# Patient Record
Sex: Female | Born: 1990 | Race: White | Hispanic: No | Marital: Single | Smoking: Former smoker
Health system: Southern US, Community
[De-identification: ages and names within clinical notes are randomized; demographics above are authoritative.]

## PROBLEM LIST (undated history)

## (undated) ENCOUNTER — Inpatient Hospital Stay (HOSPITAL_COMMUNITY): Payer: Self-pay

## (undated) DIAGNOSIS — N200 Calculus of kidney: Secondary | ICD-10-CM

## (undated) DIAGNOSIS — F3173 Bipolar disorder, in partial remission, most recent episode manic: Secondary | ICD-10-CM

## (undated) DIAGNOSIS — IMO0002 Reserved for concepts with insufficient information to code with codable children: Secondary | ICD-10-CM

## (undated) DIAGNOSIS — E039 Hypothyroidism, unspecified: Secondary | ICD-10-CM

## (undated) DIAGNOSIS — F32A Depression, unspecified: Secondary | ICD-10-CM

## (undated) DIAGNOSIS — G43909 Migraine, unspecified, not intractable, without status migrainosus: Secondary | ICD-10-CM

## (undated) DIAGNOSIS — K449 Diaphragmatic hernia without obstruction or gangrene: Secondary | ICD-10-CM

## (undated) DIAGNOSIS — J45909 Unspecified asthma, uncomplicated: Secondary | ICD-10-CM

## (undated) DIAGNOSIS — F419 Anxiety disorder, unspecified: Secondary | ICD-10-CM

## (undated) DIAGNOSIS — K219 Gastro-esophageal reflux disease without esophagitis: Secondary | ICD-10-CM

## (undated) DIAGNOSIS — F329 Major depressive disorder, single episode, unspecified: Secondary | ICD-10-CM

## (undated) DIAGNOSIS — T7840XA Allergy, unspecified, initial encounter: Secondary | ICD-10-CM

## (undated) HISTORY — PX: TUBAL LIGATION: SHX77

## (undated) HISTORY — DX: Hypothyroidism, unspecified: E03.9

## (undated) HISTORY — PX: DILATION AND CURETTAGE OF UTERUS: SHX78

## (undated) HISTORY — DX: Diaphragmatic hernia without obstruction or gangrene: K44.9

## (undated) HISTORY — PX: ABDOMINAL HYSTERECTOMY: SUR658

## (undated) HISTORY — DX: Migraine, unspecified, not intractable, without status migrainosus: G43.909

## (undated) HISTORY — DX: Anxiety disorder, unspecified: F41.9

## (undated) HISTORY — DX: Allergy, unspecified, initial encounter: T78.40XA

## (undated) HISTORY — DX: Gastro-esophageal reflux disease without esophagitis: K21.9

## (undated) HISTORY — DX: Bipolar disorder, in partial remission, most recent episode manic: F31.73

## (undated) HISTORY — DX: Reserved for concepts with insufficient information to code with codable children: IMO0002

---

## 2010-06-02 ENCOUNTER — Emergency Department (HOSPITAL_BASED_OUTPATIENT_CLINIC_OR_DEPARTMENT_OTHER)
Admission: EM | Admit: 2010-06-02 | Discharge: 2010-06-02 | Payer: Self-pay | Source: Home / Self Care | Admitting: Emergency Medicine

## 2010-06-02 LAB — URINE MICROSCOPIC-ADD ON

## 2010-06-02 LAB — URINALYSIS, ROUTINE W REFLEX MICROSCOPIC
Hgb urine dipstick: NEGATIVE
Protein, ur: NEGATIVE mg/dL
Specific Gravity, Urine: 1.013 (ref 1.005–1.030)
Urine Glucose, Fasting: NEGATIVE mg/dL
pH: 6.5 (ref 5.0–8.0)

## 2010-09-02 ENCOUNTER — Emergency Department (HOSPITAL_BASED_OUTPATIENT_CLINIC_OR_DEPARTMENT_OTHER)
Admission: EM | Admit: 2010-09-02 | Discharge: 2010-09-03 | Disposition: A | Discharge status: Home / Self Care | Payer: Self-pay | Attending: Emergency Medicine | Admitting: Emergency Medicine

## 2010-09-02 DIAGNOSIS — J45909 Unspecified asthma, uncomplicated: Secondary | ICD-10-CM | POA: Insufficient documentation

## 2010-09-02 DIAGNOSIS — N760 Acute vaginitis: Secondary | ICD-10-CM | POA: Insufficient documentation

## 2010-09-03 LAB — URINALYSIS, ROUTINE W REFLEX MICROSCOPIC
Ketones, ur: NEGATIVE mg/dL
Nitrite: NEGATIVE
Protein, ur: NEGATIVE mg/dL
Urobilinogen, UA: 0.2 mg/dL (ref 0.0–1.0)

## 2010-09-03 LAB — WET PREP, GENITAL: Trich, Wet Prep: NONE SEEN

## 2010-09-03 LAB — URINE MICROSCOPIC-ADD ON

## 2010-09-04 LAB — GC/CHLAMYDIA PROBE AMP, GENITAL
Chlamydia, DNA Probe: NEGATIVE
GC Probe Amp, Genital: NEGATIVE

## 2010-12-28 ENCOUNTER — Encounter: Payer: Self-pay | Admitting: Student

## 2010-12-28 ENCOUNTER — Emergency Department (HOSPITAL_BASED_OUTPATIENT_CLINIC_OR_DEPARTMENT_OTHER)
Admission: EM | Admit: 2010-12-28 | Discharge: 2010-12-28 | Disposition: A | Discharge status: Home / Self Care | Payer: Self-pay | Attending: Emergency Medicine | Admitting: Emergency Medicine

## 2010-12-28 DIAGNOSIS — K029 Dental caries, unspecified: Secondary | ICD-10-CM | POA: Insufficient documentation

## 2010-12-28 DIAGNOSIS — K089 Disorder of teeth and supporting structures, unspecified: Secondary | ICD-10-CM | POA: Insufficient documentation

## 2010-12-28 MED ORDER — HYDROCODONE-ACETAMINOPHEN 5-500 MG PO TABS: 2.0000 | ORAL_TABLET | Freq: Four times a day (QID) | ORAL | Status: AC | PRN

## 2010-12-28 MED ORDER — PENICILLIN V POTASSIUM 500 MG PO TABS: 500.0000 mg | ORAL_TABLET | Freq: Four times a day (QID) | ORAL | Status: AC

## 2010-12-28 NOTE — ED Notes (Signed)
toothache

## 2010-12-28 NOTE — ED Notes (Signed)
Pt reports right lower molar pain x2-3days. Taking ibuprofen without relief. Called Dentist and is unable to get an appointment for another few weeks. Denies fever or swelling.

## 2010-12-28 NOTE — ED Notes (Signed)
Toothache to right lower molar

## 2010-12-28 NOTE — ED Provider Notes (Signed)
History     CSN: [id number] Arrival date & time: 12/28/2010  9:56 PM  Chief Complaint  Patient presents with  . Dental Pain   Patient is a 20 y.o. female presenting with tooth pain. The history is provided by the patient. No language interpreter was used.  Dental PainThe primary symptoms include dental injury. The symptoms began 3 to 5 days ago. The symptoms are worsening. The symptoms are new. The symptoms occur constantly.  Additional symptoms include: dental sensitivity to temperature and gum swelling. Additional symptoms do not include: gum tenderness, purulent gums, jaw pain, facial swelling, trouble swallowing, pain with swallowing, excessive salivation, dry mouth, taste disturbance and smell disturbance. Medical issues do not include: alcohol problem and immunosuppression.    History reviewed. No pertinent past medical history.  Past Surgical History  Procedure Date  . Cesarean section     History reviewed. No pertinent family history.  History  Substance Use Topics  . Smoking status: Never Smoker   . Smokeless tobacco: Not on file  . Alcohol Use: No    OB History    Grav Para Term Preterm Abortions TAB SAB Ect Mult Living                  Review of Systems  Constitutional: Negative.   HENT: Negative for facial swelling and trouble swallowing.   Eyes: Negative.   Respiratory: Negative for apnea.   Cardiovascular: Negative for chest pain.  Gastrointestinal: Negative for abdominal distention.  Genitourinary: Negative.   Musculoskeletal: Negative for arthralgias.  Skin: Negative.   Neurological: Negative.   Hematological: Negative.   Psychiatric/Behavioral: Negative.     Physical Exam  BP 118/78  Pulse 65  Temp(Src) 99.3 F (37.4 C) (Oral)  Resp 18  SpO2 100%  LMP 12/26/2010  Physical Exam  Constitutional: She is oriented to person, place, and time. She appears well-developed and well-nourished. No distress.  HENT:  Head: Normocephalic and atraumatic.   Mouth/Throat: Oropharynx is clear and moist. Dental caries present.    Eyes: Pupils are equal, round, and reactive to light.  Neck: Normal range of motion. Neck supple. No tracheal deviation present.  Cardiovascular: Normal rate and regular rhythm.   Pulmonary/Chest: Effort normal and breath sounds normal. No respiratory distress.  Abdominal: Soft. Bowel sounds are normal. She exhibits no distension.  Musculoskeletal: Normal range of motion.  Lymphadenopathy:    She has no cervical adenopathy.  Neurological: She is alert and oriented to person, place, and time.  Skin: Skin is warm and dry.  Psychiatric: She has a normal mood and affect.    ED Course  Procedures  MDM Previous visit reviewed.  Follow up with dentist and use barrier contraception while on antibiotics.  Return for fever chills or facial swelling or inability to swallow or any concerns.  Patient verbalizes understanding     Clete Kuch K Parris Signer-Rasch, MD 12/28/10 2308

## 2012-02-03 ENCOUNTER — Emergency Department (HOSPITAL_BASED_OUTPATIENT_CLINIC_OR_DEPARTMENT_OTHER)
Admission: EM | Admit: 2012-02-03 | Discharge: 2012-02-03 | Disposition: A | Discharge status: Home / Self Care | Payer: Medicaid Other | Attending: Emergency Medicine | Admitting: Emergency Medicine

## 2012-02-03 ENCOUNTER — Emergency Department (HOSPITAL_BASED_OUTPATIENT_CLINIC_OR_DEPARTMENT_OTHER): Payer: Medicaid Other

## 2012-02-03 ENCOUNTER — Encounter (HOSPITAL_BASED_OUTPATIENT_CLINIC_OR_DEPARTMENT_OTHER): Payer: Self-pay | Admitting: *Deleted

## 2012-02-03 DIAGNOSIS — H9209 Otalgia, unspecified ear: Secondary | ICD-10-CM | POA: Insufficient documentation

## 2012-02-03 DIAGNOSIS — J40 Bronchitis, not specified as acute or chronic: Secondary | ICD-10-CM

## 2012-02-03 MED ORDER — BENZONATATE 100 MG PO CAPS: 100.0000 mg | ORAL_CAPSULE | Freq: Three times a day (TID) | ORAL | Status: DC

## 2012-02-03 NOTE — ED Provider Notes (Signed)
History     CSN: [id number]  Arrival date & time 02/03/12  2109   First MD Initiated Contact with Patient 02/03/12 2158      Chief Complaint  Patient presents with  . Otalgia    (Consider location/radiation/quality/duration/timing/severity/associated sxs/prior treatment) HPI Pt reports cough and nasal congestion ongoing for the last several days not improved with over the counter medications. Now associated with L sided ear pain. No drainage from ear. Subjective fever at home. No vomiting or diarrhea.   History reviewed. No pertinent past medical history.  Past Surgical History  Procedure Date  . Cesarean section     No family history on file.  History  Substance Use Topics  . Smoking status: Never Smoker   . Smokeless tobacco: Not on file  . Alcohol Use: No    OB History    Grav Para Term Preterm Abortions TAB SAB Ect Mult Living                  Review of Systems All other systems reviewed and are negative except as noted in HPI.   Allergies  Review of patient's allergies indicates no known allergies.  Home Medications   Current Outpatient Rx  Name Route Sig Dispense Refill  . CALCIUM CARBONATE-VITAMIN D 600-400 MG-UNIT PO TABS Oral Take 1 tablet by mouth daily.      . IBUPROFEN 800 MG PO TABS Oral Take 800 mg by mouth every 8 (eight) hours as needed. pain       BP 130/67  Pulse 96  Temp 97.9 F (36.6 C) (Oral)  Resp 22  SpO2 100%  LMP 01/13/2012  Physical Exam  Nursing note and vitals reviewed. Constitutional: She is oriented to person, place, and time. She appears well-developed and well-nourished.  HENT:  Head: Normocephalic and atraumatic.  Right Ear: Tympanic membrane and ear canal normal.  Left Ear: Tympanic membrane and ear canal normal.  Eyes: EOM are normal. Pupils are equal, round, and reactive to light.  Neck: Normal range of motion. Neck supple.  Cardiovascular: Normal rate, normal heart sounds and intact distal pulses.     Pulmonary/Chest: Effort normal and breath sounds normal.  Abdominal: Bowel sounds are normal. She exhibits no distension. There is no tenderness.  Musculoskeletal: Normal range of motion. She exhibits no edema and no tenderness.  Neurological: She is alert and oriented to person, place, and time. She has normal strength. No cranial nerve deficit or sensory deficit.  Skin: Skin is warm and dry. No rash noted.  Psychiatric: She has a normal mood and affect.    ED Course  Procedures (including critical care time)  Labs Reviewed - No data to display Dg Chest 2 View  02/03/2012  *RADIOLOGY REPORT*  Clinical Data: Coughing congestion  CHEST - 2 VIEW  Comparison: Chest radiograph 01/25 1012  Findings: Normal mediastinum and cardiac silhouette.  Normal pulmonary  vasculature.  No evidence of effusion, infiltrate, or pneumothorax.  No acute bony abnormality.  IMPRESSION: No acute cardiopulmonary process.   Original Report Authenticated By: Genevive Bi, M.D.      No diagnosis found.    MDM  CXR neg as above. Likely a viral bronchitis. Advised to continue with supportive measures. Tessalon perles for cough. PCP followup as needed.         Swigert B. Bernette Mayers, MD 02/03/12 2318

## 2012-02-03 NOTE — ED Notes (Signed)
Earache. Cough and congestion x 10 days.

## 2013-01-11 ENCOUNTER — Encounter (HOSPITAL_BASED_OUTPATIENT_CLINIC_OR_DEPARTMENT_OTHER): Payer: Self-pay | Admitting: Emergency Medicine

## 2013-01-11 ENCOUNTER — Emergency Department (HOSPITAL_BASED_OUTPATIENT_CLINIC_OR_DEPARTMENT_OTHER)
Admission: EM | Admit: 2013-01-11 | Discharge: 2013-01-11 | Disposition: A | Discharge status: Home / Self Care | Payer: Medicaid Other | Attending: Emergency Medicine | Admitting: Emergency Medicine

## 2013-01-11 DIAGNOSIS — R093 Abnormal sputum: Secondary | ICD-10-CM | POA: Insufficient documentation

## 2013-01-11 DIAGNOSIS — Z3202 Encounter for pregnancy test, result negative: Secondary | ICD-10-CM | POA: Insufficient documentation

## 2013-01-11 DIAGNOSIS — Z8659 Personal history of other mental and behavioral disorders: Secondary | ICD-10-CM | POA: Insufficient documentation

## 2013-01-11 DIAGNOSIS — R0982 Postnasal drip: Secondary | ICD-10-CM

## 2013-01-11 DIAGNOSIS — J45909 Unspecified asthma, uncomplicated: Secondary | ICD-10-CM | POA: Insufficient documentation

## 2013-01-11 DIAGNOSIS — J3489 Other specified disorders of nose and nasal sinuses: Secondary | ICD-10-CM | POA: Insufficient documentation

## 2013-01-11 DIAGNOSIS — Z8709 Personal history of other diseases of the respiratory system: Secondary | ICD-10-CM | POA: Insufficient documentation

## 2013-01-11 DIAGNOSIS — R509 Fever, unspecified: Secondary | ICD-10-CM | POA: Insufficient documentation

## 2013-01-11 HISTORY — DX: Depression, unspecified: F32.A

## 2013-01-11 HISTORY — DX: Unspecified asthma, uncomplicated: J45.909

## 2013-01-11 HISTORY — DX: Major depressive disorder, single episode, unspecified: F32.9

## 2013-01-11 LAB — PREGNANCY, URINE: Preg Test, Ur: NEGATIVE

## 2013-01-11 MED ORDER — IBUPROFEN 800 MG PO TABS
800.0000 mg | ORAL_TABLET | Freq: Once | ORAL | Status: AC
  Administered 2013-01-11: 800 mg via ORAL
  Filled 2013-01-11: qty 1

## 2013-01-11 MED ORDER — FLUTICASONE PROPIONATE 50 MCG/ACT NA SUSP: 2.0000 | Freq: Every day | NASAL | Status: DC

## 2013-01-11 MED ORDER — AZITHROMYCIN 250 MG PO TABS: ORAL_TABLET | ORAL | Status: DC

## 2013-01-11 NOTE — ED Provider Notes (Signed)
CSN: [id number]     Arrival date & time 01/11/13  0004 History   First MD Initiated Contact with Patient 01/11/13 0033     Chief Complaint  Patient presents with  . Cough  . Fever   (Consider location/radiation/quality/duration/timing/severity/associated sxs/prior Treatment) Patient is a 22 y.o. female presenting with cough. The history is provided by the patient.  Cough Cough characteristics:  Productive Sputum characteristics:  Yellow Severity:  Mild Onset quality:  Gradual Timing:  Constant Progression:  Unchanged Chronicity:  New Smoker: no   Context: upper respiratory infection   Relieved by:  Nothing Worsened by:  Nothing tried Ineffective treatments:  Cough suppressants Associated symptoms: sinus congestion   Associated symptoms: no shortness of breath and no sore throat   Risk factors: no chemical exposure     Past Medical History  Diagnosis Date  . Depression   . Asthma   . Bronchitis    Past Surgical History  Procedure Laterality Date  . Cesarean section    . Dilation and curettage of uterus     No family history on file. History  Substance Use Topics  . Smoking status: Never Smoker   . Smokeless tobacco: Not on file  . Alcohol Use: No   OB History   Grav Para Term Preterm Abortions TAB SAB Ect Mult Living                 Review of Systems  HENT: Positive for congestion. Negative for sore throat, drooling, neck pain, neck stiffness and voice change.   Respiratory: Positive for cough. Negative for shortness of breath.   All other systems reviewed and are negative.    Allergies  Review of patient's allergies indicates no known allergies.  Home Medications   Current Outpatient Rx  Name  Route  Sig  Dispense  Refill  . benzonatate (TESSALON) 100 MG capsule   Oral   Take 1 capsule (100 mg total) by mouth every 8 (eight) hours.   21 capsule   0   . Calcium Carbonate-Vitamin D (CALTRATE 600+D) 600-400 MG-UNIT per tablet   Oral   Take 1 tablet  by mouth daily.           Marland Kitchen ibuprofen (ADVIL,MOTRIN) 800 MG tablet   Oral   Take 800 mg by mouth every 8 (eight) hours as needed. pain           BP 117/73  Pulse 87  Temp(Src) 98.1 F (36.7 C) (Oral)  Resp 16  Ht 5\' 3"  (1.6 m)  Wt 230 lb (104.327 kg)  BMI 40.75 kg/m2  SpO2 99%  LMP 12/14/2012 Physical Exam  Constitutional: She is oriented to person, place, and time. She appears well-developed and well-nourished.  HENT:  Head: Normocephalic and atraumatic.  Mouth/Throat: No oropharyngeal exudate.  Slightly turbid post nasal drainage with cobblestoning  Eyes: EOM are normal. Pupils are equal, round, and reactive to light.  Neck: Normal range of motion. Neck supple.  Cardiovascular: Normal rate, regular rhythm and intact distal pulses.   Pulmonary/Chest: Effort normal and breath sounds normal. No respiratory distress. She has no wheezes. She has no rales.  Abdominal: Soft. Bowel sounds are normal. There is no tenderness. There is no rebound and no guarding.  Musculoskeletal: Normal range of motion.  Neurological: She is alert and oriented to person, place, and time.  Skin: Skin is warm and dry.  Psychiatric: She has a normal mood and affect.    ED Course  Procedures (including critical care  time) Labs Review Labs Reviewed  PREGNANCY, URINE   Imaging Review No results found.  MDM  No diagnosis found. Will treat for postnasal drip    Jakeline Dave K Emalynn Clewis-Rasch, MD 01/11/13 [phone]

## 2013-01-11 NOTE — ED Notes (Signed)
Productive cough x2 days.  Pt sts she coughs so much it makes her vomit.  Now chest and back hurting.

## 2014-10-10 ENCOUNTER — Encounter (HOSPITAL_BASED_OUTPATIENT_CLINIC_OR_DEPARTMENT_OTHER): Payer: Self-pay | Admitting: *Deleted

## 2014-10-10 ENCOUNTER — Emergency Department (HOSPITAL_BASED_OUTPATIENT_CLINIC_OR_DEPARTMENT_OTHER)
Admission: EM | Admit: 2014-10-10 | Discharge: 2014-10-10 | Disposition: A | Discharge status: Home / Self Care | Payer: Medicaid Other | Attending: Emergency Medicine | Admitting: Emergency Medicine

## 2014-10-10 DIAGNOSIS — J029 Acute pharyngitis, unspecified: Secondary | ICD-10-CM | POA: Diagnosis present

## 2014-10-10 DIAGNOSIS — Z793 Long term (current) use of hormonal contraceptives: Secondary | ICD-10-CM | POA: Diagnosis not present

## 2014-10-10 DIAGNOSIS — Z79899 Other long term (current) drug therapy: Secondary | ICD-10-CM | POA: Diagnosis not present

## 2014-10-10 DIAGNOSIS — Z7952 Long term (current) use of systemic steroids: Secondary | ICD-10-CM | POA: Insufficient documentation

## 2014-10-10 DIAGNOSIS — J45909 Unspecified asthma, uncomplicated: Secondary | ICD-10-CM | POA: Diagnosis not present

## 2014-10-10 DIAGNOSIS — K088 Other specified disorders of teeth and supporting structures: Secondary | ICD-10-CM | POA: Insufficient documentation

## 2014-10-10 DIAGNOSIS — K0889 Other specified disorders of teeth and supporting structures: Secondary | ICD-10-CM

## 2014-10-10 DIAGNOSIS — F329 Major depressive disorder, single episode, unspecified: Secondary | ICD-10-CM | POA: Insufficient documentation

## 2014-10-10 DIAGNOSIS — K029 Dental caries, unspecified: Secondary | ICD-10-CM | POA: Insufficient documentation

## 2014-10-10 MED ORDER — PENICILLIN V POTASSIUM 500 MG PO TABS: 500.0000 mg | ORAL_TABLET | Freq: Three times a day (TID) | ORAL | Status: DC

## 2014-10-10 MED ORDER — HYDROCODONE-ACETAMINOPHEN 5-325 MG PO TABS: 1.0000 | ORAL_TABLET | Freq: Four times a day (QID) | ORAL | Status: DC | PRN

## 2014-10-10 NOTE — ED Provider Notes (Signed)
CSN: [id number]     Arrival date & time 10/10/14  1812 History  This chart was scribed for Geoffery Lyonsouglas Adamae Ricklefs, MD by Bronson CurbJacqueline Melvin, ED Scribe. This patient was seen in room MH04/MH04 and the patient's care was started at 6:46 PM.   Chief Complaint  Patient presents with  . Sore Throat    The history is provided by the patient. No language interpreter was used.     HPI Comments: Tomma LightningBrittney N Mattison is a 24 y.o. female who presents to the Emergency Department complaining of constant, moderate sore throat with associated right sided facial pain that began a few days ago. She has tried 800mg  ibuprofen 2 hours ago without relief. Patient reports history of similar intermittent pain but states this episode is worse. Patient states she has a few decaying teeth, however, she has a dental appointment scheduled for next week and does not believe this pain to be related to her teeth. Patient is currently takes Levothyroxine and Vitamin D. She denies any other symptoms.  Past Medical History  Diagnosis Date  . Depression   . Asthma   . Bronchitis   . Depression    Past Surgical History  Procedure Laterality Date  . Cesarean section    . Dilation and curettage of uterus     No family history on file. History  Substance Use Topics  . Smoking status: Never Smoker   . Smokeless tobacco: Not on file  . Alcohol Use: No   OB History    No data available     Review of Systems  A complete 10 system review of systems was obtained and all systems are negative except as noted in the HPI and PMH.    Allergies  Review of patient's allergies indicates no known allergies.  Home Medications   Prior to Admission medications   Medication Sig Start Date End Date Taking? Authorizing Provider  Desvenlafaxine Succinate (PRISTIQ PO) Take by mouth.   Yes Historical Provider, MD  etonogestrel (NEXPLANON) 68 MG IMPL implant 1 each by Subdermal route once.   Yes Historical Provider, MD  Levothyroxine Sodium  (SYNTHROID PO) Take by mouth.   Yes Historical Provider, MD  azithromycin (ZITHROMAX Z-PAK) 250 MG tablet 2 po day one, then 1 daily x 4 days 01/11/13   April Palumbo, MD  benzonatate (TESSALON) 100 MG capsule Take 1 capsule (100 mg total) by mouth every 8 (eight) hours. 02/03/12   Susy Frizzleharles Sheldon, MD  Calcium Carbonate-Vitamin D (CALTRATE 600+D) 600-400 MG-UNIT per tablet Take 1 tablet by mouth daily.      Historical Provider, MD  fluticasone (FLONASE) 50 MCG/ACT nasal spray Place 2 sprays into the nose daily. 01/11/13   April Palumbo, MD  ibuprofen (ADVIL,MOTRIN) 800 MG tablet Take 800 mg by mouth every 8 (eight) hours as needed. pain     Historical Provider, MD   Triage Vitals: BP 122/88 mmHg  Pulse 90  Temp(Src) 98.8 F (37.1 C) (Oral)  Resp 18  Ht 5\' 3"  (1.6 m)  Wt 218 lb (98.884 kg)  BMI 38.63 kg/m2  SpO2 100%  LMP 10/10/2014  Physical Exam  Constitutional: She is oriented to person, place, and time. She appears well-developed and well-nourished. No distress.  HENT:  Head: Normocephalic and atraumatic.  Mouth/Throat: No dental abscesses.  There is tenderness to the right mandible. There are several decayed teeth with fillings present in the right lower jaw. There is no obvious abscess or significant swelling.  Eyes: Conjunctivae and EOM are normal.  Neck: Neck supple. No tracheal deviation present.  Cardiovascular: Normal rate.   Pulmonary/Chest: Effort normal. No respiratory distress.  Musculoskeletal: Normal range of motion.  Neurological: She is alert and oriented to person, place, and time.  Skin: Skin is warm and dry.  Psychiatric: She has a normal mood and affect. Her behavior is normal.  Nursing note and vitals reviewed.   ED Course  Procedures (including critical care time)  DIAGNOSTIC STUDIES: Oxygen Saturation is 100% on room air, normal by my interpretation.    COORDINATION OF CARE: At 1849 Discussed treatment plan with patient which includes ABX and pain  medication. Patient agrees.   Labs Review Labs Reviewed - No data to display  Imaging Review No results found.   EKG Interpretation None      MDM   Final diagnoses:  None    I suspect a dental etiology to her discomfort. We'll treat with anti-biotics, pain meds, and follow-up with dentistry.  I personally performed the services described in this documentation, which was scribed in my presence. The recorded information has been reviewed and is accurate.      Geoffery Lyons, MD 10/10/14 [phone]

## 2014-10-10 NOTE — Discharge Instructions (Signed)
Penicillin as prescribed.  Hydrocodone as prescribed as needed for pain.  All up with your dentist as scheduled next week, and return to the ER if symptoms significantly worsen or change.   Dental Pain A tooth ache may be caused by cavities (tooth decay). Cavities expose the nerve of the tooth to air and hot or cold temperatures. It may come from an infection or abscess (also called a boil or furuncle) around your tooth. It is also often caused by dental caries (tooth decay). This causes the pain you are having. DIAGNOSIS  Your caregiver can diagnose this problem by exam. TREATMENT   If caused by an infection, it may be treated with medications which kill germs (antibiotics) and pain medications as prescribed by your caregiver. Take medications as directed.  Only take over-the-counter or prescription medicines for pain, discomfort, or fever as directed by your caregiver.  Whether the tooth ache today is caused by infection or dental disease, you should see your dentist as soon as possible for further care. SEEK MEDICAL CARE IF: The exam and treatment you received today has been provided on an emergency basis only. This is not a substitute for complete medical or dental care. If your problem worsens or new problems (symptoms) appear, and you are unable to meet with your dentist, call or return to this location. SEEK IMMEDIATE MEDICAL CARE IF:   You have a fever.  You develop redness and swelling of your face, jaw, or neck.  You are unable to open your mouth.  You have severe pain uncontrolled by pain medicine. MAKE SURE YOU:   Understand these instructions.  Will watch your condition.  Will get help right away if you are not doing well or get worse. Document Released: 04/25/2005 Document Revised: 07/18/2011 Document Reviewed: 12/12/2007 [facility] Patient Information 2015 [employer] [place] [place]. This information is not intended to replace advice given to you by your health care provider.  Make sure you discuss any questions you have with your health care provider.

## 2014-10-10 NOTE — ED Notes (Signed)
Sore throat, facial pain for a week.

## 2014-12-24 DIAGNOSIS — E039 Hypothyroidism, unspecified: Secondary | ICD-10-CM | POA: Insufficient documentation

## 2015-02-13 ENCOUNTER — Emergency Department (HOSPITAL_COMMUNITY)
Admission: EM | Admit: 2015-02-13 | Discharge: 2015-02-14 | Disposition: A | Discharge status: Home / Self Care | Payer: Medicaid Other | Attending: Emergency Medicine | Admitting: Emergency Medicine

## 2015-02-13 ENCOUNTER — Emergency Department (HOSPITAL_COMMUNITY): Payer: Medicaid Other

## 2015-02-13 ENCOUNTER — Encounter (HOSPITAL_COMMUNITY): Payer: Self-pay | Admitting: Emergency Medicine

## 2015-02-13 DIAGNOSIS — Z87442 Personal history of urinary calculi: Secondary | ICD-10-CM | POA: Diagnosis not present

## 2015-02-13 DIAGNOSIS — Z3202 Encounter for pregnancy test, result negative: Secondary | ICD-10-CM | POA: Insufficient documentation

## 2015-02-13 DIAGNOSIS — K8050 Calculus of bile duct without cholangitis or cholecystitis without obstruction: Secondary | ICD-10-CM | POA: Insufficient documentation

## 2015-02-13 DIAGNOSIS — Z79899 Other long term (current) drug therapy: Secondary | ICD-10-CM | POA: Diagnosis not present

## 2015-02-13 DIAGNOSIS — J45909 Unspecified asthma, uncomplicated: Secondary | ICD-10-CM | POA: Diagnosis not present

## 2015-02-13 DIAGNOSIS — K8020 Calculus of gallbladder without cholecystitis without obstruction: Secondary | ICD-10-CM

## 2015-02-13 DIAGNOSIS — Z8659 Personal history of other mental and behavioral disorders: Secondary | ICD-10-CM | POA: Diagnosis not present

## 2015-02-13 DIAGNOSIS — R109 Unspecified abdominal pain: Secondary | ICD-10-CM

## 2015-02-13 DIAGNOSIS — R1011 Right upper quadrant pain: Secondary | ICD-10-CM | POA: Diagnosis present

## 2015-02-13 HISTORY — DX: Calculus of kidney: N20.0

## 2015-02-13 LAB — COMPREHENSIVE METABOLIC PANEL
ALK PHOS: 78 U/L (ref 38–126)
ALT: 14 U/L (ref 14–54)
ANION GAP: 6 (ref 5–15)
AST: 19 U/L (ref 15–41)
Albumin: 4.4 g/dL (ref 3.5–5.0)
BILIRUBIN TOTAL: 0.5 mg/dL (ref 0.3–1.2)
BUN: 19 mg/dL (ref 6–20)
CALCIUM: 9.4 mg/dL (ref 8.9–10.3)
CO2: 25 mmol/L (ref 22–32)
Chloride: 107 mmol/L (ref 101–111)
Creatinine, Ser: 0.81 mg/dL (ref 0.44–1.00)
Glucose, Bld: 84 mg/dL (ref 65–99)
Potassium: 3.9 mmol/L (ref 3.5–5.1)
SODIUM: 138 mmol/L (ref 135–145)
TOTAL PROTEIN: 7.4 g/dL (ref 6.5–8.1)

## 2015-02-13 LAB — URINALYSIS, ROUTINE W REFLEX MICROSCOPIC
BILIRUBIN URINE: NEGATIVE
Glucose, UA: NEGATIVE mg/dL
Ketones, ur: NEGATIVE mg/dL
Leukocytes, UA: NEGATIVE
NITRITE: NEGATIVE
PH: 5.5 (ref 5.0–8.0)
Protein, ur: NEGATIVE mg/dL
SPECIFIC GRAVITY, URINE: 1.034 — AB (ref 1.005–1.030)
Urobilinogen, UA: 0.2 mg/dL (ref 0.0–1.0)

## 2015-02-13 LAB — URINE MICROSCOPIC-ADD ON

## 2015-02-13 LAB — CBC WITH DIFFERENTIAL/PLATELET
Basophils Absolute: 0.1 10*3/uL (ref 0.0–0.1)
Basophils Relative: 1 %
EOS ABS: 0.8 10*3/uL — AB (ref 0.0–0.7)
Eosinophils Relative: 8 %
HCT: 41.8 % (ref 36.0–46.0)
HEMOGLOBIN: 13.6 g/dL (ref 12.0–15.0)
LYMPHS ABS: 3.5 10*3/uL (ref 0.7–4.0)
Lymphocytes Relative: 34 %
MCH: 27.3 pg (ref 26.0–34.0)
MCHC: 32.5 g/dL (ref 30.0–36.0)
MCV: 83.8 fL (ref 78.0–100.0)
MONOS PCT: 7 %
Monocytes Absolute: 0.7 10*3/uL (ref 0.1–1.0)
NEUTROS PCT: 50 %
Neutro Abs: 5.2 10*3/uL (ref 1.7–7.7)
Platelets: 296 10*3/uL (ref 150–400)
RBC: 4.99 MIL/uL (ref 3.87–5.11)
RDW: 14.9 % (ref 11.5–15.5)
WBC: 10.1 10*3/uL (ref 4.0–10.5)

## 2015-02-13 LAB — POC URINE PREG, ED: Preg Test, Ur: NEGATIVE

## 2015-02-13 MED ORDER — SODIUM CHLORIDE 0.9 % IV BOLUS (SEPSIS)
1000.0000 mL | Freq: Once | INTRAVENOUS | Status: AC
  Administered 2015-02-13: 1000 mL via INTRAVENOUS

## 2015-02-13 MED ORDER — ONDANSETRON HCL 4 MG/2ML IJ SOLN
4.0000 mg | Freq: Once | INTRAMUSCULAR | Status: AC
  Administered 2015-02-13: 4 mg via INTRAVENOUS
  Filled 2015-02-13: qty 2

## 2015-02-13 MED ORDER — MORPHINE SULFATE (PF) 4 MG/ML IV SOLN
4.0000 mg | Freq: Once | INTRAVENOUS | Status: AC
  Administered 2015-02-13: 4 mg via INTRAVENOUS
  Filled 2015-02-13: qty 1

## 2015-02-13 NOTE — ED Provider Notes (Signed)
CSN: [id number]     Arrival date & time 02/13/15  2124 History   First MD Initiated Contact with Patient 02/13/15 2204     Chief Complaint  Patient presents with  . Cholelithiasis     (Consider location/radiation/quality/duration/timing/severity/associated sxs/prior Treatment) HPI Comments: 3 weeks ago had pain, "all in back and abdomen" Left lower q and RUQ. Constant pain, stabbing at times. Last 2 years having stomache pain after eating Endoscopy back in April to evaluate Heating pad helps a little Ibuprofen doesn't help Baclofen helps a little [facility] had [place] that showed cholelithiasis Saw OBGYN Cyst L Ovary years ago Nexplanon, sexually active     Past Medical History  Diagnosis Date  . Depression   . Asthma   . Bronchitis   . Depression   . Kidney stones    Past Surgical History  Procedure Laterality Date  . Cesarean section    . Dilation and curettage of uterus     History reviewed. No pertinent family history. Social History  Substance Use Topics  . Smoking status: Never Smoker   . Smokeless tobacco: None  . Alcohol Use: No   OB History    No data available     Review of Systems  Constitutional: Negative for fever.  HENT: Negative for sore throat.   Eyes: Negative for visual disturbance.  Respiratory: Negative for cough and shortness of breath.   Cardiovascular: Negative for chest pain.  Gastrointestinal: Positive for nausea, vomiting (vomiting since Tuesday), abdominal pain and diarrhea (3x/day for 4 days). Negative for constipation and blood in stool.  Genitourinary: Negative for dysuria, vaginal bleeding, vaginal discharge and difficulty urinating.  Musculoskeletal: Negative for back pain and neck pain.  Skin: Negative for rash.  Neurological: Negative for syncope and headaches.      Allergies  Review of patient's allergies indicates no known allergies.  Home Medications   Prior to Admission medications   Medication Sig Start Date End Date  Taking? Authorizing Provider  baclofen (LIORESAL) 5 mg TABS tablet Take 5 mg by mouth 2 (two) times daily as needed for muscle spasms.   Yes Historical Provider, MD  busPIRone (BUSPAR) 7.5 MG tablet Take 7.5 mg by mouth 2 (two) times daily.   Yes Historical Provider, MD  Calcium Carbonate-Vitamin D (CALTRATE 600+D) 600-400 MG-UNIT per tablet Take 1 tablet by mouth daily.     Yes Historical Provider, MD  cholecalciferol (VITAMIN D) 1000 UNITS tablet Take 1,000 Units by mouth daily.   Yes Historical Provider, MD  ibuprofen (ADVIL,MOTRIN) 800 MG tablet Take 800 mg by mouth every 8 (eight) hours as needed for moderate pain.   Yes Historical Provider, MD  levothyroxine (SYNTHROID, LEVOTHROID) 25 MCG tablet Take 25 mcg by mouth daily before breakfast.   Yes Historical Provider, MD  omeprazole (PRILOSEC) 20 MG capsule Take 20 mg by mouth daily.   Yes Historical Provider, MD  saccharomyces boulardii (FLORASTOR) 250 MG capsule Take 250 mg by mouth daily.   Yes Historical Provider, MD  azithromycin (ZITHROMAX Z-PAK) 250 MG tablet 2 po day one, then 1 daily x 4 days Patient not taking: Reported on 02/13/2015 01/11/13   April Palumbo, MD  benzonatate (TESSALON) 100 MG capsule Take 1 capsule (100 mg total) by mouth every 8 (eight) hours. Patient not taking: Reported on 02/13/2015 02/03/12   Susy Frizzle, MD  etonogestrel (NEXPLANON) 68 MG IMPL implant 1 each by Subdermal route continuous.     Historical Provider, MD  fluticasone (FLONASE) 50 MCG/ACT nasal spray  Place 2 sprays into the nose daily. Patient not taking: Reported on 02/13/2015 01/11/13   April Palumbo, MD  HYDROcodone-acetaminophen [facility]) 5-325 MG per tablet Take 1-2 tablets by mouth every 6 (six) hours as needed. Patient not taking: Reported on 02/13/2015 10/10/14   Geoffery Lyons, MD  ibuprofen (ADVIL,MOTRIN) 800 MG tablet Take 800 mg by mouth every 8 (eight) hours as needed. pain     Historical Provider, MD  ondansetron (ZOFRAN ODT) 4 MG disintegrating  tablet Take 1 tablet (4 mg total) by mouth every 8 (eight) hours as needed for nausea or vomiting. 02/14/15   Alvira Monday, MD  penicillin v potassium (VEETID) 500 MG tablet Take 1 tablet (500 mg total) by mouth 3 (three) times daily. Patient not taking: Reported on 02/13/2015 10/10/14   Geoffery Lyons, MD   BP 141/91 mmHg  Pulse 113  Temp(Src) 98.5 F (36.9 C) (Oral)  Resp 20  SpO2 100% Physical Exam  Constitutional: She is oriented to person, place, and time. She appears well-developed and well-nourished. No distress.  HENT:  Head: Normocephalic and atraumatic.  Eyes: Conjunctivae and EOM are normal.  Neck: Normal range of motion.  Cardiovascular: Normal rate, regular rhythm, normal heart sounds and intact distal pulses.  Exam reveals no gallop and no friction rub.   No murmur heard. Pulmonary/Chest: Effort normal and breath sounds normal. No respiratory distress. She has no wheezes. She has no rales.  Abdominal: Soft. She exhibits no distension. There is tenderness in the right upper quadrant. There is CVA tenderness (bilateral). There is no rebound, no guarding, no tenderness at McBurney's point and negative Murphy's sign.  Musculoskeletal: She exhibits no edema or tenderness.  Neurological: She is alert and oriented to person, place, and time.  Skin: Skin is warm and dry. No rash noted. She is not diaphoretic. No erythema.  Nursing note and vitals reviewed.   ED Course  Procedures (including critical care time) Labs Review Labs Reviewed  URINALYSIS, ROUTINE W REFLEX MICROSCOPIC (NOT AT [facility]) - Abnormal; Notable for the following:    APPearance CLOUDY (*)    Specific Gravity, Urine 1.034 (*)    Hgb urine dipstick LARGE (*)    All other components within normal limits  CBC WITH DIFFERENTIAL/PLATELET - Abnormal; Notable for the following:    Eosinophils Absolute 0.8 (*)    All other components within normal limits  URINE CULTURE  COMPREHENSIVE METABOLIC PANEL  URINE  MICROSCOPIC-ADD ON  POC URINE PREG, ED    Imaging Review US Abdomen Limited Ruq  02/13/2015   CLINICAL DATA:  Acute onset of right upper quadrant abdominal pain. Initial encounter.  EXAM: US ABDOMEN LIMITED - RIGHT UPPER QUADRANT  COMPARISON:  CT of the abdomen and pelvis performed 06/24/2014  FINDINGS: Gallbladder:  A single stone is noted within the gallbladder, measuring 6 mm in size. The gallbladder is otherwise unremarkable. There is no evidence of gallbladder wall thickening or pericholecystic fluid. No ultrasonographic Murphy's sign is elicited.  Common bile duct:  Diameter: 0.3 cm, within normal limits in caliber.  Liver:  No focal lesion identified. Within normal limits in parenchymal echogenicity.  IMPRESSION: 1. No acute abnormality seen within the right upper quadrant. 2. Cholelithiasis; gallbladder otherwise unremarkable.   Electronically Signed   By: Roanna Raider M.D.   On: 02/13/2015 23:33   I have personally reviewed and evaluated these images and lab results as part of my medical decision-making.   EKG Interpretation None      MDM  Final diagnoses:  Abdominal pain  Calculus of gallbladder without cholecystitis without obstruction   24 year old female with a history of asthma and nephrolithiasis presents with concern of right upper quadrant abdominal pain with recent ultrasound showing cholelithiasis.  Patient describes history of abdominal pain, however this episode of abdominal pain being present over the last 3 weeks, worse after eating.  She had been referred to OB/GYN initially, and had a pelvic exam done days ago which per patient did not show any acute abnormalities. Given this evaluation have low suspicion for PID, tubo-ovarian abscess, ovarian torsion.  Duration and quality of symptoms and exam not consistent with nephrolithiasis. Urinalysis shows no signs of pyelonephritis.  Ultrasound was performed which showed cholelithiasis without evidence of cholecystitis.  Patient has normal AST, AST, alkaline phosphatase and bili, with no leukocytosis and no signs of cholecystitis on ultrasound. Possible etiologies of pain include symptomatic cholelithiasis, especially given patient's pain worse after eating.   Pt referred to outpatient general surgery clinic for concern for symptomatic cholelithiasis.    Alvira Monday, MD 02/14/15 [phone]

## 2015-02-13 NOTE — ED Notes (Signed)
PT reports to ED c/o abdominal pain due to diagnosed gallstones; gallstones were diagnosed 2 days ago; Pt reports pain and N/V/D when attempting to eat or drink this evening; Doctor prescribed  ibuprofen and omeprazole with no relief; pt reports fever for "past couple days"; NAD noted. Pt ambulating, alert, and oriented.

## 2015-02-14 ENCOUNTER — Encounter (HOSPITAL_BASED_OUTPATIENT_CLINIC_OR_DEPARTMENT_OTHER): Payer: Self-pay | Admitting: Emergency Medicine

## 2015-02-14 ENCOUNTER — Emergency Department (HOSPITAL_BASED_OUTPATIENT_CLINIC_OR_DEPARTMENT_OTHER)
Admission: EM | Admit: 2015-02-14 | Discharge: 2015-02-15 | Disposition: A | Discharge status: Home / Self Care | Payer: Medicaid Other | Source: Home / Self Care | Attending: Emergency Medicine | Admitting: Emergency Medicine

## 2015-02-14 DIAGNOSIS — K8050 Calculus of bile duct without cholangitis or cholecystitis without obstruction: Secondary | ICD-10-CM

## 2015-02-14 MED ORDER — MORPHINE SULFATE (PF) 4 MG/ML IV SOLN
4.0000 mg | Freq: Once | INTRAVENOUS | Status: AC
  Administered 2015-02-15: 4 mg via INTRAVENOUS
  Filled 2015-02-14: qty 1

## 2015-02-14 MED ORDER — ONDANSETRON 4 MG PO TBDP: 4.0000 mg | ORAL_TABLET | Freq: Three times a day (TID) | ORAL | Status: DC | PRN

## 2015-02-14 MED ORDER — SODIUM CHLORIDE 0.9 % IV BOLUS (SEPSIS)
1000.0000 mL | Freq: Once | INTRAVENOUS | Status: AC
  Administered 2015-02-15: 1000 mL via INTRAVENOUS

## 2015-02-14 MED ORDER — ONDANSETRON HCL 4 MG/2ML IJ SOLN
4.0000 mg | Freq: Once | INTRAMUSCULAR | Status: AC
  Administered 2015-02-15: 4 mg via INTRAVENOUS
  Filled 2015-02-14: qty 2

## 2015-02-14 NOTE — ED Notes (Signed)
Pt states she was seen recently and diagnosed with gall stones. Stated she was told if pain was not in control to come back to ED. States pain is out of control 10/10. Also reports nausea, vomiting and diarrhea.

## 2015-02-14 NOTE — ED Provider Notes (Signed)
CSN: [id number]     Arrival date & time 02/14/15  2333 History  By signing my name below, I, Faith Beck, attest that this documentation has been prepared under the direction and in the presence of Geoffery Lyons, MD. Electronically Signed: Budd Beck, ED Scribe. 02/15/2015. 12:28 AM.     Chief Complaint  Patient presents with  . Abdominal Pain   Patient is a 24 y.o. female presenting with abdominal pain. The history is provided by the patient. No language interpreter was used.  Abdominal Pain Pain location:  RUQ Pain quality: stabbing   Pain radiates to:  R shoulder Pain severity:  Moderate Onset quality:  Gradual Duration:  3 weeks Timing:  Constant Progression:  Worsening Chronicity:  New Relieved by:  Nothing Ineffective treatments:  NSAIDs Associated symptoms: diarrhea, fever and nausea   Associated symptoms: no dysuria   Fever:    Temp source:  Subjective  HPI Comments: Faith Beck is a 24 y.o. female who presents to the Emergency Department complaining of worsening, stabbing RUQ abdominal pain onset 3 weeks ago. She reports associated mild subjective fever, nausea, and diarrhea. Pt states she was recently diagnosed with gall stones at [facility] yesterday and was told to return to the ED if the pain worsened. She states she was prescribed 800 mg ibuprofen which has not provided sufficient relief. She also reports she was referred to a surgeon, whom she has not yet called. She notes a PSHx of C-sections (2x). Pt denies urinary symptoms.   Past Medical History  Diagnosis Date  . Depression   . Asthma   . Bronchitis   . Depression   . Kidney stones    Past Surgical History  Procedure Laterality Date  . Cesarean section    . Dilation and curettage of uterus     No family history on file. Social History  Substance Use Topics  . Smoking status: Never Smoker   . Smokeless tobacco: None  . Alcohol Use: No   OB History    No data available     Review  of Systems  Constitutional: Positive for fever.  Gastrointestinal: Positive for nausea, abdominal pain and diarrhea.  Genitourinary: Negative for dysuria, frequency and difficulty urinating.  All other systems reviewed and are negative.   Allergies  Review of patient's allergies indicates no known allergies.  Home Medications   Prior to Admission medications   Medication Sig Start Date End Date Taking? Authorizing Provider  azithromycin (ZITHROMAX Z-PAK) 250 MG tablet 2 po day one, then 1 daily x 4 days Patient not taking: Reported on 02/13/2015 01/11/13   April Palumbo, MD  baclofen (LIORESAL) 5 mg TABS tablet Take 5 mg by mouth 2 (two) times daily as needed for muscle spasms.    Historical Provider, MD  benzonatate (TESSALON) 100 MG capsule Take 1 capsule (100 mg total) by mouth every 8 (eight) hours. Patient not taking: Reported on 02/13/2015 02/03/12   Susy Frizzle, MD  busPIRone (BUSPAR) 7.5 MG tablet Take 7.5 mg by mouth 2 (two) times daily.    Historical Provider, MD  Calcium Carbonate-Vitamin D (CALTRATE 600+D) 600-400 MG-UNIT per tablet Take 1 tablet by mouth daily.      Historical Provider, MD  cholecalciferol (VITAMIN D) 1000 UNITS tablet Take 1,000 Units by mouth daily.    Historical Provider, MD  etonogestrel (NEXPLANON) 68 MG IMPL implant 1 each by Subdermal route continuous.     Historical Provider, MD  fluticasone (FLONASE) 50 MCG/ACT nasal spray  Place 2 sprays into the nose daily. Patient not taking: Reported on 02/13/2015 01/11/13   April Palumbo, MD  HYDROcodone-acetaminophen [facility]) 5-325 MG per tablet Take 1-2 tablets by mouth every 6 (six) hours as needed. Patient not taking: Reported on 02/13/2015 10/10/14   Geoffery Lyons, MD  ibuprofen (ADVIL,MOTRIN) 800 MG tablet Take 800 mg by mouth every 8 (eight) hours as needed. pain     Historical Provider, MD  ibuprofen (ADVIL,MOTRIN) 800 MG tablet Take 800 mg by mouth every 8 (eight) hours as needed for moderate pain.    Historical  Provider, MD  levothyroxine (SYNTHROID, LEVOTHROID) 25 MCG tablet Take 25 mcg by mouth daily before breakfast.    Historical Provider, MD  omeprazole (PRILOSEC) 20 MG capsule Take 20 mg by mouth daily.    Historical Provider, MD  ondansetron (ZOFRAN ODT) 4 MG disintegrating tablet Take 1 tablet (4 mg total) by mouth every 8 (eight) hours as needed for nausea or vomiting. 02/14/15   Alvira Monday, MD  penicillin v potassium (VEETID) 500 MG tablet Take 1 tablet (500 mg total) by mouth 3 (three) times daily. Patient not taking: Reported on 02/13/2015 10/10/14   Geoffery Lyons, MD  saccharomyces boulardii (FLORASTOR) 250 MG capsule Take 250 mg by mouth daily.    Historical Provider, MD   BP 116/74 mmHg  Pulse 81  Temp(Src) 98 F (36.7 C) (Oral)  Resp 17  Ht  (1.6 m)  Wt 230 lb (104.327 kg)  BMI 40.75 kg/m2  SpO2 100%  LMP  (LMP Unknown) Physical Exam  Constitutional: She is oriented to person, place, and time. She appears well-developed and well-nourished.  HENT:  Head: Normocephalic.  Eyes: EOM are normal.  Neck: Normal range of motion.  Pulmonary/Chest: Effort normal.  Abdominal: She exhibits no distension. There is tenderness. There is no rebound and no guarding.  TTP in RUQ  Musculoskeletal: Normal range of motion.  Neurological: She is alert and oriented to person, place, and time.  Psychiatric: She has a normal mood and affect.  Nursing note and vitals reviewed.   ED Course  Procedures  DIAGNOSTIC STUDIES: Oxygen Saturation is 100% on RA, normal by my interpretation.    COORDINATION OF CARE: 11:57 PM - Discussed plans to order diagnostic studies as well as anti-nausea and pain medication. Pt advised of plan for treatment and pt agrees.  Labs Review Labs Reviewed  URINALYSIS, ROUTINE W REFLEX MICROSCOPIC (NOT AT [facility]) - Abnormal; Notable for the following:    Hgb urine dipstick LARGE (*)    All other components within normal limits  PREGNANCY, URINE  URINE  MICROSCOPIC-ADD ON  COMPREHENSIVE METABOLIC PANEL  LIPASE, BLOOD  CBC WITH DIFFERENTIAL/PLATELET    Imaging Review US Abdomen Limited Ruq  02/13/2015   CLINICAL DATA:  Acute onset of right upper quadrant abdominal pain. Initial encounter.  EXAM: US ABDOMEN LIMITED - RIGHT UPPER QUADRANT  COMPARISON:  CT of the abdomen and pelvis performed 06/24/2014  FINDINGS: Gallbladder:  A single stone is noted within the gallbladder, measuring 6 mm in size. The gallbladder is otherwise unremarkable. There is no evidence of gallbladder wall thickening or pericholecystic fluid. No ultrasonographic Murphy's sign is elicited.  Common bile duct:  Diameter: 0.3 cm, within normal limits in caliber.  Liver:  No focal lesion identified. Within normal limits in parenchymal echogenicity.  IMPRESSION: 1. No acute abnormality seen within the right upper quadrant. 2. Cholelithiasis; gallbladder otherwise unremarkable.   Electronically Signed   By: Beryle Beams.D.  On: 02/13/2015 23:33   I have personally reviewed and evaluated these images and lab results as part of my medical decision-making.   EKG Interpretation None      MDM   Final diagnoses:  None    Patient with history of recently diagnosed cholelithiasis. She returns this evening stating that the medication she was instructed to take his ineffective for her pain. She denies any fevers or chills. She denies any vomiting but does report feeling nauseated. Her pain is worse after eating.  Workup tonight reveals no elevation of white count, no fever, no bump in her LFTs, and normal lipase. She is feeling somewhat better after medications and fluids. I see nothing tonight that appears emergent. She will be given pain medication and advised to follow-up with [facility] [place] [facility] on Monday. She is to call to arrange this appointment.  Roney Jaffe, personally performed the services described in this documentation. All medical record entries made by the  scribe were at my direction and in my presence.  I have reviewed the chart and discharge instructions and agree that the record reflects my personal performance and is accurate and complete. Geoffery Lyons.  02/15/2015. 1:25 AM.       Geoffery Lyons, MD 02/15/15 [phone]

## 2015-02-15 LAB — COMPREHENSIVE METABOLIC PANEL
ALT: 12 U/L — ABNORMAL LOW (ref 14–54)
ANION GAP: 4 — AB (ref 5–15)
AST: 15 U/L (ref 15–41)
Albumin: 3.9 g/dL (ref 3.5–5.0)
Alkaline Phosphatase: 73 U/L (ref 38–126)
BUN: 14 mg/dL (ref 6–20)
CHLORIDE: 108 mmol/L (ref 101–111)
CO2: 25 mmol/L (ref 22–32)
Calcium: 9.2 mg/dL (ref 8.9–10.3)
Creatinine, Ser: 0.77 mg/dL (ref 0.44–1.00)
GFR calc non Af Amer: >60 mL/min (ref >60)
Glucose, Bld: 93 mg/dL (ref 65–99)
POTASSIUM: 4 mmol/L (ref 3.5–5.1)
SODIUM: 137 mmol/L (ref 135–145)
Total Bilirubin: 0.3 mg/dL (ref 0.3–1.2)
Total Protein: 7.1 g/dL (ref 6.5–8.1)

## 2015-02-15 LAB — URINE MICROSCOPIC-ADD ON

## 2015-02-15 LAB — URINALYSIS, ROUTINE W REFLEX MICROSCOPIC
BILIRUBIN URINE: NEGATIVE
Glucose, UA: NEGATIVE mg/dL
KETONES UR: NEGATIVE mg/dL
Leukocytes, UA: NEGATIVE
NITRITE: NEGATIVE
Protein, ur: NEGATIVE mg/dL
Specific Gravity, Urine: 1.012 (ref 1.005–1.030)
UROBILINOGEN UA: 0.2 mg/dL (ref 0.0–1.0)
pH: 5.5 (ref 5.0–8.0)

## 2015-02-15 LAB — CBC WITH DIFFERENTIAL/PLATELET
Basophils Absolute: 0.1 10*3/uL (ref 0.0–0.1)
Basophils Relative: 1 %
Eosinophils Absolute: 0.7 10*3/uL (ref 0.0–0.7)
Eosinophils Relative: 8 %
HCT: 40.2 % (ref 36.0–46.0)
HEMOGLOBIN: 13.1 g/dL (ref 12.0–15.0)
LYMPHS ABS: 2.8 10*3/uL (ref 0.7–4.0)
LYMPHS PCT: 31 %
MCH: 27.4 pg (ref 26.0–34.0)
MCHC: 32.6 g/dL (ref 30.0–36.0)
MCV: 84.1 fL (ref 78.0–100.0)
Monocytes Absolute: 0.7 10*3/uL (ref 0.1–1.0)
Monocytes Relative: 7 %
NEUTROS PCT: 53 %
Neutro Abs: 4.8 10*3/uL (ref 1.7–7.7)
Platelets: 275 10*3/uL (ref 150–400)
RBC: 4.78 MIL/uL (ref 3.87–5.11)
RDW: 14.9 % (ref 11.5–15.5)
WBC: 9 10*3/uL (ref 4.0–10.5)

## 2015-02-15 LAB — URINE CULTURE: CULTURE: NO GROWTH

## 2015-02-15 LAB — PREGNANCY, URINE: PREG TEST UR: NEGATIVE

## 2015-02-15 LAB — LIPASE, BLOOD: Lipase: 39 U/L (ref 22–51)

## 2015-02-15 MED ORDER — HYDROCODONE-ACETAMINOPHEN 5-325 MG PO TABS: 1.0000 | ORAL_TABLET | Freq: Four times a day (QID) | ORAL | Status: DC | PRN

## 2015-02-15 NOTE — Discharge Instructions (Signed)
Hydrocodone as prescribed as needed for pain.  Call [facility] [place] [facility] on Monday to arrange a follow-up appointment. The contact information has been provided in this discharge summary.  Return to the ER if you develop worsening pain, high fever, or other new and concerning symptoms.   Cholelithiasis Cholelithiasis (also called gallstones) is a form of gallbladder disease in which gallstones form in your gallbladder. The gallbladder is an organ that stores bile made in the liver, which helps digest fats. Gallstones begin as small crystals and slowly grow into stones. Gallstone pain occurs when the gallbladder spasms and a gallstone is blocking the duct. Pain can also occur when a stone passes out of the duct.  RISK FACTORS  Being female.   Having multiple pregnancies. Health care providers sometimes advise removing diseased gallbladders before future pregnancies.   Being obese.  Eating a diet heavy in fried foods and fat.   Being older than 60 years and increasing age.   Prolonged use of medicines containing female hormones.   Having diabetes mellitus.   Rapidly losing weight.   Having a family history of gallstones (heredity).  SYMPTOMS  Nausea.   Vomiting.  Abdominal pain.   Yellowing of the skin (jaundice).   Sudden pain. It may persist from several minutes to several hours.  Fever.   Tenderness to the touch. In some cases, when gallstones do not move into the bile duct, people have no pain or symptoms. These are called "silent" gallstones.  TREATMENT Silent gallstones do not need treatment. In severe cases, emergency surgery may be required. Options for treatment include:  Surgery to remove the gallbladder. This is the most common treatment.  Medicines. These do not always work and may take 6-12 months or more to work.  Shock wave treatment (extracorporeal biliary lithotripsy). In this treatment an ultrasound machine sends shock waves to the  gallbladder to break gallstones into smaller pieces that can pass into the intestines or be dissolved by medicine. HOME CARE INSTRUCTIONS   Only take over-the-counter or prescription medicines for pain, discomfort, or fever as directed by your health care provider.   Follow a low-fat diet until seen again by your health care provider. Fat causes the gallbladder to contract, which can result in pain.   Follow up with your health care provider as directed. Attacks are almost always recurrent and surgery is usually required for permanent treatment.  SEEK IMMEDIATE MEDICAL CARE IF:   Your pain increases and is not controlled by medicines.   You have a fever or persistent symptoms for more than 2-3 days.   You have a fever and your symptoms suddenly get worse.   You have persistent nausea and vomiting.  MAKE SURE YOU:   Understand these instructions.  Will watch your condition.  Will get help right away if you are not doing well or get worse.   This information is not intended to replace advice given to you by your health care provider. Make sure you discuss any questions you have with your health care provider.   Document Released: 04/21/2005 Document Revised: 12/26/2012 Document Reviewed: 10/17/2012 Elsevier Interactive Patient Education [employer]

## 2015-02-18 ENCOUNTER — Emergency Department (HOSPITAL_BASED_OUTPATIENT_CLINIC_OR_DEPARTMENT_OTHER): Payer: Medicaid Other

## 2015-02-18 ENCOUNTER — Observation Stay (HOSPITAL_BASED_OUTPATIENT_CLINIC_OR_DEPARTMENT_OTHER)
Admission: EM | Admit: 2015-02-18 | Discharge: 2015-02-20 | Disposition: A | Discharge status: Home / Self Care | Payer: Medicaid Other | Attending: Surgery | Admitting: Surgery

## 2015-02-18 ENCOUNTER — Encounter (HOSPITAL_BASED_OUTPATIENT_CLINIC_OR_DEPARTMENT_OTHER): Payer: Self-pay | Admitting: Emergency Medicine

## 2015-02-18 DIAGNOSIS — K8010 Calculus of gallbladder with chronic cholecystitis without obstruction: Principal | ICD-10-CM | POA: Insufficient documentation

## 2015-02-18 DIAGNOSIS — R1011 Right upper quadrant pain: Secondary | ICD-10-CM

## 2015-02-18 DIAGNOSIS — R109 Unspecified abdominal pain: Secondary | ICD-10-CM

## 2015-02-18 DIAGNOSIS — F329 Major depressive disorder, single episode, unspecified: Secondary | ICD-10-CM | POA: Insufficient documentation

## 2015-02-18 DIAGNOSIS — J45909 Unspecified asthma, uncomplicated: Secondary | ICD-10-CM | POA: Diagnosis not present

## 2015-02-18 DIAGNOSIS — Z87442 Personal history of urinary calculi: Secondary | ICD-10-CM | POA: Diagnosis not present

## 2015-02-18 DIAGNOSIS — K819 Cholecystitis, unspecified: Secondary | ICD-10-CM

## 2015-02-18 DIAGNOSIS — F419 Anxiety disorder, unspecified: Secondary | ICD-10-CM | POA: Diagnosis not present

## 2015-02-18 DIAGNOSIS — E039 Hypothyroidism, unspecified: Secondary | ICD-10-CM | POA: Insufficient documentation

## 2015-02-18 MED ORDER — ONDANSETRON HCL 4 MG/2ML IJ SOLN
4.0000 mg | Freq: Once | INTRAMUSCULAR | Status: AC
  Administered 2015-02-19: 4 mg via INTRAVENOUS
  Filled 2015-02-18: qty 2

## 2015-02-18 MED ORDER — FENTANYL CITRATE (PF) 100 MCG/2ML IJ SOLN
100.0000 ug | Freq: Once | INTRAMUSCULAR | Status: AC
  Administered 2015-02-19: 100 ug via INTRAVENOUS
  Filled 2015-02-18: qty 2

## 2015-02-18 NOTE — ED Provider Notes (Signed)
CSN: [id number]     Arrival date & time 02/18/15  2314 History   First MD Initiated Contact with Patient 02/18/15 2352     Chief Complaint  Patient presents with  . Abdominal Pain     (Consider location/radiation/quality/duration/timing/severity/associated sxs/prior Treatment) HPI  This is a 24 year old female who is here for days ago for abdominal pain and was diagnosed with biliary colic. She was discharged home on Zofran and Vicodin. She has had persistent pain since that visit which acutely worsened this evening about 8 PM. Her symptoms are moderate to severe. The pain is in the right upper quadrant and radiates to the right shoulder. It is worse with movement or palpation. There is been associated multiple episodes of emesis as well as diagonal rare which she describes as "clay colored". She is not aware of having a fever. She has been taking Zofran and Vicodin without relief.  Past Medical History  Diagnosis Date  . Depression   . Asthma   . Bronchitis   . Depression   . Kidney stones    Past Surgical History  Procedure Laterality Date  . Cesarean section    . Dilation and curettage of uterus     History reviewed. No pertinent family history. Social History  Substance Use Topics  . Smoking status: Never Smoker   . Smokeless tobacco: None  . Alcohol Use: No   OB History    No data available     Review of Systems  All other systems reviewed and are negative.   Allergies  Review of patient's allergies indicates no known allergies.  Home Medications   Prior to Admission medications   Medication Sig Start Date End Date Taking? Authorizing Provider  azithromycin (ZITHROMAX Z-PAK) 250 MG tablet 2 po day one, then 1 daily x 4 days Patient not taking: Reported on 02/13/2015 01/11/13   April Palumbo, MD  baclofen (LIORESAL) 5 mg TABS tablet Take 5 mg by mouth 2 (two) times daily as needed for muscle spasms.    Historical Provider, MD  benzonatate (TESSALON) 100 MG capsule  Take 1 capsule (100 mg total) by mouth every 8 (eight) hours. Patient not taking: Reported on 02/13/2015 02/03/12   Susy Frizzle, MD  busPIRone (BUSPAR) 7.5 MG tablet Take 7.5 mg by mouth 2 (two) times daily.    Historical Provider, MD  Calcium Carbonate-Vitamin D (CALTRATE 600+D) 600-400 MG-UNIT per tablet Take 1 tablet by mouth daily.      Historical Provider, MD  cholecalciferol (VITAMIN D) 1000 UNITS tablet Take 1,000 Units by mouth daily.    Historical Provider, MD  etonogestrel (NEXPLANON) 68 MG IMPL implant 1 each by Subdermal route continuous.     Historical Provider, MD  fluticasone (FLONASE) 50 MCG/ACT nasal spray Place 2 sprays into the nose daily. Patient not taking: Reported on 02/13/2015 01/11/13   April Palumbo, MD  HYDROcodone-acetaminophen [facility]) 5-325 MG tablet Take 1-2 tablets by mouth every 6 (six) hours as needed. 02/15/15   Geoffery Lyons, MD  ibuprofen (ADVIL,MOTRIN) 800 MG tablet Take 800 mg by mouth every 8 (eight) hours as needed. pain     Historical Provider, MD  ibuprofen (ADVIL,MOTRIN) 800 MG tablet Take 800 mg by mouth every 8 (eight) hours as needed for moderate pain.    Historical Provider, MD  levothyroxine (SYNTHROID, LEVOTHROID) 25 MCG tablet Take 25 mcg by mouth daily before breakfast.    Historical Provider, MD  omeprazole (PRILOSEC) 20 MG capsule Take 20 mg by mouth daily.  Historical Provider, MD  ondansetron (ZOFRAN ODT) 4 MG disintegrating tablet Take 1 tablet (4 mg total) by mouth every 8 (eight) hours as needed for nausea or vomiting. 02/14/15   Alvira Monday, MD  penicillin v potassium (VEETID) 500 MG tablet Take 1 tablet (500 mg total) by mouth 3 (three) times daily. Patient not taking: Reported on 02/13/2015 10/10/14   Geoffery Lyons, MD  saccharomyces boulardii (FLORASTOR) 250 MG capsule Take 250 mg by mouth daily.    Historical Provider, MD   BP 109/67 mmHg  Pulse 98  Temp(Src) 98.4 F (36.9 C) (Oral)  Resp 16  Ht  (1.6 m)  Wt 230 lb (104.327 kg)   BMI 40.75 kg/m2  SpO2 97%  LMP  (LMP Unknown)   Physical Exam General: Well-developed, well-nourished female in no acute distress; appearance consistent with age of record HENT: normocephalic; atraumatic Eyes: pupils equal, round and reactive to light; extraocular muscles intact Neck: supple Heart: regular rate and rhythm Lungs: clear to auscultation bilaterally Abdomen: soft; nondistended; right upper quadrant tenderness; no masses or hepatosplenomegaly; bowel sounds present Extremities: No deformity; full range of motion; pulses normal Neurologic: Awake, alert and oriented; motor function intact in all extremities and symmetric; no facial droop Skin: Warm and dry Psychiatric: Normal mood and affect    ED Course  Procedures (including critical care time)   MDM  Nursing notes and vitals signs, including pulse oximetry, reviewed.  Summary of this visit's results, reviewed by myself:  Labs:  Results for orders placed or performed during the hospital encounter of 02/18/15 (from the past 24 hour(s))  Comprehensive metabolic panel     Status: Abnormal   Collection Time: 02/19/15  1:15 AM  Result Value Ref Range   Sodium 139 135 - 145 mmol/L   Potassium 4.2 3.5 - 5.1 mmol/L   Chloride 106 101 - 111 mmol/L   CO2 26 22 - 32 mmol/L   Glucose, Bld 101 (H) 65 - 99 mg/dL   BUN 15 6 - 20 mg/dL   Creatinine, Ser 4.09 0.44 - 1.00 mg/dL   Calcium 9.4 8.9 - 81.1 mg/dL   Total Protein 7.9 6.5 - 8.1 g/dL   Albumin 4.5 3.5 - 5.0 g/dL   AST 18 15 - 41 U/L   ALT 14 14 - 54 U/L   Alkaline Phosphatase 78 38 - 126 U/L   Total Bilirubin 0.6 0.3 - 1.2 mg/dL   GFR calc non Af Amer >60 >60 mL/min   GFR calc Af Amer >60 >60 mL/min   Anion gap 7 5 - 15  Lipase, blood     Status: None   Collection Time: 02/19/15  1:15 AM  Result Value Ref Range   Lipase 32 22 - 51 U/L  CBC with Differential/Platelet     Status: Abnormal   Collection Time: 02/19/15  1:15 AM  Result Value Ref Range   WBC  10.2 4.0 - 10.5 K/uL   RBC 4.97 3.87 - 5.11 MIL/uL   Hemoglobin 13.6 12.0 - 15.0 g/dL   HCT 91.4 78.2 - 95.6 %   MCV 83.3 78.0 - 100.0 fL   MCH 27.4 26.0 - 34.0 pg   MCHC 32.9 30.0 - 36.0 g/dL   RDW 21.3 08.6 - 57.8 %   Platelets 271 150 - 400 K/uL   Neutrophils Relative % 47 %   Neutro Abs 4.9 1.7 - 7.7 K/uL   Lymphocytes Relative 34 %   Lymphs Abs 3.5 0.7 - 4.0 K/uL  Monocytes Relative 7 %   Monocytes Absolute 0.7 0.1 - 1.0 K/uL   Eosinophils Relative 11 %   Eosinophils Absolute 1.1 (H) 0.0 - 0.7 K/uL   Basophils Relative 1 %   Basophils Absolute 0.1 0.0 - 0.1 K/uL    Imaging Studies: [place] Abdomen Complete  02/19/2015  CLINICAL DATA:  24 year old female with gallstones. 5 hr of acute right upper quadrant abdominal pain. Low-grade fever. Initial encounter. EXAM: ULTRASOUND ABDOMEN COMPLETE COMPARISON:  02/13/2015. FINDINGS: Gallbladder: Cholelithiasis re- demonstrated, individual stones up to 7 mm diameter. New gallbladder wall thickening and positive sonographic Murphy sign. Common bile duct: Diameter: 2 mm, normal Liver: No focal lesion identified. Within normal limits in parenchymal echogenicity. No intrahepatic biliary ductal dilatation. IVC: No abnormality visualized. Pancreas: Within normal limits. Spleen: Size and appearance within normal limits. Right Kidney: Length: 11.5 cm . Echogenicity within normal limits. No mass or hydronephrosis visualized. Left Kidney: Length: 11.2 cm. Echogenicity within normal limits. No mass or hydronephrosis visualized. Abdominal aorta: No aneurysm visualized. Other findings: None. IMPRESSION: 1. Acute Cholecystitis.  Individual gallstones up to 7 mm. 2. Otherwise negative abdomen ultrasound. Electronically Signed   By: Odessa FlemingH  Hall M.D.   On: 02/19/2015 00:45   2:34 AM [facility] Maisie Fushomas of general surgery.   Paula LibraJohn Morey Andonian, MD 02/19/15 [phone]

## 2015-02-18 NOTE — ED Notes (Signed)
Patient reports that she was here on Saturday - the patient reports that pain and the vomiting coming back. The patient states that the medications helped until about 7 pm today.

## 2015-02-19 ENCOUNTER — Observation Stay (HOSPITAL_COMMUNITY): Payer: Medicaid Other | Admitting: Anesthesiology

## 2015-02-19 ENCOUNTER — Encounter (HOSPITAL_COMMUNITY)
Admission: EM | Disposition: A | Discharge status: Home / Self Care | Payer: Self-pay | Source: Home / Self Care | Attending: Emergency Medicine

## 2015-02-19 ENCOUNTER — Observation Stay (HOSPITAL_COMMUNITY): Payer: Medicaid Other

## 2015-02-19 ENCOUNTER — Encounter (HOSPITAL_COMMUNITY): Payer: Self-pay | Admitting: Surgery

## 2015-02-19 DIAGNOSIS — F329 Major depressive disorder, single episode, unspecified: Secondary | ICD-10-CM | POA: Diagnosis not present

## 2015-02-19 DIAGNOSIS — K8010 Calculus of gallbladder with chronic cholecystitis without obstruction: Secondary | ICD-10-CM | POA: Diagnosis not present

## 2015-02-19 DIAGNOSIS — F419 Anxiety disorder, unspecified: Secondary | ICD-10-CM | POA: Diagnosis not present

## 2015-02-19 DIAGNOSIS — J45909 Unspecified asthma, uncomplicated: Secondary | ICD-10-CM | POA: Diagnosis not present

## 2015-02-19 HISTORY — PX: CHOLECYSTECTOMY: SHX55

## 2015-02-19 LAB — CBC WITH DIFFERENTIAL/PLATELET
BASOS ABS: 0.1 10*3/uL (ref 0.0–0.1)
BASOS PCT: 1 %
EOS ABS: 1.1 10*3/uL — AB (ref 0.0–0.7)
Eosinophils Relative: 11 %
HCT: 41.4 % (ref 36.0–46.0)
HEMOGLOBIN: 13.6 g/dL (ref 12.0–15.0)
Lymphocytes Relative: 34 %
Lymphs Abs: 3.5 10*3/uL (ref 0.7–4.0)
MCH: 27.4 pg (ref 26.0–34.0)
MCHC: 32.9 g/dL (ref 30.0–36.0)
MCV: 83.3 fL (ref 78.0–100.0)
MONO ABS: 0.7 10*3/uL (ref 0.1–1.0)
MONOS PCT: 7 %
NEUTROS PCT: 47 %
Neutro Abs: 4.9 10*3/uL (ref 1.7–7.7)
Platelets: 271 10*3/uL (ref 150–400)
RBC: 4.97 MIL/uL (ref 3.87–5.11)
RDW: 14.9 % (ref 11.5–15.5)
WBC: 10.2 10*3/uL (ref 4.0–10.5)

## 2015-02-19 LAB — COMPREHENSIVE METABOLIC PANEL
ALBUMIN: 4.5 g/dL (ref 3.5–5.0)
ALK PHOS: 78 U/L (ref 38–126)
ALT: 14 U/L (ref 14–54)
ANION GAP: 7 (ref 5–15)
AST: 18 U/L (ref 15–41)
BILIRUBIN TOTAL: 0.6 mg/dL (ref 0.3–1.2)
BUN: 15 mg/dL (ref 6–20)
CALCIUM: 9.4 mg/dL (ref 8.9–10.3)
CO2: 26 mmol/L (ref 22–32)
Chloride: 106 mmol/L (ref 101–111)
Creatinine, Ser: 0.94 mg/dL (ref 0.44–1.00)
GFR calc Af Amer: >60 mL/min (ref >60)
GLUCOSE: 101 mg/dL — AB (ref 65–99)
Potassium: 4.2 mmol/L (ref 3.5–5.1)
Sodium: 139 mmol/L (ref 135–145)
TOTAL PROTEIN: 7.9 g/dL (ref 6.5–8.1)

## 2015-02-19 LAB — SURGICAL PCR SCREEN
MRSA, PCR: NEGATIVE
Staphylococcus aureus: POSITIVE — AB

## 2015-02-19 LAB — LIPASE, BLOOD: LIPASE: 32 U/L (ref 22–51)

## 2015-02-19 SURGERY — LAPAROSCOPIC CHOLECYSTECTOMY WITH INTRAOPERATIVE CHOLANGIOGRAM
Anesthesia: General | Site: Abdomen

## 2015-02-19 MED ORDER — FENTANYL CITRATE (PF) 100 MCG/2ML IJ SOLN
100.0000 ug | Freq: Once | INTRAMUSCULAR | Status: AC
  Administered 2015-02-19: 100 ug via INTRAVENOUS
  Filled 2015-02-19: qty 2

## 2015-02-19 MED ORDER — ROCURONIUM BROMIDE 100 MG/10ML IV SOLN
INTRAVENOUS | Status: AC
  Filled 2015-02-19: qty 1

## 2015-02-19 MED ORDER — BACLOFEN 5 MG HALF TABLET
5.0000 mg | ORAL_TABLET | Freq: Two times a day (BID) | ORAL | Status: DC | PRN
  Filled 2015-02-19: qty 1

## 2015-02-19 MED ORDER — HYDROMORPHONE HCL 1 MG/ML IJ SOLN
INTRAMUSCULAR | Status: AC
  Filled 2015-02-19: qty 1

## 2015-02-19 MED ORDER — MIDAZOLAM HCL 5 MG/5ML IJ SOLN
INTRAMUSCULAR | Status: DC | PRN
  Administered 2015-02-19: 2 mg via INTRAVENOUS

## 2015-02-19 MED ORDER — VITAMIN D3 25 MCG (1000 UNIT) PO TABS
1000.0000 [IU] | ORAL_TABLET | Freq: Every day | ORAL | Status: DC
  Administered 2015-02-20: 1000 [IU] via ORAL
  Filled 2015-02-19: qty 1

## 2015-02-19 MED ORDER — OXYCODONE-ACETAMINOPHEN 5-325 MG PO TABS
ORAL_TABLET | ORAL | Status: AC
  Filled 2015-02-19: qty 2

## 2015-02-19 MED ORDER — FENTANYL CITRATE (PF) 250 MCG/5ML IJ SOLN
INTRAMUSCULAR | Status: AC
  Filled 2015-02-19: qty 25

## 2015-02-19 MED ORDER — 0.9 % SODIUM CHLORIDE (POUR BTL) OPTIME
TOPICAL | Status: DC | PRN
  Administered 2015-02-19: 1000 mL

## 2015-02-19 MED ORDER — BUPIVACAINE-EPINEPHRINE 0.5% -1:200000 IJ SOLN
INTRAMUSCULAR | Status: DC | PRN
  Administered 2015-02-19: 10 mL

## 2015-02-19 MED ORDER — PROMETHAZINE HCL 25 MG/ML IJ SOLN
6.2500 mg | INTRAMUSCULAR | Status: DC | PRN
  Administered 2015-02-19: 6.25 mg via INTRAVENOUS

## 2015-02-19 MED ORDER — LACTATED RINGERS IR SOLN
Status: DC | PRN
  Administered 2015-02-19: 1000 mL

## 2015-02-19 MED ORDER — LACTATED RINGERS IV SOLN
INTRAVENOUS | Status: DC
  Administered 2015-02-19: 16:00:00 via INTRAVENOUS
  Administered 2015-02-19: 1000 mL via INTRAVENOUS

## 2015-02-19 MED ORDER — PROMETHAZINE HCL 25 MG/ML IJ SOLN
INTRAMUSCULAR | Status: AC
  Filled 2015-02-19: qty 1

## 2015-02-19 MED ORDER — SUCCINYLCHOLINE CHLORIDE 20 MG/ML IJ SOLN
INTRAMUSCULAR | Status: DC | PRN
  Administered 2015-02-19: 100 mg via INTRAVENOUS

## 2015-02-19 MED ORDER — DEXAMETHASONE SODIUM PHOSPHATE 10 MG/ML IJ SOLN
INTRAMUSCULAR | Status: DC | PRN
  Administered 2015-02-19: 10 mg via INTRAVENOUS

## 2015-02-19 MED ORDER — MORPHINE SULFATE (PF) 2 MG/ML IV SOLN
2.0000 mg | INTRAVENOUS | Status: AC | PRN
  Administered 2015-02-19 – 2015-02-20 (×5): 2 mg via INTRAVENOUS
  Filled 2015-02-19 (×5): qty 1

## 2015-02-19 MED ORDER — KETOROLAC TROMETHAMINE 30 MG/ML IJ SOLN
30.0000 mg | Freq: Once | INTRAMUSCULAR | Status: AC
  Administered 2015-02-19: 30 mg via INTRAVENOUS

## 2015-02-19 MED ORDER — PROPOFOL 10 MG/ML IV BOLUS
INTRAVENOUS | Status: DC | PRN
  Administered 2015-02-19: 200 mg via INTRAVENOUS

## 2015-02-19 MED ORDER — LIDOCAINE HCL (CARDIAC) 20 MG/ML IV SOLN
INTRAVENOUS | Status: AC
  Filled 2015-02-19: qty 5

## 2015-02-19 MED ORDER — ONDANSETRON HCL 4 MG/2ML IJ SOLN
INTRAMUSCULAR | Status: DC | PRN
  Administered 2015-02-19: 4 mg via INTRAVENOUS

## 2015-02-19 MED ORDER — LIDOCAINE HCL (CARDIAC) 20 MG/ML IV SOLN
INTRAVENOUS | Status: DC | PRN
  Administered 2015-02-19: 100 mg via INTRAVENOUS

## 2015-02-19 MED ORDER — KCL IN DEXTROSE-NACL 20-5-0.45 MEQ/L-%-% IV SOLN
INTRAVENOUS | Status: DC
  Administered 2015-02-19 – 2015-02-20 (×2): via INTRAVENOUS
  Filled 2015-02-19 (×3): qty 1000

## 2015-02-19 MED ORDER — MUPIROCIN 2 % EX OINT
TOPICAL_OINTMENT | Freq: Two times a day (BID) | CUTANEOUS | Status: DC
  Administered 2015-02-19: 1 via NASAL
  Administered 2015-02-19 – 2015-02-20 (×2): via NASAL
  Filled 2015-02-19 (×2): qty 22

## 2015-02-19 MED ORDER — GLUCAGON HCL RDNA (DIAGNOSTIC) 1 MG IJ SOLR
INTRAMUSCULAR | Status: AC
  Filled 2015-02-19: qty 1

## 2015-02-19 MED ORDER — IOHEXOL 300 MG/ML  SOLN
INTRAMUSCULAR | Status: DC | PRN
  Administered 2015-02-19: 8 mL

## 2015-02-19 MED ORDER — DEXAMETHASONE SODIUM PHOSPHATE 10 MG/ML IJ SOLN
INTRAMUSCULAR | Status: AC
  Filled 2015-02-19: qty 1

## 2015-02-19 MED ORDER — DIPHENHYDRAMINE HCL 50 MG/ML IJ SOLN: 25.0000 mg | Freq: Four times a day (QID) | INTRAMUSCULAR | Status: DC | PRN

## 2015-02-19 MED ORDER — MIDAZOLAM HCL 2 MG/2ML IJ SOLN
INTRAMUSCULAR | Status: AC
  Filled 2015-02-19: qty 4

## 2015-02-19 MED ORDER — DIPHENHYDRAMINE HCL 25 MG PO CAPS: 25.0000 mg | ORAL_CAPSULE | Freq: Four times a day (QID) | ORAL | Status: DC | PRN

## 2015-02-19 MED ORDER — HYDROCODONE-ACETAMINOPHEN 5-325 MG PO TABS
1.0000 | ORAL_TABLET | ORAL | Status: DC | PRN
  Administered 2015-02-19 – 2015-02-20 (×3): 2 via ORAL

## 2015-02-19 MED ORDER — ENOXAPARIN SODIUM 40 MG/0.4ML ~~LOC~~ SOLN
40.0000 mg | SUBCUTANEOUS | Status: DC
  Filled 2015-02-19: qty 0.4

## 2015-02-19 MED ORDER — DIPHENHYDRAMINE HCL 25 MG PO CAPS
25.0000 mg | ORAL_CAPSULE | Freq: Four times a day (QID) | ORAL | Status: DC | PRN
  Administered 2015-02-19: 25 mg via ORAL
  Filled 2015-02-19: qty 1

## 2015-02-19 MED ORDER — BUPIVACAINE-EPINEPHRINE (PF) 0.5% -1:200000 IJ SOLN
INTRAMUSCULAR | Status: AC
  Filled 2015-02-19: qty 60

## 2015-02-19 MED ORDER — GLYCOPYRROLATE 0.2 MG/ML IJ SOLN
INTRAMUSCULAR | Status: AC
  Filled 2015-02-19: qty 3

## 2015-02-19 MED ORDER — DEXTROSE 5 % IV SOLN
2.0000 g | INTRAVENOUS | Status: DC
  Filled 2015-02-19: qty 2

## 2015-02-19 MED ORDER — ONDANSETRON HCL 4 MG/2ML IJ SOLN
4.0000 mg | Freq: Three times a day (TID) | INTRAMUSCULAR | Status: DC | PRN
  Administered 2015-02-19: 4 mg via INTRAVENOUS
  Filled 2015-02-19: qty 2

## 2015-02-19 MED ORDER — CEFTRIAXONE SODIUM 2 G IJ SOLR
2.0000 g | Freq: Once | INTRAMUSCULAR | Status: AC
  Administered 2015-02-19: 2 g via INTRAVENOUS

## 2015-02-19 MED ORDER — DIPHENHYDRAMINE HCL 50 MG/ML IJ SOLN
25.0000 mg | Freq: Once | INTRAMUSCULAR | Status: AC
  Administered 2015-02-19: 25 mg via INTRAVENOUS
  Filled 2015-02-19: qty 1

## 2015-02-19 MED ORDER — NEOSTIGMINE METHYLSULFATE 10 MG/10ML IV SOLN
INTRAVENOUS | Status: AC
  Filled 2015-02-19: qty 1

## 2015-02-19 MED ORDER — SODIUM CHLORIDE 0.9 % IV SOLN
INTRAVENOUS | Status: DC
  Administered 2015-02-19: 02:00:00 via INTRAVENOUS

## 2015-02-19 MED ORDER — CEFTRIAXONE SODIUM 2 G IJ SOLR
INTRAMUSCULAR | Status: AC
  Filled 2015-02-19: qty 2

## 2015-02-19 MED ORDER — DOCUSATE SODIUM 100 MG PO CAPS
100.0000 mg | ORAL_CAPSULE | Freq: Two times a day (BID) | ORAL | Status: DC
  Administered 2015-02-19 – 2015-02-20 (×2): 100 mg via ORAL

## 2015-02-19 MED ORDER — ONDANSETRON HCL 4 MG/2ML IJ SOLN
INTRAMUSCULAR | Status: AC
  Filled 2015-02-19: qty 2

## 2015-02-19 MED ORDER — KETOROLAC TROMETHAMINE 30 MG/ML IJ SOLN
INTRAMUSCULAR | Status: AC
  Filled 2015-02-19: qty 1

## 2015-02-19 MED ORDER — FENTANYL CITRATE (PF) 100 MCG/2ML IJ SOLN
INTRAMUSCULAR | Status: DC | PRN
  Administered 2015-02-19 (×5): 50 ug via INTRAVENOUS

## 2015-02-19 MED ORDER — BUSPIRONE HCL 15 MG PO TABS
7.5000 mg | ORAL_TABLET | Freq: Two times a day (BID) | ORAL | Status: DC
  Administered 2015-02-20: 7.5 mg via ORAL
  Filled 2015-02-19 (×4): qty 1

## 2015-02-19 MED ORDER — HYDROMORPHONE HCL 1 MG/ML IJ SOLN
0.2500 mg | INTRAMUSCULAR | Status: DC | PRN
  Administered 2015-02-19 (×4): 0.5 mg via INTRAVENOUS

## 2015-02-19 MED ORDER — HYDROCODONE-ACETAMINOPHEN 5-325 MG PO TABS
1.0000 | ORAL_TABLET | ORAL | Status: DC | PRN
  Filled 2015-02-19 (×3): qty 2

## 2015-02-19 MED ORDER — OXYCODONE-ACETAMINOPHEN 5-325 MG PO TABS
2.0000 | ORAL_TABLET | Freq: Once | ORAL | Status: AC
  Administered 2015-02-19: 2 via ORAL

## 2015-02-19 MED ORDER — ROCURONIUM BROMIDE 100 MG/10ML IV SOLN
INTRAVENOUS | Status: DC | PRN
  Administered 2015-02-19: 40 mg via INTRAVENOUS

## 2015-02-19 MED ORDER — GLYCOPYRROLATE 0.2 MG/ML IJ SOLN
INTRAMUSCULAR | Status: DC | PRN
  Administered 2015-02-19: 0.6 mg via INTRAVENOUS

## 2015-02-19 MED ORDER — PROPOFOL 10 MG/ML IV BOLUS
INTRAVENOUS | Status: AC
  Filled 2015-02-19: qty 20

## 2015-02-19 MED ORDER — NEOSTIGMINE METHYLSULFATE 10 MG/10ML IV SOLN
INTRAVENOUS | Status: DC | PRN
Start: 2015-02-19 — End: 2015-02-19
  Administered 2015-02-19: 5 mg via INTRAVENOUS

## 2015-02-19 SURGICAL SUPPLY — 35 items
APPLIER CLIP ROT 10 11.4 M/L (STAPLE) ×2
BENZOIN TINCTURE PRP APPL 2/3 (GAUZE/BANDAGES/DRESSINGS) ×2 IMPLANT
CABLE HIGH FREQUENCY MONO STRZ (ELECTRODE) ×2 IMPLANT
CHLORAPREP W/TINT 26ML (MISCELLANEOUS) ×2 IMPLANT
CLIP APPLIE ROT 10 11.4 M/L (STAPLE) ×1 IMPLANT
COVER MAYO STAND STRL (DRAPES) ×2 IMPLANT
COVER SURGICAL LIGHT HANDLE (MISCELLANEOUS) ×2 IMPLANT
DECANTER SPIKE VIAL GLASS SM (MISCELLANEOUS) IMPLANT
DRAPE C-ARM 42X120 X-RAY (DRAPES) ×2 IMPLANT
DRAPE LAPAROSCOPIC ABDOMINAL (DRAPES) ×2 IMPLANT
ELECT REM PT RETURN 9FT ADLT (ELECTROSURGICAL) ×2
ELECTRODE REM PT RTRN 9FT ADLT (ELECTROSURGICAL) ×1 IMPLANT
GAUZE SPONGE 2X2 8PLY STRL LF (GAUZE/BANDAGES/DRESSINGS) ×1 IMPLANT
GLOVE BIOGEL PI IND STRL 7.0 (GLOVE) ×2 IMPLANT
GLOVE BIOGEL PI IND STRL 7.5 (GLOVE) ×2 IMPLANT
GLOVE BIOGEL PI INDICATOR 7.0 (GLOVE) ×2
GLOVE BIOGEL PI INDICATOR 7.5 (GLOVE) ×2
GLOVE SURG ORTHO 8.0 STRL STRW (GLOVE) ×2 IMPLANT
GOWN STRL REUS W/TWL XL LVL3 (GOWN DISPOSABLE) ×6 IMPLANT
HEMOSTAT SURGICEL 4X8 (HEMOSTASIS) IMPLANT
KIT BASIN OR (CUSTOM PROCEDURE TRAY) ×2 IMPLANT
POUCH SPECIMEN RETRIEVAL 10MM (ENDOMECHANICALS) ×2 IMPLANT
SCISSORS LAP 5X35 DISP (ENDOMECHANICALS) ×2 IMPLANT
SET CHOLANGIOGRAPH MIX (MISCELLANEOUS) ×2 IMPLANT
SET IRRIG TUBING LAPAROSCOPIC (IRRIGATION / IRRIGATOR) ×2 IMPLANT
SLEEVE XCEL OPT CAN 5 100 (ENDOMECHANICALS) ×2 IMPLANT
SPONGE GAUZE 2X2 STER 10/PKG (GAUZE/BANDAGES/DRESSINGS) ×1
STRIP CLOSURE SKIN 1/2X4 (GAUZE/BANDAGES/DRESSINGS) ×2 IMPLANT
SUT MNCRL AB 4-0 PS2 18 (SUTURE) ×2 IMPLANT
TAPE CLOTH SURG 4X10 WHT LF (GAUZE/BANDAGES/DRESSINGS) ×2 IMPLANT
TOWEL OR 17X26 10 PK STRL BLUE (TOWEL DISPOSABLE) ×2 IMPLANT
TRAY LAPAROSCOPIC (CUSTOM PROCEDURE TRAY) ×2 IMPLANT
TROCAR BLADELESS OPT 5 100 (ENDOMECHANICALS) ×2 IMPLANT
TROCAR XCEL BLUNT TIP 100MML (ENDOMECHANICALS) ×2 IMPLANT
TROCAR XCEL NON-BLD 11X100MML (ENDOMECHANICALS) ×2 IMPLANT

## 2015-02-19 NOTE — ED Notes (Signed)
MD at bedside discussing plan of care for admission.

## 2015-02-19 NOTE — ED Notes (Signed)
Patient transported to Ultrasound 

## 2015-02-19 NOTE — Interval H&P Note (Signed)
History and Physical Interval Note:  02/19/2015 3:05 PM  Faith Beck  has presented today for surgery, with the diagnosis of ACUTE CHOLECYSTITIS.  The various methods of treatment have been discussed with the patient and family. After consideration of risks, benefits and other options for treatment, the patient has consented to    Procedure(s): LAPAROSCOPIC CHOLECYSTECTOMY WITH INTRAOPERATIVE CHOLANGIOGRAM (N/A) as a surgical intervention .    The patient's history has been reviewed, patient examined, no change in status, stable for surgery.  I have reviewed the patient's chart and labs.  Questions were answered to the patient's satisfaction.    Velora Hecklerodd M. Laydon Martis, MD, [facility] Office: [phone]    Luken Shadowens MJudie Petit

## 2015-02-19 NOTE — Progress Notes (Signed)
Subjective: Still having pain, no nausea or vomiting.   Objective: Vital signs in last 24 hours: Temp:  [98.4 F (36.9 C)] 98.4 F (36.9 C) (10/13 0327) Pulse Rate:  [75-98] 79 (10/13 0327) Resp:  [16-18] 16 (10/13 0327) BP: (103-110)/(61-76) 103/61 mmHg (10/13 0327) SpO2:  [96 %-100 %] 100 % (10/13 0327) Weight:  [104 kg (229 lb 4.5 oz)-104.327 kg (230 lb)] 104 kg (229 lb 4.5 oz) (10/13 0529) Last BM Date: 02/18/15 NPO Afebrile, VSS Labs OK this AM  Intake/Output from previous day:   Intake/Output this shift:    General appearance: alert, cooperative and no distress GI: pain RUQ, soft, +BS.  Lab Results:   Recent Labs  02/19/15 0115  WBC 10.2  HGB 13.6  HCT 41.4  PLT 271    BMET  Recent Labs  02/19/15 0115  NA 139  K 4.2  CL 106  CO2 26  GLUCOSE 101*  BUN 15  CREATININE 0.94  CALCIUM 9.4   PT/INR No results for input(s): LABPROT, INR in the last 72 hours.   Recent Labs Lab 02/13/15 2232 02/15/15 0030 02/19/15 0115  AST ALT 14 12* 14  ALKPHOS 78 73 78  BILITOT 0.5 0.3 0.6  PROT 7.4 7.1 7.9  ALBUMIN 4.4 3.9 4.5     Lipase     Component Value Date/Time   LIPASE 32 02/19/2015 0115     Studies/Results: US Abdomen Complete  02/19/2015  CLINICAL DATA:  24 year old female with gallstones. 5 hr of acute right upper quadrant abdominal pain. Low-grade fever. Initial encounter. EXAM: ULTRASOUND ABDOMEN COMPLETE COMPARISON:  02/13/2015. FINDINGS: Gallbladder: Cholelithiasis re- demonstrated, individual stones up to 7 mm diameter. New gallbladder wall thickening and positive sonographic Murphy sign. Common bile duct: Diameter: 2 mm, normal Liver: No focal lesion identified. Within normal limits in parenchymal echogenicity. No intrahepatic biliary ductal dilatation. IVC: No abnormality visualized. Pancreas: Within normal limits. Spleen: Size and appearance within normal limits. Right Kidney: Length: 11.5 cm . Echogenicity within normal  limits. No mass or hydronephrosis visualized. Left Kidney: Length: 11.2 cm. Echogenicity within normal limits. No mass or hydronephrosis visualized. Abdominal aorta: No aneurysm visualized. Other findings: None. IMPRESSION: 1. Acute Cholecystitis.  Individual gallstones up to 7 mm. 2. Otherwise negative abdomen ultrasound. Electronically Signed   By: Odessa Fleming M.D.   On: 02/19/2015 00:45    Medications: . cefTRIAXone (ROCEPHIN)  IV  2 g Intravenous Q24H  . docusate sodium  100 mg Oral BID  . [START ON 02/20/2015] enoxaparin (LOVENOX) injection  40 mg Subcutaneous Q24H   . dextrose 5 % and 0.45 % NaCl with KCl 20 mEq/L     Prior to Admission medications   Medication Sig Start Date End Date Taking? Authorizing Provider  baclofen (LIORESAL) 5 mg TABS tablet Take 5 mg by mouth 2 (two) times daily as needed for muscle spasms.   Yes Historical Provider, MD  busPIRone (BUSPAR) 7.5 MG tablet Take 7.5 mg by mouth 2 (two) times daily.   Yes Historical Provider, MD  cholecalciferol (VITAMIN D) 1000 UNITS tablet Take 1,000 Units by mouth daily.   Yes Historical Provider, MD  HYDROcodone-acetaminophen (NORCO) 5-325 MG tablet Take 1-2 tablets by mouth every 6 (six) hours as needed. 02/15/15  Yes Geoffery Lyons, MD  ibuprofen (ADVIL,MOTRIN) 800 MG tablet Take 800 mg by mouth every 8 (eight) hours as needed. pain    Yes Historical Provider, MD  ibuprofen (ADVIL,MOTRIN) 800 MG tablet Take 800 mg by  mouth every 8 (eight) hours as needed for moderate pain.   Yes Historical Provider, MD  levothyroxine (SYNTHROID, LEVOTHROID) 25 MCG tablet Take 25 mcg by mouth daily before breakfast.   Yes Historical Provider, MD  omeprazole (PRILOSEC) 20 MG capsule Take 20 mg by mouth daily.   Yes Historical Provider, MD  ondansetron (ZOFRAN ODT) 4 MG disintegrating tablet Take 1 tablet (4 mg total) by mouth every 8 (eight) hours as needed for nausea or vomiting. 02/14/15  Yes Alvira MondayErin Schlossman, MD  saccharomyces boulardii (FLORASTOR) 250  MG capsule Take 250 mg by mouth daily.   Yes Historical Provider, MD  azithromycin (ZITHROMAX Z-PAK) 250 MG tablet 2 po day one, then 1 daily x 4 days Patient not taking: Reported on 02/13/2015 01/11/13   April Palumbo, MD  benzonatate (TESSALON) 100 MG capsule Take 1 capsule (100 mg total) by mouth every 8 (eight) hours. Patient not taking: Reported on 02/13/2015 02/03/12   Susy Frizzleharles Sheldon, MD  etonogestrel (NEXPLANON) 68 MG IMPL implant 1 each by Subdermal route continuous.     Historical Provider, MD  fluticasone (FLONASE) 50 MCG/ACT nasal spray Place 2 sprays into the nose daily. Patient not taking: Reported on 02/13/2015 01/11/13   April Palumbo, MD  penicillin v potassium (VEETID) 500 MG tablet Take 1 tablet (500 mg total) by mouth 3 (three) times daily. Patient not taking: Reported on 02/13/2015 10/10/14   Geoffery Lyonsouglas Delo, MD     Assessment/Plan Acute cholecystitis/cholelithiasis Hx of prior C Sections Hypothyroid Hx of depression/anxiety Hx of bronchitis Hx of nephrolithiasis    Plan:  We will try to get her into the schedule for surgery later today.  NPO, risk and benefits discussed and pt is agreeable to surgery.      Jaison Petraglia 02/19/2015

## 2015-02-19 NOTE — Anesthesia Postprocedure Evaluation (Signed)
  Anesthesia Post-op Note  Patient: Faith Beck  Procedure(s) Performed: Procedure(s) (LRB): LAPAROSCOPIC CHOLECYSTECTOMY WITH INTRAOPERATIVE CHOLANGIOGRAM (N/A)  Patient Location: PACU  Anesthesia Type: General  Level of Consciousness: awake and alert   Airway and Oxygen Therapy: Patient Spontanous Breathing  Post-op Pain: mild  Post-op Assessment: Post-op Vital signs reviewed, Patient's Cardiovascular Status Stable, Respiratory Function Stable, Patent Airway and No signs of Nausea or vomiting  Last Vitals:  Filed Vitals:   02/19/15 1715  BP: 106/66  Pulse: 69  Temp:   Resp: 15    Post-op Vital Signs: stable   Complications: No apparent anesthesia complications

## 2015-02-19 NOTE — Transfer of Care (Signed)
Immediate Anesthesia Transfer of Care Note  Patient: Faith Beck  Procedure(s) Performed: Procedure(s): LAPAROSCOPIC CHOLECYSTECTOMY WITH INTRAOPERATIVE CHOLANGIOGRAM (N/A)  Patient Location: PACU  Anesthesia Type:General  Level of Consciousness: sedated  Airway & Oxygen Therapy: Patient Spontanous Breathing and Patient connected to face mask oxygen  Post-op Assessment: Report given to RN and Post -op Vital signs reviewed and stable  Post vital signs: Reviewed and stable  Last Vitals:  Filed Vitals:   02/19/15 1359  BP: 114/67  Pulse: 69  Temp: 37.1 C  Resp: 16    Complications: No apparent anesthesia complications

## 2015-02-19 NOTE — Anesthesia Preprocedure Evaluation (Signed)
Anesthesia Evaluation  Patient identified by MRN, date of birth, ID band Patient awake    Reviewed: Allergy & Precautions, NPO status , Patient's Chart, lab work & pertinent test results  Airway Mallampati: II  TM Distance: <3 FB Neck ROM: Full    Dental no notable dental hx.    Pulmonary neg pulmonary ROS,    Pulmonary exam normal breath sounds clear to auscultation       Cardiovascular negative cardio ROS Normal cardiovascular exam Rhythm:Regular Rate:Normal     Neuro/Psych Depression negative neurological ROS     GI/Hepatic negative GI ROS, Neg liver ROS,   Endo/Other  Morbid obesity  Renal/GU negative Renal ROS  negative genitourinary   Musculoskeletal negative musculoskeletal ROS (+)   Abdominal (+) + obese,   Peds negative pediatric ROS (+)  Hematology negative hematology ROS (+)   Anesthesia Other Findings   Reproductive/Obstetrics negative OB ROS                             Anesthesia Physical Anesthesia Plan  ASA: II  Anesthesia Plan: General   Post-op Pain Management:    Induction: Intravenous  Airway Management Planned: Oral ETT  Additional Equipment:   Intra-op Plan:   Post-operative Plan: Extubation in OR  Informed Consent: I have reviewed the patients History and Physical, chart, labs and discussed the procedure including the risks, benefits and alternatives for the proposed anesthesia with the patient or authorized representative who has indicated his/her understanding and acceptance.   Dental advisory given  Plan Discussed with: CRNA and Surgeon  Anesthesia Plan Comments:         Anesthesia Quick Evaluation

## 2015-02-19 NOTE — Op Note (Signed)
Procedure Note  Pre-operative Diagnosis:  Acute cholecystitis, cholelithiasis    Post-operative Diagnosis:  same  Surgeon:  Faith Hecklerodd M. Theresa Wedel, MD, FACS  Assistant:  none   Procedure:  Laparoscopic cholecystectomy with intra-operative cholangiography  Anesthesia:  General  Estimated Blood Loss:  minimal  Drains: none         Specimen: Gallbladder to pathology  Indications:  This is a 24 year old female who is here for days ago for abdominal pain and was diagnosed with biliary colic. She was discharged home on Zofran and Vicodin. She has had persistent pain since that visit which acutely worsened yesterday evening about 8 PM. The pain is in the right upper quadrant and radiates to the right shoulder. It is worse with movement or palpation. There is been associated multiple episodes of emesis.   Procedure Details:  The patient was seen in the pre-op holding area. The risks, benefits, complications, treatment options, and expected outcomes were previously discussed with the patient. The patient agreed with the proposed plan and has signed the informed consent form.  The patient was brought to the Operating Room, identified as Faith Beck and the procedure verified as laparoscopic cholecystectomy with intraoperative cholangiography. A "time out" was completed and the above information confirmed.  The patient was placed in the supine position. Following induction of general anesthesia, the abdomen was prepped and draped in the usual aseptic fashion.  An incision was made in the skin near the umbilicus. The midline fascia was incised and the peritoneal cavity was entered and a Hasson canula was introduced under direct vision.  The Hasson canula was secured with a 0-Vicryl pursestring suture. Pneumoperitoneum was established with carbon dioxide. Additional trocars were introduced under direct vision along the right costal margin in the midline, mid-clavicular line, and anterior axillary  line.   The gallbladder was identified and the fundus grasped and retracted cephalad. Adhesions were taken down bluntly and the electrocautery was utilized as needed, taking care not to injure any adjacent structures. The infundibulum was grasped and retracted laterally, exposing the peritoneum overlying the triangle of Calot. The peritoneum was incised and structures exposed with blunt dissection. The cystic duct was clearly identified, bluntly dissected circumferentially, and clipped at the neck of the gallbladder.  An incision was made in the cystic duct and the cholangiogram catheter introduced. The catheter was secured using an ligaclip.  Real-time cholangiography was performed using C-arm fluoroscopy.  There was rapid filling of a normal caliber common bile duct.  There was reflux of contrast into the left and right hepatic ductal systems.  There was free flow distally into the duodenum without filling defect or obstruction.  The catheter was removed from the peritoneal cavity.  The cystic duct was then ligated with surgical clips and divided. The cystic artery was identified, dissected circumferentially, ligated with ligaclips, and divided.  The gallbladder was dissected away from the liver bed using the electrocautery for hemostasis. The gallbladder was completely removed from the liver and placed into an endocatch bag. The gallbladder was removed in the endocatch bag through the umbilical port site and submitted to pathology for review.  The right upper quadrant was irrigated and the gallbladder bed was inspected. Hemostasis was achieved with the electrocautery.  Pneumoperitoneum was released after viewing removal of the trocars with good hemostasis noted. The umbilical wound was irrigated and the fascia was then closed with the pursestring suture.  Local anesthetic was infiltrated at all port sites. The skin incisions were closed with 4-0 Monocril  subcuticular sutures and steri-strips and  dressings were applied.  Instrument, sponge, and needle counts were correct at the conclusion of the case.  The patient was awakened from anesthesia and brought to the recovery room in stable condition.  The patient tolerated the procedure well.   Faith Heckler, MD, [facility] Office: [phone]

## 2015-02-19 NOTE — H&P (Signed)
Faith Beck is an 24 y.o. female.   Chief Complaint: RUQ pain and R shoulder pain  HPI: This is a 24-year-old female who is here for days ago for abdominal pain and was diagnosed with biliary colic. She was discharged home on Zofran and Vicodin. She has had persistent pain since that visit which acutely worsened yesterday evening about 8 PM. The pain is in the right upper quadrant and radiates to the right shoulder. It is worse with movement or palpation. There is been associated multiple episodes of emesis. She is not aware of having a fever.   Past Medical History  Diagnosis Date  . Depression   . Asthma   . Bronchitis   . Depression   . Kidney stones     Past Surgical History  Procedure Laterality Date  . Cesarean section    . Dilation and curettage of uterus      History reviewed. No pertinent family history. Social History:  reports that she has never smoked. She does not have any smokeless tobacco history on file. She reports that she does not drink alcohol or use illicit drugs.  Allergies: No Known Allergies  Medications Prior to Admission  Medication Sig Dispense Refill  . baclofen (LIORESAL) 5 mg TABS tablet Take 5 mg by mouth 2 (two) times daily as needed for muscle spasms.    . busPIRone (BUSPAR) 7.5 MG tablet Take 7.5 mg by mouth 2 (two) times daily.    . cholecalciferol (VITAMIN D) 1000 UNITS tablet Take 1,000 Units by mouth daily.    . HYDROcodone-acetaminophen (NORCO) 5-325 MG tablet Take 1-2 tablets by mouth every 6 (six) hours as needed. 15 tablet 0  . ibuprofen (ADVIL,MOTRIN) 800 MG tablet Take 800 mg by mouth every 8 (eight) hours as needed. pain     . ibuprofen (ADVIL,MOTRIN) 800 MG tablet Take 800 mg by mouth every 8 (eight) hours as needed for moderate pain.    . levothyroxine (SYNTHROID, LEVOTHROID) 25 MCG tablet Take 25 mcg by mouth daily before breakfast.    . omeprazole (PRILOSEC) 20 MG capsule Take 20 mg by mouth daily.    . ondansetron (ZOFRAN  ODT) 4 MG disintegrating tablet Take 1 tablet (4 mg total) by mouth every 8 (eight) hours as needed for nausea or vomiting. 12 tablet 0  . saccharomyces boulardii (FLORASTOR) 250 MG capsule Take 250 mg by mouth daily.    . azithromycin (ZITHROMAX Z-PAK) 250 MG tablet 2 po day one, then 1 daily x 4 days (Patient not taking: Reported on 02/13/2015) 5 tablet 0  . benzonatate (TESSALON) 100 MG capsule Take 1 capsule (100 mg total) by mouth every 8 (eight) hours. (Patient not taking: Reported on 02/13/2015) 21 capsule 0  . etonogestrel (NEXPLANON) 68 MG IMPL implant 1 each by Subdermal route continuous.     . fluticasone (FLONASE) 50 MCG/ACT nasal spray Place 2 sprays into the nose daily. (Patient not taking: Reported on 02/13/2015) 16 g 0  . penicillin v potassium (VEETID) 500 MG tablet Take 1 tablet (500 mg total) by mouth 3 (three) times daily. (Patient not taking: Reported on 02/13/2015) 30 tablet 0    Results for orders placed or performed during the hospital encounter of 02/18/15 (from the past 48 hour(s))  Comprehensive metabolic panel     Status: Abnormal   Collection Time: 02/19/15  1:15 AM  Result Value Ref Range   Sodium 139 135 - 145 mmol/L   Potassium 4.2 3.5 - 5.1 mmol/L     Chloride 106 101 - 111 mmol/L   CO2 26 22 - 32 mmol/L   Glucose, Bld 101 (H) 65 - 99 mg/dL   BUN 15 6 - 20 mg/dL   Creatinine, Ser 0.94 0.44 - 1.00 mg/dL   Calcium 9.4 8.9 - 10.3 mg/dL   Total Protein 7.9 6.5 - 8.1 g/dL   Albumin 4.5 3.5 - 5.0 g/dL   AST 18 15 - 41 U/L   ALT 14 14 - 54 U/L   Alkaline Phosphatase 78 38 - 126 U/L   Total Bilirubin 0.6 0.3 - 1.2 mg/dL   GFR calc non Af Amer >60 >60 mL/min   GFR calc Af Amer >60 >60 mL/min    Comment: (NOTE) The eGFR has been calculated using the CKD EPI equation. This calculation has not been validated in all clinical situations. eGFR's persistently <60 mL/min signify possible Chronic Kidney Disease.    Anion gap 7 5 - 15  Lipase, blood     Status: None    Collection Time: 02/19/15  1:15 AM  Result Value Ref Range   Lipase 32 22 - 51 U/L  CBC with Differential/Platelet     Status: Abnormal   Collection Time: 02/19/15  1:15 AM  Result Value Ref Range   WBC 10.2 4.0 - 10.5 K/uL   RBC 4.97 3.87 - 5.11 MIL/uL   Hemoglobin 13.6 12.0 - 15.0 g/dL   HCT 41.4 36.0 - 46.0 %   MCV 83.3 78.0 - 100.0 fL   MCH 27.4 26.0 - 34.0 pg   MCHC 32.9 30.0 - 36.0 g/dL   RDW 14.9 11.5 - 15.5 %   Platelets 271 150 - 400 K/uL   Neutrophils Relative % 47 %   Neutro Abs 4.9 1.7 - 7.7 K/uL   Lymphocytes Relative 34 %   Lymphs Abs 3.5 0.7 - 4.0 K/uL   Monocytes Relative 7 %   Monocytes Absolute 0.7 0.1 - 1.0 K/uL   Eosinophils Relative 11 %   Eosinophils Absolute 1.1 (H) 0.0 - 0.7 K/uL   Basophils Relative 1 %   Basophils Absolute 0.1 0.0 - 0.1 K/uL   Us Abdomen Complete  02/19/2015  CLINICAL DATA:  24-year-old female with gallstones. 5 hr of acute right upper quadrant abdominal pain. Low-grade fever. Initial encounter. EXAM: ULTRASOUND ABDOMEN COMPLETE COMPARISON:  02/13/2015. FINDINGS: Gallbladder: Cholelithiasis re- demonstrated, individual stones up to 7 mm diameter. New gallbladder wall thickening and positive sonographic Murphy sign. Common bile duct: Diameter: 2 mm, normal Liver: No focal lesion identified. Within normal limits in parenchymal echogenicity. No intrahepatic biliary ductal dilatation. IVC: No abnormality visualized. Pancreas: Within normal limits. Spleen: Size and appearance within normal limits. Right Kidney: Length: 11.5 cm . Echogenicity within normal limits. No mass or hydronephrosis visualized. Left Kidney: Length: 11.2 cm. Echogenicity within normal limits. No mass or hydronephrosis visualized. Abdominal aorta: No aneurysm visualized. Other findings: None. IMPRESSION: 1. Acute Cholecystitis.  Individual gallstones up to 7 mm. 2. Otherwise negative abdomen ultrasound. Electronically Signed   By: H  Hall M.D.   On: 02/19/2015 00:45    Review  of Systems  Constitutional: Negative for fever and chills.    Blood pressure 103/61, pulse 79, temperature 98.4 F (36.9 C), temperature source Oral, resp. rate 16, height 5' 3" (1.6 m), weight 104 kg (229 lb 4.5 oz), SpO2 100 %. Physical Exam  Constitutional: She is oriented to person, place, and time. She appears well-developed and well-nourished.  HENT:  Head: Normocephalic and atraumatic.  Eyes:   Conjunctivae are normal. Pupils are equal, round, and reactive to light.  Neck: Normal range of motion. No tracheal deviation present. No thyromegaly present.  Cardiovascular: Normal rate and regular rhythm.   Respiratory: Effort normal and breath sounds normal. No respiratory distress.  GI: Soft. She exhibits no distension.  Musculoskeletal: Normal range of motion.  Neurological: She is alert and oriented to person, place, and time.  Skin: Skin is warm and dry.     Assessment/Plan 25 y.o. F with signs and symptoms of acute cholecystitis.  US shows gallstones and wall thickening.  LFT's are normal.  Patient will be admitted and placed on antibiotics.  Plan for surgery later today if possible.    Verdie Wilms C. 13/12/6576, 7:13 AM

## 2015-02-19 NOTE — Anesthesia Procedure Notes (Signed)
Procedure Name: Intubation Date/Time: 02/19/2015 3:27 PM Performed by: Doran ClayALDAY, Kanna Dafoe R Pre-anesthesia Checklist: Patient identified, Emergency Drugs available, Suction available, Patient being monitored and Timeout performed Patient Re-evaluated:Patient Re-evaluated prior to inductionOxygen Delivery Method: Circle system utilized Preoxygenation: Pre-oxygenation with 100% oxygen Intubation Type: IV induction Laryngoscope Size: Mac and 3 Grade View: Grade I Tube type: Oral Tube size: 7.0 mm Number of attempts: 1 Airway Equipment and Method: Stylet Placement Confirmation: ETT inserted through vocal cords under direct vision,  positive ETCO2 and breath sounds checked- equal and bilateral Secured at: 22 cm Tube secured with: Tape Dental Injury: Teeth and Oropharynx as per pre-operative assessment

## 2015-02-19 NOTE — ED Notes (Signed)
MD at bedside. 

## 2015-02-20 ENCOUNTER — Encounter: Payer: Self-pay | Admitting: General Surgery

## 2015-02-20 MED ORDER — HYDROCODONE-ACETAMINOPHEN 5-325 MG PO TABS: 1.0000 | ORAL_TABLET | Freq: Four times a day (QID) | ORAL | Status: DC | PRN

## 2015-02-20 MED ORDER — IBUPROFEN 200 MG PO TABS
ORAL_TABLET | ORAL | Status: DC
Start: 2015-02-20 — End: 2015-11-08

## 2015-02-20 NOTE — Discharge Instructions (Signed)
Laparoscopic Cholecystectomy, Care After °Refer to this sheet in the next few weeks. These instructions provide you with information about caring for yourself after your procedure. Your health care provider may also give you more specific instructions. Your treatment has been planned according to current medical practices, but problems sometimes occur. Call your health care provider if you have any problems or questions after your procedure. °WHAT TO EXPECT AFTER THE PROCEDURE °After your procedure, it is common to have: °· Pain at your incision sites. You will be given pain medicines to control your pain. °· Mild nausea or vomiting. This should improve after the first 24 hours. °· Bloating and possible shoulder pain from the gas that was used during the procedure. This will improve after the first 24 hours. °HOME CARE INSTRUCTIONS °Incision Care °· Follow instructions from your health care provider about how to take care of your incisions. Make sure you: °¨ Wash your hands with soap and water before you change your bandage (dressing). If soap and water are not available, use hand sanitizer. °¨ Change your dressing as told by your health care provider. °¨ Leave stitches (sutures), skin glue, or adhesive strips in place. These skin closures may need to be in place for 2 weeks or longer. If adhesive strip edges start to loosen and curl up, you may trim the loose edges. Do not remove adhesive strips completely unless your health care provider tells you to do that. °· Do not take baths, swim, or use a hot tub until your health care provider approves. Ask your health care provider if you can take showers. You may only be allowed to take sponge baths for bathing. °General Instructions °· Take over-the-counter and prescription medicines only as told by your health care provider. °· Do not drive or operate heavy machinery while taking prescription pain medicine. °· Return to your normal diet as told by your health care  provider. °· Do not lift anything that is heavier than 10 lb (4.5 kg). °· Do not play contact sports for one week or until your health care provider approves. °SEEK MEDICAL CARE IF:  °· You have redness, swelling, or pain at the site of your incision. °· You have fluid, blood, or pus coming from your incision. °· You notice a bad smell coming from your incision area. °· Your surgical incisions break open. °· You have a fever. °SEEK IMMEDIATE MEDICAL CARE IF: °· You develop a rash. °· You have difficulty breathing. °· You have chest pain. °· You have increasing pain in your shoulders (shoulder strap areas). °· You faint or have dizzy episodes while you are standing. °· You have severe pain in your abdomen. °· You have nausea or vomiting that lasts for more than one day. °  °This information is not intended to replace advice given to you by your health care provider. Make sure you discuss any questions you have with your health care provider. °  °Document Released: 04/25/2005 Document Revised: 01/14/2015 Document Reviewed: 12/05/2012 °Elsevier Interactive Patient Education ©2016 [employer] °CCS ______[facility] °LAPAROSCOPIC SURGERY: POST OP INSTRUCTIONS °Always review your discharge instruction sheet given to you by the facility where your surgery was performed. °IF YOU HAVE DISABILITY OR FAMILY LEAVE FORMS, YOU MUST BRING THEM TO THE OFFICE FOR PROCESSING.   °DO NOT GIVE THEM TO YOUR DOCTOR. ° °1. A prescription for pain medication may be given to you upon discharge.  Take your pain medication as prescribed, if needed.  If narcotic   pain medicine is not needed, then you may take acetaminophen (Tylenol) or ibuprofen (Advil) as needed. °2. Take your usually prescribed medications unless otherwise directed. °3. If you need a refill on your pain medication, please contact your pharmacy.  They will contact our office to request authorization. Prescriptions will not be filled after 5pm or on  week-ends. °4. You should follow a light diet the first few days after arrival home, such as soup and crackers, etc.  Be sure to include lots of fluids daily. °5. Most patients will experience some swelling and bruising in the area of the incisions.  Ice packs will help.  Swelling and bruising can take several days to resolve.  °6. It is common to experience some constipation if taking pain medication after surgery.  Increasing fluid intake and taking a stool softener (such as Colace) will usually help or prevent this problem from occurring.  A mild laxative (Milk of Magnesia or Miralax) should be taken according to package instructions if there are no bowel movements after 48 hours. °7. Unless discharge instructions indicate otherwise, you may remove your bandages 24-48 hours after surgery, and you may shower at that time.  You may have steri-strips (small skin tapes) in place directly over the incision.  These strips should be left on the skin for 7-10 days.  If your surgeon used skin glue on the incision, you may shower in 24 hours.  The glue will flake off over the next 2-3 weeks.  Any sutures or staples will be removed at the office during your follow-up visit. °8. ACTIVITIES:  You may resume regular (light) daily activities beginning the next day--such as daily self-care, walking, climbing stairs--gradually increasing activities as tolerated.  You may have sexual intercourse when it is comfortable.  Refrain from any heavy lifting or straining until approved by your doctor. °a. You may drive when you are no longer taking prescription pain medication, you can comfortably wear a seatbelt, and you can safely maneuver your car and apply brakes. °b. RETURN TO WORK:  __________________________________________________________ °9. You should see your doctor in the office for a follow-up appointment approximately 2-3 weeks after your surgery.  Make sure that you call for this appointment within a day or two after you  arrive home to insure a convenient appointment time. °10. OTHER INSTRUCTIONS: __________________________________________________________________________________________________________________________ __________________________________________________________________________________________________________________________ °WHEN TO CALL YOUR DOCTOR: °1. Fever over 101.0 °2. Inability to urinate °3. Continued bleeding from incision. °4. Increased pain, redness, or drainage from the incision. °5. Increasing abdominal pain ° °The clinic staff is available to answer your questions during regular business hours.  Please don’t hesitate to call and ask to speak to one of the nurses for clinical concerns.  If you have a medical emergency, go to the nearest emergency room or call 911.  A surgeon from [facility] is always on call at the hospital. °[address], [address], [place], [place]  [address] ? [address] [address], [place], [place]   [address] °[phone] ? [phone] ? FAX [phone] °Web site: [email or web address] ° °

## 2015-02-20 NOTE — Progress Notes (Signed)
1 Day Post-Op  Subjective: She is sore,but otherwise doing OK, breakfast just came so she is going to eat now.  Objective: Vital signs in last 24 hours: Temp:  [97.6 F (36.4 C)-98.7 F (37.1 C)] 98.5 F (36.9 C) (10/14 0613) Pulse Rate:  [69-99] 92 (10/14 0613) Resp:  [15-20] 16 (10/14 0613) BP: (90-121)/(50-72) 103/54 mmHg (10/14 0613) SpO2:  [94 %-100 %] 96 % (10/14 0613) Last BM Date: 02/18/15 360 PO Regular diet Afebrile, VSS NO labs IOC:  No evidence of choledocholithiasis.  Intake/Output from previous day: 10/13 0701 - 10/14 0700 In: 2262.5 [P.O.:360; I.V.:1902.5] Out: 1800 [Urine:1800] Intake/Output this shift:    General appearance: alert, cooperative and no distress GI: soft, sore sites are OK.  Lab Results:   Recent Labs  02/19/15 0115  WBC 10.2  HGB 13.6  HCT 41.4  PLT 271    BMET  Recent Labs  02/19/15 0115  NA 139  K 4.2  CL 106  CO2 26  GLUCOSE 101*  BUN 15  CREATININE 0.94  CALCIUM 9.4   PT/INR No results for input(s): LABPROT, INR in the last 72 hours.   Recent Labs Lab 02/13/15 2232 02/15/15 0030 02/19/15 0115  AST 19 15 18   ALT 14 12* 14  ALKPHOS 78 73 78  BILITOT 0.5 0.3 0.6  PROT 7.4 7.1 7.9  ALBUMIN 4.4 3.9 4.5     Lipase     Component Value Date/Time   LIPASE 32 02/19/2015 0115     Studies/Results: Dg Cholangiogram Operative  02/19/2015  CLINICAL DATA:  Laparoscopic cholecystectomy for right upper quadrant abdominal pain. Evaluate for choledocholithiasis EXAM: INTRAOPERATIVE CHOLANGIOGRAM FLUOROSCOPY TIME:  12 seconds COMPARISON:  Abdominal ultrasound - 02/19/2015; CT abdomen and pelvis - 06/24/2014 FINDINGS: Intraoperative cholangiographic images of the right upper abdominal quadrant during laparoscopic cholecystectomy are provided for review. Surgical clips overlie the expected location of the gallbladder fossa. Contrast injection demonstrates selective cannulation of the central aspect of the cystic duct.  There is passage of contrast through the central aspect of the cystic duct with filling of a non dilated common bile duct. There is passage of contrast though the CBD and into the descending portion of the duodenum. There is minimal reflux of injected contrast into the common hepatic duct and central aspect of the non dilated intrahepatic biliary system. There are no discrete filling defects within the opacified portions of the biliary system to suggest the presence of choledocholithiasis. IMPRESSION: No evidence of choledocholithiasis. Electronically Signed   By: Simonne ComeJohn  Watts M.D.   On: 02/19/2015 16:32   [place] Abdomen Complete  02/19/2015  CLINICAL DATA:  24 year old female with gallstones. 5 hr of acute right upper quadrant abdominal pain. Low-grade fever. Initial encounter. EXAM: ULTRASOUND ABDOMEN COMPLETE COMPARISON:  02/13/2015. FINDINGS: Gallbladder: Cholelithiasis re- demonstrated, individual stones up to 7 mm diameter. New gallbladder wall thickening and positive sonographic Murphy sign. Common bile duct: Diameter: 2 mm, normal Liver: No focal lesion identified. Within normal limits in parenchymal echogenicity. No intrahepatic biliary ductal dilatation. IVC: No abnormality visualized. Pancreas: Within normal limits. Spleen: Size and appearance within normal limits. Right Kidney: Length: 11.5 cm . Echogenicity within normal limits. No mass or hydronephrosis visualized. Left Kidney: Length: 11.2 cm. Echogenicity within normal limits. No mass or hydronephrosis visualized. Abdominal aorta: No aneurysm visualized. Other findings: None. IMPRESSION: 1. Acute Cholecystitis.  Individual gallstones up to 7 mm. 2. Otherwise negative abdomen ultrasound. Electronically Signed   By: Odessa FlemingH  Hall M.D.   On: 02/19/2015  00:45    Medications: . busPIRone  7.5 mg Oral BID  . cholecalciferol  1,000 Units Oral Daily  . docusate sodium  100 mg Oral BID  . mupirocin ointment   Nasal BID    Assessment/Plan Acute  cholecystitis/cholelithiasis S/p Laparoscopic cholecystectomy with intra-operative cholangiography, 02/19/15, Dr. Gerrit Friends Hx of prior C Sections Hypothyroid Hx of depression/anxiety Hx of bronchitis Hx of nephrolithiasis    Plan:  I am going to let her eat and if she does well with ambulation, PO's, and pain med, she can go home later this AM.      Sherrie George 02/20/2015

## 2015-02-20 NOTE — Progress Notes (Signed)
Pt is being discharged home. Discharge instructions were given to patient and family. Patient is awaiting her grand mother so she can leave

## 2015-02-23 ENCOUNTER — Encounter (HOSPITAL_COMMUNITY): Payer: Self-pay | Admitting: Surgery

## 2015-02-26 NOTE — Discharge Summary (Signed)
Physician Discharge Summary  Patient ID: Faith Beck MRN: [id number] DOB/AGE: 08-03-1990 24 y.o.  Admit date: 02/18/2015 Discharge date: 02/20/2015  Admission Diagnoses:  Acute cholecystitis/cholelithiasis Hx of prior C Sections Hypothyroid Hx of depression/anxiety Hx of bronchitis Hx of nephrolithiasis  Discharge Diagnoses:  Active Problems:   Cholecystitis   Cholecystitis, acute   Gallbladder calculus with acute cholecystitis   PROCEDURES: S/p Laparoscopic cholecystectomy with intra-operative cholangiography, 02/19/15, Dr. Laurin CoderGerkin  Hospital Course: This is a 24 year old female who is here for days ago for abdominal pain and was diagnosed with biliary colic. She was discharged home on Zofran and Vicodin. She has had persistent pain since that visit which acutely worsened yesterday evening about 8 PM. The pain is in the right upper quadrant and radiates to the right shoulder. It is worse with movement or palpation. There is been associated multiple episodes of emesis. She is not aware of having a fever.  She was admitted by Dr. Maisie Fushomas and taken to the OR later that day.  Pt tolerated the procedure well and was ready for discharge the following day.  Condition on d/c:  Improved  CBC Latest Ref Rng 02/19/2015 02/15/2015 02/13/2015  WBC 4.0 - 10.5 K/uL 10.2 9.0 10.1  Hemoglobin 12.0 - 15.0 g/dL 46.913.6 62.913.1 52.813.6  Hematocrit 36.0 - 46.0 % 41.4 40.2 41.8  Platelets 150 - 400 K/uL 271 275 296    CMP Latest Ref Rng 02/19/2015 02/15/2015 02/13/2015  Glucose 65 - 99 mg/dL 413(K101(H) 93 84  BUN 6 - 20 mg/dL 15 14 19   Creatinine 0.44 - 1.00 mg/dL 4.400.94 1.020.77 7.250.81  Sodium 135 - 145 mmol/L 139 137 138  Potassium 3.5 - 5.1 mmol/L 4.2 4.0 3.9  Chloride 101 - 111 mmol/L 106 108 107  CO2 22 - 32 mmol/L 26 25 25   Calcium 8.9 - 10.3 mg/dL 9.4 9.2 9.4  Total Protein 6.5 - 8.1 g/dL 7.9 7.1 7.4  Total Bilirubin 0.3 - 1.2 mg/dL 0.6 0.3 0.5  Alkaline Phos 38 - 126 U/L 78 73 78  AST 15 - 41 U/L 18 15  19   ALT 14 - 54 U/L 14 12(L) 14    Disposition: 01-Home or Self Care     Medication List    STOP taking these medications        azithromycin 250 MG tablet  Commonly known as:  ZITHROMAX Z-PAK     penicillin v potassium 500 MG tablet  Commonly known as:  VEETID     saccharomyces boulardii 250 MG capsule  Commonly known as:  FLORASTOR      TAKE these medications        baclofen 5 mg Tabs tablet  Commonly known as:  LIORESAL  Take 5 mg by mouth 2 (two) times daily as needed for muscle spasms.     benzonatate 100 MG capsule  Commonly known as:  TESSALON  Take 1 capsule (100 mg total) by mouth every 8 (eight) hours.     busPIRone 7.5 MG tablet  Commonly known as:  BUSPAR  Take 7.5 mg by mouth 2 (two) times daily.     cholecalciferol 1000 UNITS tablet  Commonly known as:  VITAMIN D  Take 1,000 Units by mouth daily.     etonogestrel 68 MG Impl implant  Commonly known as:  NEXPLANON  1 each by Subdermal route continuous.     fluticasone 50 MCG/ACT nasal spray  Commonly known as:  FLONASE  Place 2 sprays into the nose daily.  HYDROcodone-acetaminophen 5-325 MG tablet  Commonly known as:  NORCO  Take 1-2 tablets by mouth every 6 (six) hours as needed.     ibuprofen 200 MG tablet  Commonly known as:  ADVIL,MOTRIN  You can take 2-3 tablets every 6 hours for pain as needed, safely.  The higher dose can lead to problems.     levothyroxine 25 MCG tablet  Commonly known as:  SYNTHROID, LEVOTHROID  Take 25 mcg by mouth daily before breakfast.     omeprazole 20 MG capsule  Commonly known as:  PRILOSEC  Take 20 mg by mouth daily.     ondansetron 4 MG disintegrating tablet  Commonly known as:  ZOFRAN ODT  Take 1 tablet (4 mg total) by mouth every 8 (eight) hours as needed for nausea or vomiting.           Follow-up Information    Follow up with [facility] On 03/04/2015.   Specialty:  General Surgery   Why:  Your appointment is set up for 12:15  PM, be there at 11:45 AM for check in.   Contact information:   [address] [place] [address] [phone]       Follow up with PHILIP, Alonza Smoker, MD.   Specialty:  Family Medicine   Why:  Call and let them know you had surgery and follow up for medical issues.   Contact information:   [address] [place] [address] [phone]       Signed: Sherrie George 02/26/2015, 2:29 PM

## 2015-11-07 ENCOUNTER — Encounter (HOSPITAL_COMMUNITY): Payer: Self-pay | Admitting: *Deleted

## 2015-11-07 ENCOUNTER — Inpatient Hospital Stay (HOSPITAL_COMMUNITY)
Admission: AD | Admit: 2015-11-07 | Discharge: 2015-11-08 | Disposition: A | Discharge status: Home / Self Care | Payer: Medicaid Other | Source: Ambulatory Visit | Attending: Family Medicine | Admitting: Family Medicine

## 2015-11-07 DIAGNOSIS — M545 Low back pain: Secondary | ICD-10-CM | POA: Diagnosis present

## 2015-11-07 DIAGNOSIS — O26832 Pregnancy related renal disease, second trimester: Secondary | ICD-10-CM | POA: Insufficient documentation

## 2015-11-07 DIAGNOSIS — Z87442 Personal history of urinary calculi: Secondary | ICD-10-CM | POA: Insufficient documentation

## 2015-11-07 DIAGNOSIS — O99512 Diseases of the respiratory system complicating pregnancy, second trimester: Secondary | ICD-10-CM | POA: Insufficient documentation

## 2015-11-07 DIAGNOSIS — Z3A25 25 weeks gestation of pregnancy: Secondary | ICD-10-CM | POA: Insufficient documentation

## 2015-11-07 DIAGNOSIS — F329 Major depressive disorder, single episode, unspecified: Secondary | ICD-10-CM | POA: Diagnosis not present

## 2015-11-07 DIAGNOSIS — J45909 Unspecified asthma, uncomplicated: Secondary | ICD-10-CM | POA: Diagnosis not present

## 2015-11-07 DIAGNOSIS — O99342 Other mental disorders complicating pregnancy, second trimester: Secondary | ICD-10-CM | POA: Diagnosis not present

## 2015-11-07 DIAGNOSIS — N200 Calculus of kidney: Secondary | ICD-10-CM

## 2015-11-07 DIAGNOSIS — Z79899 Other long term (current) drug therapy: Secondary | ICD-10-CM | POA: Insufficient documentation

## 2015-11-07 LAB — URINALYSIS, ROUTINE W REFLEX MICROSCOPIC
Bilirubin Urine: NEGATIVE
Glucose, UA: NEGATIVE mg/dL
Hgb urine dipstick: NEGATIVE
Ketones, ur: NEGATIVE mg/dL
NITRITE: NEGATIVE
PH: 5.5 (ref 5.0–8.0)
Protein, ur: NEGATIVE mg/dL

## 2015-11-07 LAB — URINE MICROSCOPIC-ADD ON: RBC / HPF: NONE SEEN RBC/hpf (ref 0–5)

## 2015-11-07 MED ORDER — OXYCODONE-ACETAMINOPHEN 5-325 MG PO TABS
1.0000 | ORAL_TABLET | Freq: Once | ORAL | Status: AC
  Administered 2015-11-08: 1 via ORAL
  Filled 2015-11-07: qty 1

## 2015-11-07 NOTE — MAU Provider Note (Signed)
History     CSN: [id number]  Arrival date and time: 11/07/15 2246   None     Chief Complaint  Patient presents with  . Nephrolithiasis   HPI  Ms. Faith Beck is 25 yo G3P2 at 10666w6d presenting with low back pain that is similar to past symptoms of symptomatic nephrolithiasis.  Reports she started having intermittent sharp back pain, intermittent hematuria; was seen by her OB [facility]([place] Dr. Shawnie Ponsorn) who prescribed her Percocet PRN. Since this afternoon, her back pain has been constant. She had an episode of feeling hot, diaphoretic, lightheaded today associated with left back pain. She also has pain with urination and increased back pain with urination; she also feels that she could not fully empty her bladder. No fevers. Has history of UTI but no UTI during this pregnancy. She ran out of Percocet a few days ago; Reports the [employer]et ususaly eases her pain but does not fully resolve it. Reports she has been able to tolerate fluids (water and cranberry juice) but no solid food today. [place] this [place]   OB History    Gravida Para Term Preterm AB TAB SAB Ectopic Multiple Living   3         2      Past Medical History  Diagnosis Date  . Depression   . Asthma   . Bronchitis   . Depression   . Kidney stones     Past Surgical History  Procedure Laterality Date  . Cesarean section    . Dilation and curettage of uterus    . Cholecystectomy N/A 02/19/2015    Procedure: LAPAROSCOPIC CHOLECYSTECTOMY WITH INTRAOPERATIVE CHOLANGIOGRAM;  Surgeon: Darnell Levelodd Gerkin, MD;  Location: [facility];  Service: General;  Laterality: N/A;    History reviewed. No pertinent family history.  Social History  Substance Use Topics  . Smoking status: Never Smoker   . Smokeless tobacco: None  . Alcohol Use: No    Allergies: No Known Allergies  Prescriptions prior to admission  Medication Sig Dispense Refill Last Dose  . Prenatal Vit-Fe Fumarate-FA (PRENATAL MULTIVITAMIN) TABS  tablet Take 1 tablet by mouth daily at 12 noon.   11/07/2015 at Unknown time  . baclofen (LIORESAL) 5 mg TABS tablet Take 5 mg by mouth 2 (two) times daily as needed for muscle spasms.   02/18/2015 at Unknown time  . benzonatate (TESSALON) 100 MG capsule Take 1 capsule (100 mg total) by mouth every 8 (eight) hours. (Patient not taking: Reported on 02/13/2015) 21 capsule 0   . busPIRone (BUSPAR) 7.5 MG tablet Take 7.5 mg by mouth 2 (two) times daily.   Past Week at Unknown time  . cholecalciferol (VITAMIN D) 1000 UNITS tablet Take 1,000 Units by mouth daily.   02/18/2015 at Unknown time  . etonogestrel (NEXPLANON) 68 MG IMPL implant 1 each by Subdermal route continuous.    unknown at unknown  . fluticasone (FLONASE) 50 MCG/ACT nasal spray Place 2 sprays into the nose daily. (Patient not taking: Reported on 02/13/2015) 16 g 0   . HYDROcodone-acetaminophen (NORCO) 5-325 MG tablet Take 1-2 tablets by mouth every 6 (six) hours as needed. 40 tablet 0   . ibuprofen (ADVIL,MOTRIN) 200 MG tablet You can take 2-3 tablets every 6 hours for pain as needed, safely.  The higher dose can lead to problems.     Marland Kitchen. levothyroxine (SYNTHROID, LEVOTHROID) 25 MCG tablet Take 25 mcg by mouth daily before breakfast.   Past Week at  Unknown time  . omeprazole (PRILOSEC) 20 MG capsule Take 20 mg by mouth daily.   02/18/2015 at Unknown time  . ondansetron (ZOFRAN ODT) 4 MG disintegrating tablet Take 1 tablet (4 mg total) by mouth every 8 (eight) hours as needed for nausea or vomiting. 12 tablet 0 02/18/2015 at Unknown time    ROS Physical Exam   Blood pressure 123/81, pulse 80, temperature 98 F (36.7 C), resp. rate 20, height 5\' 3"  (1.6 m), weight 110.224 kg (243 lb).  Physical Exam GEN: NAD CV: RRR, no murmurs, rubs, or gallops PULM: CTAB, normal effort ABD: Soft, gravid, some suprapubic tenderness to palpation, no guarding or rebound.  CVA tenderness bilaterally.  SKIN: No rash or cyanosis; warm and well-perfused EXTR:  No lower extremity edema or calf tenderness PSYCH: Mood and affect euthymic, normal rate and volume of speech NEURO: Awake, alert, no focal deficits grossly, normal speech  MAU Course  Procedures  MDM Symptoms consistent with prior episodes of nephrolithiasis. UA without hemoglobin. UA with only moderate LE. Patient is afebrile with stable vitals. Non-toxic appearing on exam.  Will obtain renal US Percocet for pain  Urine culture Fetal Monitoring: reassuring   Assessment and Plan  Patient presenting with symptomatic nephrolithiasis. Pain is controlled with Percocet in MAU. Renal US with nephrolithiasis that is nonobstructing. UA with moderate Leukocytes, urine sent for culture.  Will discharge with Norco, Zofran, and Flomax. Recommended calling OB on Monday for close follow up appointment. May need referral to urology. Recommended to continue oral hydration. Return precautions discussed.  Palma HolterKanishka G Gunadasa 11/07/2015, 11:53 PM   I have this patient, and examined her with the resident. I agree with the above note and plan.   Tawnya CrookHogan, Heather Donovan  2:23 AM 11/08/2015

## 2015-11-07 NOTE — MAU Note (Signed)
Mentioned to my doc few wks ago thought my kidney stones were acting up. Was driving today and started sweating and thought i was gonna pass out. Also having back pain. Hurts to urinate. Have flank pain more on L but sometimes on R. Have stones both sides. Some R shoulder pain. Denies vag bleeding. Some clear/white vag d/c. Some blood in urine

## 2015-11-08 ENCOUNTER — Inpatient Hospital Stay (HOSPITAL_COMMUNITY): Payer: Medicaid Other

## 2015-11-08 MED ORDER — ONDANSETRON 4 MG PO TBDP
4.0000 mg | ORAL_TABLET | Freq: Three times a day (TID) | ORAL | Status: DC | PRN
Start: 2015-11-08 — End: 2016-08-10

## 2015-11-08 MED ORDER — TAMSULOSIN HCL 0.4 MG PO CAPS: 0.4000 mg | ORAL_CAPSULE | Freq: Every day | ORAL | Status: DC

## 2015-11-08 MED ORDER — HYDROCODONE-ACETAMINOPHEN 5-325 MG PO TABS: 1.0000 | ORAL_TABLET | Freq: Four times a day (QID) | ORAL | Status: DC | PRN

## 2015-11-08 NOTE — Discharge Instructions (Signed)
You were seem for pain due to kidney stones. Your ultrasound of your kidneys did show kidney stones but no sign of obstruction. We provided you with prescription of Percocet for pain, Zofran for nausea, and Flomax to help with bladder emptying. Please call your OB's office to make a close follow up appointment.

## 2015-11-09 LAB — URINE CULTURE

## 2015-12-24 DIAGNOSIS — N1330 Unspecified hydronephrosis: Secondary | ICD-10-CM | POA: Insufficient documentation

## 2016-08-10 ENCOUNTER — Emergency Department (HOSPITAL_COMMUNITY): Payer: Self-pay

## 2016-08-10 ENCOUNTER — Encounter (HOSPITAL_COMMUNITY): Payer: Self-pay | Admitting: *Deleted

## 2016-08-10 ENCOUNTER — Emergency Department (HOSPITAL_COMMUNITY)
Admission: EM | Admit: 2016-08-10 | Discharge: 2016-08-10 | Disposition: A | Discharge status: Home / Self Care | Payer: Self-pay | Attending: Emergency Medicine | Admitting: Emergency Medicine

## 2016-08-10 DIAGNOSIS — R109 Unspecified abdominal pain: Secondary | ICD-10-CM

## 2016-08-10 DIAGNOSIS — N200 Calculus of kidney: Secondary | ICD-10-CM | POA: Insufficient documentation

## 2016-08-10 DIAGNOSIS — J45909 Unspecified asthma, uncomplicated: Secondary | ICD-10-CM | POA: Insufficient documentation

## 2016-08-10 LAB — URINALYSIS, ROUTINE W REFLEX MICROSCOPIC
Bacteria, UA: NONE SEEN
Bilirubin Urine: NEGATIVE
GLUCOSE, UA: NEGATIVE mg/dL
KETONES UR: NEGATIVE mg/dL
Leukocytes, UA: NEGATIVE
NITRITE: NEGATIVE
PH: 9 — AB (ref 5.0–8.0)
Protein, ur: 30 mg/dL — AB
SPECIFIC GRAVITY, URINE: 1.016 (ref 1.005–1.030)

## 2016-08-10 MED ORDER — FENTANYL CITRATE (PF) 100 MCG/2ML IJ SOLN
100.0000 ug | Freq: Once | INTRAMUSCULAR | Status: AC
  Administered 2016-08-10: 100 ug via INTRAVENOUS
  Filled 2016-08-10: qty 2

## 2016-08-10 MED ORDER — PROMETHAZINE HCL 25 MG/ML IJ SOLN
12.5000 mg | Freq: Once | INTRAMUSCULAR | Status: AC
  Administered 2016-08-10: 12.5 mg via INTRAVENOUS
  Filled 2016-08-10: qty 1

## 2016-08-10 MED ORDER — ONDANSETRON 4 MG PO TBDP
4.0000 mg | ORAL_TABLET | Freq: Once | ORAL | Status: AC
  Administered 2016-08-10: 4 mg via ORAL
  Filled 2016-08-10: qty 1

## 2016-08-10 MED ORDER — KETOROLAC TROMETHAMINE 60 MG/2ML IM SOLN
60.0000 mg | Freq: Once | INTRAMUSCULAR | Status: AC
  Administered 2016-08-10: 60 mg via INTRAMUSCULAR
  Filled 2016-08-10: qty 2

## 2016-08-10 MED ORDER — ONDANSETRON HCL 4 MG PO TABS: 4.0000 mg | ORAL_TABLET | Freq: Three times a day (TID) | ORAL | 0 refills | Status: DC | PRN

## 2016-08-10 MED ORDER — IBUPROFEN 800 MG PO TABS: 800.0000 mg | ORAL_TABLET | Freq: Three times a day (TID) | ORAL | 0 refills | Status: DC

## 2016-08-10 MED ORDER — HYDROCODONE-ACETAMINOPHEN 5-325 MG PO TABS: 1.0000 | ORAL_TABLET | ORAL | 0 refills | Status: DC | PRN

## 2016-08-10 MED ORDER — TAMSULOSIN HCL 0.4 MG PO CAPS
0.4000 mg | ORAL_CAPSULE | Freq: Once | ORAL | Status: AC
Start: 2016-08-10 — End: 2016-08-10
  Administered 2016-08-10: 0.4 mg via ORAL
  Filled 2016-08-10: qty 1

## 2016-08-10 MED ORDER — HYDROCODONE-ACETAMINOPHEN 5-325 MG PO TABS
1.0000 | ORAL_TABLET | Freq: Once | ORAL | Status: AC
Start: 2016-08-10 — End: 2016-08-10
  Administered 2016-08-10: 1 via ORAL
  Filled 2016-08-10: qty 1

## 2016-08-10 MED ORDER — TAMSULOSIN HCL 0.4 MG PO CAPS: 0.4000 mg | ORAL_CAPSULE | Freq: Every day | ORAL | 0 refills | Status: DC

## 2016-08-10 NOTE — ED Provider Notes (Signed)
[facility]PT Provider Note   CSN: [id number] Arrival date & time: 08/10/16  1444     History   Chief Complaint Chief Complaint  Patient presents with  . Flank Pain    HPI Faith Beck is a 26 y.o. female.   Flank Pain  This is a recurrent problem. The current episode started 6 to 12 hours ago. The problem occurs constantly. Associated symptoms include abdominal pain (left lower and left flank). Nothing aggravates the symptoms. Nothing relieves the symptoms. She has tried nothing for the symptoms.    Past Medical History:  Diagnosis Date  . Asthma   . Bronchitis   . Depression   . Depression   . Kidney stones     Patient Active Problem List   Diagnosis Date Noted  . Cholecystitis 02/19/2015  . Cholecystitis, acute 02/19/2015  . Gallbladder calculus with acute cholecystitis 02/19/2015    Past Surgical History:  Procedure Laterality Date  . CESAREAN SECTION    . CHOLECYSTECTOMY N/A 02/19/2015   Procedure: LAPAROSCOPIC CHOLECYSTECTOMY WITH INTRAOPERATIVE CHOLANGIOGRAM;  Surgeon: Darnell Level, MD;  Location: [facility];  Service: General;  Laterality: N/A;  . DILATION AND CURETTAGE OF UTERUS    . TUBAL LIGATION      OB History    Gravida Para Term Preterm AB Living   3         2   SAB TAB Ectopic Multiple Live Births           2       Home Medications    Prior to Admission medications   Medication Sig Start Date End Date Taking? Authorizing Provider  HYDROcodone-acetaminophen (NORCO/VICODIN) 5-325 MG tablet Take 1-2 tablets by mouth every 4 (four) hours as needed for severe pain. 08/10/16   Marily Memos, MD  ibuprofen (ADVIL,MOTRIN) 800 MG tablet Take 1 tablet (800 mg total) by mouth 3 (three) times daily. 08/10/16   Marily Memos, MD  ondansetron (ZOFRAN) 4 MG tablet Take 1 tablet (4 mg total) by mouth every 8 (eight) hours as needed for nausea or vomiting. 08/10/16   Marily Memos, MD  tamsulosin (FLOMAX) 0.4 MG CAPS capsule Take 1 capsule (0.4 mg total)  by mouth daily. 08/10/16   Marily Memos, MD    Family History No family history on file.  Social History Social History  Substance Use Topics  . Smoking status: Never Smoker  . Smokeless tobacco: Never Used  . Alcohol use No     Allergies   Patient has no known allergies.   Review of Systems Review of Systems  Gastrointestinal: Positive for abdominal pain (left lower and left flank).  Genitourinary: Positive for decreased urine volume, dysuria, flank pain, frequency and hematuria.  All other systems reviewed and are negative.    Physical Exam Updated Vital Signs BP 96/64   Pulse 62   Temp 97.8 F (36.6 C) (Oral)   Resp 20   Ht  (1.6 m)   Wt 215 lb (97.5 kg)   LMP 08/03/2016   SpO2 92%   Breastfeeding? Unknown   BMI 38.09 kg/m   Physical Exam  Constitutional: She appears well-developed and well-nourished.  HENT:  Head: Normocephalic and atraumatic.  Eyes: Conjunctivae and EOM are normal.  Neck: Normal range of motion.  Cardiovascular: Normal rate and regular rhythm.   Pulmonary/Chest: No stridor. No respiratory distress.  Abdominal: Soft. She exhibits no distension.  Musculoskeletal: She exhibits tenderness (left cva).  Neurological: She is alert.  Skin: Skin is  warm and dry.  Nursing note and vitals reviewed.    ED Treatments / Results  Labs (all labs ordered are listed, but only abnormal results are displayed) Labs Reviewed  URINALYSIS, ROUTINE W REFLEX MICROSCOPIC - Abnormal; Notable for the following:       Result Value   APPearance CLOUDY (*)    pH 9.0 (*)    Hgb urine dipstick LARGE (*)    Protein, ur 30 (*)    Squamous Epithelial / LPF 0-5 (*)    All other components within normal limits    EKG  EKG Interpretation None       Radiology Ct Renal Stone Study  Result Date: 08/10/2016 CLINICAL DATA:  LEFT flank pain which awoke her from sleep at 0300 hours today, trouble urinating, history kidney stones EXAM: CT ABDOMEN AND PELVIS  WITHOUT CONTRAST TECHNIQUE: Multidetector CT imaging of the abdomen and pelvis was performed following the standard protocol without IV contrast. Sagittal and coronal MPR images reconstructed from axial data set. Oral contrast was not administered COMPARISON:  06/24/2014 FINDINGS: Lower chest: Lung bases clear Hepatobiliary: Gallbladder surgically absent.  Liver unremarkable. Pancreas: Normal appearance Spleen: Normal appearance with small adjacent splenule noted Adrenals/Urinary Tract: Adrenal glands unremarkable. Tiny BILATERAL nonobstructing renal calculi. Mild LEFT hydronephrosis and hydroureter secondary to a tiny proximal LEFT ureteral calculus 2 mm diameter. Kidneys, remaining ureters, and bladder otherwise normal appearance. Stomach/Bowel: Normal appendix. Stomach and bowel loops normal appearance for technique. Vascular/Lymphatic: Unremarkable Reproductive: Uterus unremarkable. Small cyst LEFT ovary 2.1 x 1.6 cm. Larger cyst RIGHT ovary 3.9 x 4.3 cm Other: No free air or free fluid.  No hernia. Musculoskeletal: Unremarkable IMPRESSION: Multiple tiny BILATERAL nonobstructing renal calculi. Mild LEFT hydronephrosis and hydroureter secondary to a 2 mm proximal LEFT ureteral calculus. BILATERAL ovarian cysts larger on RIGHT. Electronically Signed   By: Ulyses Southward M.D.   On: 08/10/2016 20:18    Procedures Procedures (including critical care time)  Medications Ordered in ED Medications  HYDROcodone-acetaminophen (NORCO/VICODIN) 5-325 MG per tablet 1 tablet (1 tablet Oral Given 08/10/16 1823)  ondansetron (ZOFRAN-ODT) disintegrating tablet 4 mg (4 mg Oral Given 08/10/16 1822)  ketorolac (TORADOL) injection 60 mg (60 mg Intramuscular Given 08/10/16 1823)  fentaNYL (SUBLIMAZE) injection 100 mcg (100 mcg Intravenous Given 08/10/16 2042)  promethazine (PHENERGAN) injection 12.5 mg (12.5 mg Intravenous Given 08/10/16 2041)  tamsulosin (FLOMAX) capsule 0.4 mg (0.4 mg Oral Given 08/10/16 2042)     Initial  Impression / Assessment and Plan / ED Course  I have reviewed the triage vital signs and the nursing notes.  Pertinent labs & imaging results that were available during my care of the patient were reviewed by me and considered in my medical decision making (see chart for details).     Patient with kidney stone on CT scan. Small enough that should pass on to however will follow-up with urology if not improving in about 5 days. Patient's pain controlled nausea controlled while in the emergency department. Stable for discharge and continued outpatient management.  Final Clinical Impressions(s) / ED Diagnoses   Final diagnoses:  Flank pain  Kidney stone    New Prescriptions New Prescriptions   HYDROCODONE-ACETAMINOPHEN (NORCO/VICODIN) 5-325 MG TABLET    Take 1-2 tablets by mouth every 4 (four) hours as needed for severe pain.   IBUPROFEN (ADVIL,MOTRIN) 800 MG TABLET    Take 1 tablet (800 mg total) by mouth 3 (three) times daily.   ONDANSETRON (ZOFRAN) 4 MG TABLET    Take  1 tablet (4 mg total) by mouth every 8 (eight) hours as needed for nausea or vomiting.   TAMSULOSIN (FLOMAX) 0.4 MG CAPS CAPSULE    Take 1 capsule (0.4 mg total) by mouth daily.     Marily Memos, MD 08/10/16 2209

## 2016-08-10 NOTE — ED Triage Notes (Signed)
Pt comes in with left side flank pain that woke her up from her sleep at 0300 today. Pt has had trouble urinating today.

## 2016-08-10 NOTE — ED Notes (Signed)
Informed edp pt was having pain and nausea.

## 2017-01-17 ENCOUNTER — Encounter (HOSPITAL_COMMUNITY): Payer: Self-pay | Admitting: *Deleted

## 2017-01-17 ENCOUNTER — Emergency Department (HOSPITAL_COMMUNITY): Payer: Self-pay

## 2017-01-17 ENCOUNTER — Emergency Department (HOSPITAL_COMMUNITY)
Admission: EM | Admit: 2017-01-17 | Discharge: 2017-01-17 | Disposition: A | Discharge status: Home / Self Care | Payer: Self-pay | Attending: Emergency Medicine | Admitting: Emergency Medicine

## 2017-01-17 DIAGNOSIS — R109 Unspecified abdominal pain: Secondary | ICD-10-CM

## 2017-01-17 DIAGNOSIS — R1031 Right lower quadrant pain: Secondary | ICD-10-CM | POA: Insufficient documentation

## 2017-01-17 DIAGNOSIS — J45909 Unspecified asthma, uncomplicated: Secondary | ICD-10-CM | POA: Insufficient documentation

## 2017-01-17 LAB — URINALYSIS, ROUTINE W REFLEX MICROSCOPIC
BACTERIA UA: NONE SEEN
Bilirubin Urine: NEGATIVE
GLUCOSE, UA: NEGATIVE mg/dL
Hgb urine dipstick: NEGATIVE
KETONES UR: NEGATIVE mg/dL
Nitrite: NEGATIVE
Protein, ur: NEGATIVE mg/dL
SPECIFIC GRAVITY, URINE: 1.016 (ref 1.005–1.030)
pH: 5 (ref 5.0–8.0)

## 2017-01-17 LAB — I-STAT CHEM 8, ED
BUN: 12 mg/dL (ref 6–20)
CALCIUM ION: 1.11 mmol/L — AB (ref 1.15–1.40)
CHLORIDE: 106 mmol/L (ref 101–111)
CREATININE: 0.7 mg/dL (ref 0.44–1.00)
Glucose, Bld: 88 mg/dL (ref 65–99)
HCT: 34 % — ABNORMAL LOW (ref 36.0–46.0)
Hemoglobin: 11.6 g/dL — ABNORMAL LOW (ref 12.0–15.0)
Potassium: 3.3 mmol/L — ABNORMAL LOW (ref 3.5–5.1)
Sodium: 140 mmol/L (ref 135–145)
TCO2: 21 mmol/L — AB (ref 22–32)

## 2017-01-17 LAB — PREGNANCY, URINE: Preg Test, Ur: NEGATIVE

## 2017-01-17 MED ORDER — ONDANSETRON HCL 4 MG/2ML IJ SOLN
4.0000 mg | Freq: Once | INTRAMUSCULAR | Status: AC
  Administered 2017-01-17: 4 mg via INTRAVENOUS

## 2017-01-17 MED ORDER — KETOROLAC TROMETHAMINE 30 MG/ML IJ SOLN
30.0000 mg | Freq: Once | INTRAMUSCULAR | Status: AC
  Administered 2017-01-17: 30 mg via INTRAVENOUS
  Filled 2017-01-17: qty 1

## 2017-01-17 MED ORDER — ONDANSETRON HCL 4 MG/2ML IJ SOLN
INTRAMUSCULAR | Status: AC
  Filled 2017-01-17: qty 2

## 2017-01-17 MED ORDER — IOPAMIDOL (ISOVUE-300) INJECTION 61%
100.0000 mL | Freq: Once | INTRAVENOUS | Status: AC | PRN
  Administered 2017-01-17: 100 mL via INTRAVENOUS

## 2017-01-17 MED ORDER — NAPROXEN 500 MG PO TABS: ORAL_TABLET | ORAL | 0 refills | Status: DC

## 2017-01-17 MED ORDER — METHOCARBAMOL 500 MG PO TABS: ORAL_TABLET | ORAL | 0 refills | Status: DC

## 2017-01-17 MED ORDER — FENTANYL CITRATE (PF) 100 MCG/2ML IJ SOLN
50.0000 ug | Freq: Once | INTRAMUSCULAR | Status: AC
  Administered 2017-01-17: 50 ug via INTRAVENOUS
  Filled 2017-01-17: qty 2

## 2017-01-17 MED ORDER — CYCLOBENZAPRINE HCL 10 MG PO TABS
10.0000 mg | ORAL_TABLET | Freq: Once | ORAL | Status: AC
  Administered 2017-01-17: 10 mg via ORAL
  Filled 2017-01-17: qty 1

## 2017-01-17 NOTE — Discharge Instructions (Signed)
Use ice and heat for comfort. Take the medications as prescribed. Your tests tonight are normal. You do have some kidney stones, but they are in the kidney and you are not passing them into the ureter, the tube that drains urine from the kidney. Recheck if you get a fever.

## 2017-01-17 NOTE — ED Triage Notes (Signed)
Pt c/o right flank pain that started yesterday; pt states she has trouble with starting and stopping her urine

## 2017-01-17 NOTE — ED Provider Notes (Signed)
[facility]-EMERGENCY DEPT Provider Note   CSN: [id number] Arrival date & time: 01/17/17  0024  Time seen 12:50 AM  History   Chief Complaint Chief Complaint  Patient presents with  . Flank Pain    HPI Faith Beck is a 26 y.o. female.  HPI  patient reports on September 9 Faith Beck started getting pain in her right flank area that is now radiating into her right lower quadrant. Faith Beck states right flank pain is constant and the right lower quadrant pain gets worse when Faith Beck has the urge to urinate. Faith Beck denies actual dysuria but does have frequency and has seen some blood off and on when Faith Beck urinates. Faith Beck states walking makes her flank pain hurt more, heat makes it feel a little bit better. Faith Beck denies any fever, or vomiting but has had some nausea. Faith Beck states Faith Beck's had similar pain before when Faith Beck had a kidney stone. Faith Beck states Faith Beck started having them in 2015 when Faith Beck was pregnant and has passed about 4 stones. Faith Beck states with her first one they had talked about lithotripsy however Faith Beck couldn't have it done because Faith Beck was pregnant, after Faith Beck delivered her baby Faith Beck no longer had insurance and could not afford it. Patient is status post BTL. Faith Beck reports a family history of kidney stones. Faith Beck states Faith Beck does drink caffeine but Faith Beck is trying to cut back, Faith Beck does not drink a lot of milk.  PCP none  Past Medical History:  Diagnosis Date  . Asthma   . Bronchitis   . Depression   . Depression   . Kidney stones     Patient Active Problem List   Diagnosis Date Noted  . Cholecystitis 02/19/2015  . Cholecystitis, acute 02/19/2015  . Gallbladder calculus with acute cholecystitis 02/19/2015    Past Surgical History:  Procedure Laterality Date  . CESAREAN SECTION    . CHOLECYSTECTOMY N/A 02/19/2015   Procedure: LAPAROSCOPIC CHOLECYSTECTOMY WITH INTRAOPERATIVE CHOLANGIOGRAM;  Surgeon: Darnell Levelodd Gerkin, MD;  Location: [facility];  Service: General;  Laterality: N/A;  . DILATION AND CURETTAGE OF UTERUS    .  TUBAL LIGATION      OB History    Gravida Para Term Preterm AB Living   3         2   SAB TAB Ectopic Multiple Live Births           2       Home Medications    Prior to Admission medications   Medication Sig Start Date End Date Taking? Authorizing Provider  methocarbamol (ROBAXIN) 500 MG tablet Take 1 or 2 po Q 6hrs for pain 01/17/17   Devoria AlbeKnapp, Loral Campi, MD  naproxen (NAPROSYN) 500 MG tablet Take 1 po BID with food prn pain 01/17/17   Devoria AlbeKnapp, Davine Sweney, MD    Family History History reviewed. No pertinent family history.  Social History Social History  Substance Use Topics  . Smoking status: Never Smoker  . Smokeless tobacco: Never Used  . Alcohol use No  unemployed   Allergies   Patient has no known allergies.   Review of Systems Review of Systems  All other systems reviewed and are negative.    Physical Exam Updated Vital Signs BP 123/80 (BP Location: Left Arm)   Pulse 96   Temp 98 F (36.7 C) (Oral)   Resp 18   Ht 5\' 3"  (1.6 m)   Wt 106.6 kg (235 lb)   LMP 01/01/2017   SpO2 100%   BMI 41.63 kg/m  Vital signs normal    Physical Exam  Constitutional: Faith Beck is oriented to person, place, and time. Faith Beck appears well-developed and well-nourished.  Non-toxic appearance. Faith Beck does not appear ill. No distress.  HENT:  Head: Normocephalic and atraumatic.  Right Ear: External ear normal.  Left Ear: External ear normal.  Nose: Nose normal. No mucosal edema or rhinorrhea.  Mouth/Throat: Oropharynx is clear and moist and mucous membranes are normal. No dental abscesses or uvula swelling.  Eyes: Pupils are equal, round, and reactive to light. Conjunctivae and EOM are normal.  Neck: Normal range of motion and full passive range of motion without pain. Neck supple.  Cardiovascular: Normal rate, regular rhythm and normal heart sounds.  Exam reveals no gallop and no friction rub.   No murmur heard. Pulmonary/Chest: Effort normal and breath sounds normal. No respiratory distress.  Faith Beck has no wheezes. Faith Beck has no rhonchi. Faith Beck has no rales. Faith Beck exhibits no tenderness and no crepitus.  Abdominal: Soft. Normal appearance and bowel sounds are normal. Faith Beck exhibits no distension. There is tenderness. There is no rebound and no guarding.    Genitourinary:  Genitourinary Comments: + RCVAT  Musculoskeletal: Normal range of motion. Faith Beck exhibits no edema or tenderness.  Moves all extremities well.   Neurological: Faith Beck is alert and oriented to person, place, and time. Faith Beck has normal strength. No cranial nerve deficit.  Skin: Skin is warm, dry and intact. No rash noted. No erythema. No pallor.  Psychiatric: Faith Beck has a normal mood and affect. Her speech is normal and behavior is normal. Her mood appears not anxious.  Nursing note and vitals reviewed.    ED Treatments / Results  Labs (all labs ordered are listed, but only abnormal results are displayed) Results for orders placed or performed during the hospital encounter of 01/17/17  Pregnancy, urine  Result Value Ref Range   Preg Test, Ur NEGATIVE NEGATIVE  Urinalysis, Routine w reflex microscopic  Result Value Ref Range   Color, Urine YELLOW YELLOW   APPearance CLEAR CLEAR   Specific Gravity, Urine 1.016 1.005 - 1.030   pH 5.0 5.0 - 8.0   Glucose, UA NEGATIVE NEGATIVE mg/dL   Hgb urine dipstick NEGATIVE NEGATIVE   Bilirubin Urine NEGATIVE NEGATIVE   Ketones, ur NEGATIVE NEGATIVE mg/dL   Protein, ur NEGATIVE NEGATIVE mg/dL   Nitrite NEGATIVE NEGATIVE   Leukocytes, UA TRACE (A) NEGATIVE   RBC / HPF 0-5 0 - 5 RBC/hpf   WBC, UA 0-5 0 - 5 WBC/hpf   Bacteria, UA NONE SEEN NONE SEEN   Squamous Epithelial / LPF 0-5 (A) NONE SEEN   Mucus PRESENT   I-stat Chem 8, ED  Result Value Ref Range   Sodium 140 135 - 145 mmol/L   Potassium 3.3 (L) 3.5 - 5.1 mmol/L   Chloride 106 101 - 111 mmol/L   BUN 12 6 - 20 mg/dL   Creatinine, Ser 1.61 0.44 - 1.00 mg/dL   Glucose, Bld 88 65 - 99 mg/dL   Calcium, Ion 0.96 (L) 1.15 - 1.40 mmol/L    TCO2 21 (L) 22 - 32 mmol/L   Hemoglobin 11.6 (L) 12.0 - 15.0 g/dL   HCT 04.5 (L) 40.9 - 81.1 %   Laboratory interpretation all normal      EKG  EKG Interpretation None       Radiology Ct Abdomen Pelvis W Contrast  Result Date: 01/17/2017 CLINICAL DATA:  Right flank pain starting yesterday. Difficulty starting and stopping urine. Renal stone CT earlier today.  EXAM: CT ABDOMEN AND PELVIS WITH CONTRAST TECHNIQUE: Multidetector CT imaging of the abdomen and pelvis was performed using the standard protocol following bolus administration of intravenous contrast. CONTRAST:  ISOVUE-300 IOPAMIDOL (ISOVUE-300) INJECTION 61% COMPARISON:  Noncontrast CT abdomen and pelvis 01/17/2017 and 08/10/2016. FINDINGS: Lower chest: The lung bases are clear. Small esophageal hiatal hernia. Prominent lymph node adjacent to the distal esophagus measures 1.4 cm in diameter. This was present on the previous studies without interval change. Hepatobiliary: No focal liver abnormality is seen. Status post cholecystectomy. No biliary dilatation. Pancreas: Unremarkable. No pancreatic ductal dilatation or surrounding inflammatory changes. Spleen: Normal in size without focal abnormality. Adrenals/Urinary Tract: No adrenal gland nodules. Small bilateral intrarenal stones. No hydronephrosis or hydroureter. Renal nephrograms are homogeneous. No ureteral stones or bladder stones identified. No bladder wall thickening. Stomach/Bowel: Stomach, small bowel, and colon are not abnormally distended. Scattered stool throughout the colon. No wall thickening appreciated. Appendix is normal. Vascular/Lymphatic: No significant vascular findings are present. No enlarged abdominal or pelvic lymph nodes. Reproductive: Uterus and ovaries are not enlarged. Probable involuting follicle in the right ovary. Small amount of free fluid in the pelvis is likely physiologic. Other: No free air in the abdomen. Abdominal wall musculature appears intact.  Focal scarring in the low pelvic anterior abdominal wall likely representing postoperative scar. Musculoskeletal: No acute or significant osseous findings. IMPRESSION: No acute process demonstrated in the abdomen or pelvis. Multiple tiny bilateral nonobstructing renal stones similar to previous study. Renal nephrograms are homogeneous and symmetrical. Electronically Signed   By: Burman Nieves M.D.   On: 01/17/2017 04:25   Ct Renal Stone Study  Result Date: 01/17/2017 CLINICAL DATA:  RIGHT flank pain. Difficulty urinating. History of nephrolithiasis. EXAM: CT ABDOMEN AND PELVIS WITHOUT CONTRAST TECHNIQUE: Multidetector CT imaging of the abdomen and pelvis was performed following the standard protocol without IV contrast. COMPARISON:  CT abdomen and pelvis August 10, 2016 FINDINGS: LOWER CHEST: Dependent atelectasis. Heart size is normal. No pericardial effusion. Stable 10 mm posterior mediastinal lymph node. HEPATOBILIARY: Status post cholecystectomy. PANCREAS: Normal. SPLEEN: Normal. ADRENALS/URINARY TRACT: Kidneys are orthotopic, demonstrating normal size and morphology. Bilateral nephrolithiasis measuring to 3 mm on the RIGHT. No hydronephrosis; limited assessment for renal masses on this nonenhanced examination. The unopacified ureters are normal in course and caliber. Urinary bladder is partially distended and unremarkable. Normal adrenal glands. STOMACH/BOWEL: Small hiatal hernia. The stomach, small and large bowel are normal in course and caliber without inflammatory changes, sensitivity decreased by lack of enteric contrast. Normal retrocecal appendix. VASCULAR/LYMPHATIC: Aortoiliac vessels are normal in course and caliber. No lymphadenopathy by CT size criteria. REPRODUCTIVE: Normal. OTHER: Small amount of free fluid in the pelvis, predominately around the RIGHT adnexa without focal fluid collection or subcutaneous gas. No discrete adnexal cysts, resolved from prior CT. MUSCULOSKELETAL: Non-acute.   Anterior pelvic wall scarring. IMPRESSION: 1. Small bilateral nonobstructing nephrolithiasis. 2. Small amount of free fluid in the pelvis, predominately about the RIGHT adnexae suggesting ruptured cyst. Normal appendix. Electronically Signed   By: Awilda Metro M.D.   On: 01/17/2017 02:16    Procedures Procedures (including critical care time)  Medications Ordered in ED Medications  ketorolac (TORADOL) 30 MG/ML injection 30 mg (30 mg Intravenous Given 01/17/17 0126)  ondansetron (ZOFRAN) injection 4 mg (4 mg Intravenous Given 01/17/17 0213)  cyclobenzaprine (FLEXERIL) tablet 10 mg (10 mg Oral Given 01/17/17 0318)  iopamidol (ISOVUE-300) 61 % injection 100 mL (100 mLs Intravenous Contrast Given 01/17/17 0359)  fentaNYL (SUBLIMAZE) injection 50  mcg (50 mcg Intravenous Given 01/17/17 0351)     Initial Impression / Assessment and Plan / ED Course  I have reviewed the triage vital signs and the nursing notes.  Pertinent labs & imaging results that were available during my care of the patient were reviewed by me and considered in my medical decision making (see chart for details).    Patient was given IV Toradol and Zofran for suspected kidney stone. CT scan renal was ordered. UA was done however Faith Beck does not have any signs of infection to suggest a pyelonephritis.  After reviewing patient's CT scan and went to talk to patient. Faith Beck is tearful and states Faith Beck is hurting more. Faith Beck states her pain is in her right flank. Faith Beck was given Flexeril and fentanyl IV. CT scan with contrast was done to look for other etiology of her pain such as a renal infarction which would not show up on a regular CT scan without contrast.  Patient's CT scan with contrast does not show any acute findings. Patient was discharged home with a anti-inflammatory and muscle relaxer, Faith Beck can use ice and heat for comfort. Faith Beck was advised to be rechecked if Faith Beck gets a fever.   Final Clinical Impressions(s) / ED Diagnoses   Final  diagnoses:  Right flank pain    New Prescriptions New Prescriptions   METHOCARBAMOL (ROBAXIN) 500 MG TABLET    Take 1 or 2 po Q 6hrs for pain   NAPROXEN (NAPROSYN) 500 MG TABLET    Take 1 po BID with food prn pain    Plan discharge  Devoria Albe, MD, Concha Pyo, MD 01/17/17 [phone]

## 2017-03-17 DIAGNOSIS — Y998 Other external cause status: Secondary | ICD-10-CM | POA: Insufficient documentation

## 2017-03-17 DIAGNOSIS — M545 Low back pain: Secondary | ICD-10-CM | POA: Insufficient documentation

## 2017-03-17 DIAGNOSIS — M5417 Radiculopathy, lumbosacral region: Secondary | ICD-10-CM | POA: Insufficient documentation

## 2017-03-17 DIAGNOSIS — M533 Sacrococcygeal disorders, not elsewhere classified: Secondary | ICD-10-CM | POA: Insufficient documentation

## 2017-03-17 DIAGNOSIS — J45909 Unspecified asthma, uncomplicated: Secondary | ICD-10-CM | POA: Insufficient documentation

## 2017-03-17 DIAGNOSIS — W101XXA Fall (on)(from) sidewalk curb, initial encounter: Secondary | ICD-10-CM | POA: Insufficient documentation

## 2017-03-17 DIAGNOSIS — Y9301 Activity, walking, marching and hiking: Secondary | ICD-10-CM | POA: Insufficient documentation

## 2017-03-17 DIAGNOSIS — Y92480 Sidewalk as the place of occurrence of the external cause: Secondary | ICD-10-CM | POA: Insufficient documentation

## 2017-03-18 ENCOUNTER — Emergency Department (HOSPITAL_COMMUNITY)
Admission: EM | Admit: 2017-03-18 | Discharge: 2017-03-18 | Disposition: A | Discharge status: Home / Self Care | Payer: Self-pay | Attending: Emergency Medicine | Admitting: Emergency Medicine

## 2017-03-18 ENCOUNTER — Emergency Department (HOSPITAL_COMMUNITY): Payer: Self-pay

## 2017-03-18 ENCOUNTER — Other Ambulatory Visit: Payer: Self-pay

## 2017-03-18 ENCOUNTER — Encounter (HOSPITAL_COMMUNITY): Payer: Self-pay | Admitting: *Deleted

## 2017-03-18 DIAGNOSIS — M5417 Radiculopathy, lumbosacral region: Secondary | ICD-10-CM

## 2017-03-18 DIAGNOSIS — M545 Low back pain, unspecified: Secondary | ICD-10-CM

## 2017-03-18 DIAGNOSIS — W19XXXA Unspecified fall, initial encounter: Secondary | ICD-10-CM

## 2017-03-18 DIAGNOSIS — M533 Sacrococcygeal disorders, not elsewhere classified: Secondary | ICD-10-CM

## 2017-03-18 MED ORDER — CYCLOBENZAPRINE HCL 5 MG PO TABS: 5.0000 mg | ORAL_TABLET | Freq: Three times a day (TID) | ORAL | 0 refills | Status: DC | PRN

## 2017-03-18 MED ORDER — DEXAMETHASONE SODIUM PHOSPHATE 10 MG/ML IJ SOLN
10.0000 mg | Freq: Once | INTRAMUSCULAR | Status: AC
  Administered 2017-03-18: 10 mg via INTRAMUSCULAR
  Filled 2017-03-18: qty 1

## 2017-03-18 MED ORDER — KETOROLAC TROMETHAMINE 60 MG/2ML IM SOLN
60.0000 mg | Freq: Once | INTRAMUSCULAR | Status: AC
  Administered 2017-03-18: 60 mg via INTRAMUSCULAR
  Filled 2017-03-18: qty 2

## 2017-03-18 MED ORDER — METHOCARBAMOL 500 MG PO TABS
500.0000 mg | ORAL_TABLET | Freq: Once | ORAL | Status: AC
  Administered 2017-03-18: 500 mg via ORAL
  Filled 2017-03-18: qty 1

## 2017-03-18 MED ORDER — NAPROXEN 500 MG PO TABS: ORAL_TABLET | ORAL | 0 refills | Status: DC

## 2017-03-18 NOTE — ED Provider Notes (Signed)
[facility] EMERGENCY DEPARTMENT Provider Note   CSN: [id number] Arrival date & time: 03/17/17  2359  Time seen 03:50 AM  History   Chief Complaint Chief Complaint  Patient presents with  . Fall    HPI Faith Beck is a 26 y.o. female.  HPI patient states about 9 AM on November 9 she was taking her son to school and she was walking down the sidewalk and did not see some red clay and slipped and fell.  She put out her right hand to keep herself from falling totally flat on her back.  However she did land in a sitting position.  She did not hit her head.  Since then she has had pain in her lower back and her tailbone that is worse with movement and walking.  She is tried heat without relief, Aleve and Tylenol also without relief.  She states she gets some pain that goes down into her left leg above her knee.  She denies any incontinence.  PCP Patient, No Pcp Per   Past Medical History:  Diagnosis Date  . Asthma   . Bronchitis   . Depression   . Depression   . Kidney stones     Patient Active Problem List   Diagnosis Date Noted  . Cholecystitis 02/19/2015  . Cholecystitis, acute 02/19/2015  . Gallbladder calculus with acute cholecystitis 02/19/2015    Past Surgical History:  Procedure Laterality Date  . CESAREAN SECTION    . DILATION AND CURETTAGE OF UTERUS    . TUBAL LIGATION      OB History    Gravida Para Term Preterm AB Living   3         2   SAB TAB Ectopic Multiple Live Births           2       Home Medications    Prior to Admission medications   Medication Sig Start Date End Date Taking? Authorizing Provider  cyclobenzaprine (FLEXERIL) 5 MG tablet Take 1 tablet (5 mg total) 3 (three) times daily as needed by mouth for muscle spasms. 03/18/17   Devoria AlbeKnapp, Monia Timmers, MD  methocarbamol (ROBAXIN) 500 MG tablet Take 1 or 2 po Q 6hrs for pain 01/17/17   Devoria AlbeKnapp, Delfin Squillace, MD  naproxen (NAPROSYN) 500 MG tablet Take 1 po BID with food prn pain 03/18/17   Devoria AlbeKnapp, Maya Scholer, MD     Family History History reviewed. No pertinent family history.  Social History Social History   Tobacco Use  . Smoking status: Never Smoker  . Smokeless tobacco: Never Used  Substance Use Topics  . Alcohol use: No  . Drug use: No  employed Drinks occassionally   Allergies   Patient has no known allergies.   Review of Systems Review of Systems  All other systems reviewed and are negative.    Physical Exam Updated Vital Signs BP 99/62 (BP Location: Right Arm)   Pulse 78   Temp 98.4 F (36.9 C) (Oral)   Resp 18   Ht 5\' 3"  (1.6 m)   Wt 106.6 kg (235 lb)   LMP 03/11/2017   SpO2 100%   BMI 41.63 kg/m   Vital signs normal except borderline blood pressure   Physical Exam  Constitutional: She appears well-developed and well-nourished.  HENT:  Head: Normocephalic and atraumatic.  Right Ear: External ear normal.  Left Ear: External ear normal.  Nose: Nose normal.  Eyes: Conjunctivae and EOM are normal.  Neck: Normal range of motion.  Cardiovascular: Normal rate.  Pulmonary/Chest: Effort normal. No stridor.  Musculoskeletal: She exhibits tenderness. She exhibits no edema or deformity.  Patient has diffuse tenderness of her lumbar spine without localization, she also is tender down in the sacral area and over the tailbone.  She states it hurts more in her lumbar spine down her tailbone.  She has some minor discomfort to palpation over the left sciatic notch, no discomfort on the right sciatic notch.  She has pain on range of motion of the lumbar spine in all directions.  There is no bruising or abrasions seen.  Nursing note and vitals reviewed.    ED Treatments / Results  Labs (all labs ordered are listed, but only abnormal results are displayed) Labs Reviewed - No data to display  EKG  EKG Interpretation None       Radiology Dg Lumbar Spine Complete  Result Date: 03/18/2017 CLINICAL DATA:  Acute onset of mid lower back pain, and left leg pain.  EXAM: LUMBAR SPINE - COMPLETE 4+ VIEW COMPARISON:  CT of the abdomen and pelvis performed 01/17/2017 FINDINGS: There is no evidence of fracture or subluxation. Vertebral bodies demonstrate normal height and alignment. Intervertebral disc spaces are preserved. The visualized neural foramina are grossly unremarkable in appearance. The visualized bowel gas pattern is unremarkable in appearance; air and stool are noted within the colon. The sacroiliac joints are within normal limits. Clips are noted within the right upper quadrant, reflecting prior cholecystectomy. IMPRESSION: No evidence of fracture or subluxation along the lumbar spine. Electronically Signed   By: Roanna RaiderJeffery  Chang M.D.   On: 03/18/2017 04:20   Dg Sacrum/coccyx  Result Date: 03/18/2017 CLINICAL DATA:  Acute onset of coccygeal pain after slipping and falling on buttocks. Pain radiates down the left leg. Initial encounter. EXAM: SACRUM AND COCCYX - 2+ VIEW COMPARISON:  CT of the abdomen and pelvis from 01/17/2017 FINDINGS: There is no definite evidence of fracture or dislocation. The sacrum and coccyx appear intact. The sacroiliac joints are grossly unremarkable in appearance. The visualized bowel gas pattern is grossly unremarkable. IMPRESSION: No evidence of fracture or dislocation. Electronically Signed   By: Roanna RaiderJeffery  Chang M.D.   On: 03/18/2017 03:01    Procedures Procedures (including critical care time)  Medications Ordered in ED Medications  dexamethasone (DECADRON) injection 10 mg (not administered)  ketorolac (TORADOL) injection 60 mg (60 mg Intramuscular Given 03/18/17 0427)  methocarbamol (ROBAXIN) tablet 500 mg (500 mg Oral Given 03/18/17 0427)     Initial Impression / Assessment and Plan / ED Course  I have reviewed the triage vital signs and the nursing notes.  Pertinent labs & imaging results that were available during my care of the patient were reviewed by me and considered in my medical decision making (see chart for  details).     Nursing staff had ordered a coccyx x-ray on her and we reviewed at which was normal.  However due to her lumbar spine pain a lumbar spine x-ray was also ordered.  She was given Toradol IM and Robaxin orally.  Patient drove herself to the ED so I could not give her anything stronger.  Recheck at 4:45 AM patient states her pain is not a lot better after the medications given.  We discussed her L lumbar spine results.  She was given Decadron to help with the radiculopathy.  We discussed using ice instead heat for her discomfort.  Final Clinical Impressions(s) / ED Diagnoses   Final diagnoses:  Fall, initial encounter  Lumbar spine pain  Pain in the coccyx  Lumbosacral radiculopathy    ED Discharge Orders        Ordered    naproxen (NAPROSYN) 500 MG tablet     03/18/17 0506    cyclobenzaprine (FLEXERIL) 5 MG tablet  3 times daily PRN     03/18/17 0506      Plan discharge  Devoria Albe, MD, Concha Pyo, MD 03/18/17 [phone]

## 2017-03-18 NOTE — ED Triage Notes (Signed)
Pt states she was walking on sidewalk this am and slipped in mud and landed on bilateral buttocks; pt is having mid back pain and pain is worse to left leg

## 2017-03-18 NOTE — Discharge Instructions (Signed)
Use ice for comfort and to reduce swelling. Take the medications as prescribed. Recheck if your pain isn't improving over the next week or if you are unable to control your urine or BM's.

## 2017-04-22 ENCOUNTER — Emergency Department (HOSPITAL_COMMUNITY): Payer: Self-pay

## 2017-04-22 ENCOUNTER — Encounter (HOSPITAL_COMMUNITY): Payer: Self-pay | Admitting: *Deleted

## 2017-04-22 ENCOUNTER — Emergency Department (HOSPITAL_COMMUNITY)
Admission: EM | Admit: 2017-04-22 | Discharge: 2017-04-22 | Disposition: A | Discharge status: Home / Self Care | Payer: Self-pay | Attending: Emergency Medicine | Admitting: Emergency Medicine

## 2017-04-22 ENCOUNTER — Other Ambulatory Visit: Payer: Self-pay

## 2017-04-22 DIAGNOSIS — Z79899 Other long term (current) drug therapy: Secondary | ICD-10-CM | POA: Insufficient documentation

## 2017-04-22 DIAGNOSIS — J9801 Acute bronchospasm: Secondary | ICD-10-CM | POA: Insufficient documentation

## 2017-04-22 DIAGNOSIS — J189 Pneumonia, unspecified organism: Secondary | ICD-10-CM | POA: Insufficient documentation

## 2017-04-22 MED ORDER — AMOXICILLIN 500 MG PO CAPS: 500.0000 mg | ORAL_CAPSULE | Freq: Three times a day (TID) | ORAL | 0 refills | Status: DC

## 2017-04-22 MED ORDER — AEROCHAMBER Z-STAT PLUS/MEDIUM MISC: 1.0000 | Freq: Once | Status: DC

## 2017-04-22 MED ORDER — ALBUTEROL SULFATE (2.5 MG/3ML) 0.083% IN NEBU
2.5000 mg | INHALATION_SOLUTION | Freq: Once | RESPIRATORY_TRACT | Status: AC
  Administered 2017-04-22: 2.5 mg via RESPIRATORY_TRACT
  Filled 2017-04-22: qty 3

## 2017-04-22 MED ORDER — SODIUM CHLORIDE 0.9 % IV BOLUS (SEPSIS)
1000.0000 mL | Freq: Once | INTRAVENOUS | Status: AC
  Administered 2017-04-22: 1000 mL via INTRAVENOUS

## 2017-04-22 MED ORDER — ALBUTEROL SULFATE HFA 108 (90 BASE) MCG/ACT IN AERS
2.0000 | INHALATION_SPRAY | Freq: Four times a day (QID) | RESPIRATORY_TRACT | Status: DC | PRN
  Administered 2017-04-22: 2 via RESPIRATORY_TRACT
  Filled 2017-04-22: qty 6.7

## 2017-04-22 MED ORDER — AZITHROMYCIN 250 MG PO TABS: ORAL_TABLET | ORAL | 0 refills | Status: DC

## 2017-04-22 MED ORDER — IPRATROPIUM-ALBUTEROL 0.5-2.5 (3) MG/3ML IN SOLN
3.0000 mL | Freq: Once | RESPIRATORY_TRACT | Status: AC
  Administered 2017-04-22: 3 mL via RESPIRATORY_TRACT
  Filled 2017-04-22: qty 3

## 2017-04-22 MED ORDER — METHYLPREDNISOLONE SODIUM SUCC 125 MG IJ SOLR
125.0000 mg | Freq: Once | INTRAMUSCULAR | Status: AC
  Administered 2017-04-22: 125 mg via INTRAVENOUS
  Filled 2017-04-22: qty 2

## 2017-04-22 MED ORDER — ACETAMINOPHEN 325 MG PO TABS
650.0000 mg | ORAL_TABLET | Freq: Once | ORAL | Status: AC
  Administered 2017-04-22: 650 mg via ORAL
  Filled 2017-04-22: qty 2

## 2017-04-22 MED ORDER — PREDNISONE 20 MG PO TABS: ORAL_TABLET | ORAL | 0 refills | Status: DC

## 2017-04-22 MED ORDER — ONDANSETRON HCL 4 MG/2ML IJ SOLN
4.0000 mg | Freq: Once | INTRAMUSCULAR | Status: AC
  Administered 2017-04-22: 4 mg via INTRAVENOUS
  Filled 2017-04-22: qty 2

## 2017-04-22 MED ORDER — IPRATROPIUM BROMIDE 0.02 % IN SOLN
0.5000 mg | Freq: Once | RESPIRATORY_TRACT | Status: AC
  Administered 2017-04-22: 0.5 mg via RESPIRATORY_TRACT
  Filled 2017-04-22: qty 2.5

## 2017-04-22 MED ORDER — DEXTROSE 5 % IV SOLN
1.0000 g | Freq: Once | INTRAVENOUS | Status: AC
  Administered 2017-04-22: 1 g via INTRAVENOUS
  Filled 2017-04-22: qty 10

## 2017-04-22 MED ORDER — DEXTROSE 5 % IV SOLN
500.0000 mg | Freq: Once | INTRAVENOUS | Status: AC
  Administered 2017-04-22: 500 mg via INTRAVENOUS
  Filled 2017-04-22: qty 500

## 2017-04-22 MED ORDER — BENZONATATE 100 MG PO CAPS
200.0000 mg | ORAL_CAPSULE | Freq: Once | ORAL | Status: AC
  Administered 2017-04-22: 200 mg via ORAL
  Filled 2017-04-22: qty 2

## 2017-04-22 NOTE — ED Triage Notes (Signed)
Pt c/o sob with sore throat and cough x 2 days

## 2017-04-22 NOTE — ED Notes (Signed)
Pt ambulated in hallway with no assistance, steady gait; O2 97%-100% during ambulation, pt reports no SOB and "feels much than when I came in here"

## 2017-04-22 NOTE — ED Notes (Signed)
Pt states "I feel a little bit dizzy and nauseous"

## 2017-04-22 NOTE — Discharge Instructions (Signed)
Drink plenty of fluids.  Take Tylenol as needed for fever.  Use the inhaler 2 puffs every 6 hours as needed for wheezing and shortness of breath.  Start the antibiotics tomorrow, Sunday, December 16, the IV antibiotics you got today will last 24 hours.  Take the prednisone as instructed until gone.  Return to the emergency department if you struggled to breathe or seem to be getting worse.

## 2017-04-22 NOTE — ED Provider Notes (Signed)
[facility] EMERGENCY DEPARTMENT Provider Note   CSN: [id number] Arrival date & time: 04/22/17  0031     History   Chief Complaint Chief Complaint  Patient presents with  . Shortness of Breath    HPI Faith Beck is a 26 y.o. female with a history of asthma, acute bronchitis and reports household members with recent cough and fever symptoms presenting with a 2 day history of cough, shortness of breath, intermittent wheezing and sore throat along with nasal congestion with clear rhinorrhea.  Additionally she reports post tussive emesis today, stating she has been unable to keep "anything down".  She denies abdominal pain, diarrhea or dysuria.  She has used her albuterol mdi, most recently 45 minutes prior to arrival with small improvement in sob.  She denies headache, neck pain or stiffness.  She does endorse mid sternal chest pain triggered by cough only and quiescent at rest. She has found no other alleviators.  The history is provided by the patient.    Past Medical History:  Diagnosis Date  . Asthma   . Bronchitis   . Depression   . Depression   . Kidney stones     Patient Active Problem List   Diagnosis Date Noted  . Cholecystitis 02/19/2015  . Cholecystitis, acute 02/19/2015  . Gallbladder calculus with acute cholecystitis 02/19/2015    Past Surgical History:  Procedure Laterality Date  . CESAREAN SECTION    . CHOLECYSTECTOMY N/A 02/19/2015   Procedure: LAPAROSCOPIC CHOLECYSTECTOMY WITH INTRAOPERATIVE CHOLANGIOGRAM;  Surgeon: Darnell Levelodd Gerkin, MD;  Location: [facility];  Service: General;  Laterality: N/A;  . DILATION AND CURETTAGE OF UTERUS    . TUBAL LIGATION      OB History    Gravida Para Term Preterm AB Living   3         2   SAB TAB Ectopic Multiple Live Births           2       Home Medications    Prior to Admission medications   Medication Sig Start Date End Date Taking? Authorizing Provider  cyclobenzaprine (FLEXERIL) 5 MG tablet Take 1 tablet  (5 mg total) 3 (three) times daily as needed by mouth for muscle spasms. 03/18/17   Devoria AlbeKnapp, Iva, MD  methocarbamol (ROBAXIN) 500 MG tablet Take 1 or 2 po Q 6hrs for pain 01/17/17   Devoria AlbeKnapp, Iva, MD  naproxen (NAPROSYN) 500 MG tablet Take 1 po BID with food prn pain 03/18/17   Devoria AlbeKnapp, Iva, MD    Family History History reviewed. No pertinent family history.  Social History Social History   Tobacco Use  . Smoking status: Never Smoker  . Smokeless tobacco: Never Used  Substance Use Topics  . Alcohol use: No  . Drug use: No     Allergies   Patient has no known allergies.   Review of Systems Review of Systems  Constitutional: Positive for fever.  HENT: Positive for congestion, rhinorrhea and sore throat. Negative for ear pain, sinus pressure, trouble swallowing and voice change.   Eyes: Negative for discharge.  Respiratory: Positive for cough, chest tightness, shortness of breath and wheezing. Negative for stridor.   Cardiovascular: Negative for chest pain and palpitations.  Gastrointestinal: Negative for abdominal pain.  Genitourinary: Negative.      Physical Exam Updated Vital Signs BP 121/72 (BP Location: Left Arm)   Pulse (!) 111   Temp 99.1 F (37.3 C) (Oral)   Resp 20   Ht 5\' 3"  (1.6  m)   Wt 106.6 kg (235 lb)   LMP 04/09/2017   SpO2 98%   BMI 41.63 kg/m   Physical Exam  Constitutional: She is oriented to person, place, and time. She appears well-developed and well-nourished.  HENT:  Head: Normocephalic and atraumatic.  Right Ear: Tympanic membrane and ear canal normal.  Left Ear: Tympanic membrane and ear canal normal.  Nose: Mucosal edema and rhinorrhea present.  Mouth/Throat: Uvula is midline and mucous membranes are normal. Posterior oropharyngeal erythema present. No oropharyngeal exudate, posterior oropharyngeal edema or tonsillar abscesses.  Mild posterior pharyngeal erythema, no exudate.  Eyes: Conjunctivae are normal.  Neck: Normal range of motion.    Cardiovascular: Normal rate and normal heart sounds.  Pulmonary/Chest: Effort normal. No respiratory distress. She has decreased breath sounds in the right lower field and the left lower field. She has no wheezes. She has no rhonchi. She has no rales.  Decreased breath sounds bases, prolonged expirations without wheezing.  Abdominal: Soft. There is no tenderness.  Musculoskeletal: Normal range of motion.  Neurological: She is alert and oriented to person, place, and time.  Skin: Skin is warm and dry. No rash noted.  Psychiatric: She has a normal mood and affect.     ED Treatments / Results  Labs (all labs ordered are listed, but only abnormal results are displayed) Labs Reviewed  POC URINE PREG, ED    EKG  EKG Interpretation None       Radiology No results found.  Procedures Procedures (including critical care time)  Medications Ordered in ED Medications  acetaminophen (TYLENOL) tablet 650 mg (not administered)  benzonatate (TESSALON) capsule 200 mg (not administered)  albuterol (PROVENTIL) (2.5 MG/3ML) 0.083% nebulizer solution 2.5 mg (not administered)  ipratropium-albuterol (DUONEB) 0.5-2.5 (3) MG/3ML nebulizer solution 3 mL (not administered)     Initial Impression / Assessment and Plan / ED Course  I have reviewed the triage vital signs and the nursing notes.  Pertinent labs & imaging results that were available during my care of the patient were reviewed by me and considered in my medical decision making (see chart for details).     Pt with cough, congestion, sore throat and wheezing with sob, no wheezing currently in ed but with reduced breath sounds.  Neb tx, tessalon ordered. Cxr.  Pt discussed with Dr. Lynelle DoctorKnapp who will follow and dispo pt.  Final Clinical Impressions(s) / ED Diagnoses   Final diagnoses:  None    ED Discharge Orders    None       Victoriano Laindol, Mystery Schrupp, PA-C 04/22/17 0116    Devoria AlbeKnapp, Iva, MD 04/22/17 [phone]

## 2017-06-26 ENCOUNTER — Other Ambulatory Visit: Payer: Self-pay

## 2017-06-26 ENCOUNTER — Emergency Department (HOSPITAL_COMMUNITY): Payer: BLUE CROSS/BLUE SHIELD

## 2017-06-26 ENCOUNTER — Emergency Department (HOSPITAL_COMMUNITY)
Admission: EM | Admit: 2017-06-26 | Discharge: 2017-06-27 | Disposition: A | Discharge status: Home / Self Care | Payer: BLUE CROSS/BLUE SHIELD | Attending: Emergency Medicine | Admitting: Emergency Medicine

## 2017-06-26 ENCOUNTER — Encounter (HOSPITAL_COMMUNITY): Payer: Self-pay | Admitting: Emergency Medicine

## 2017-06-26 DIAGNOSIS — J45909 Unspecified asthma, uncomplicated: Secondary | ICD-10-CM | POA: Diagnosis not present

## 2017-06-26 DIAGNOSIS — S9031XA Contusion of right foot, initial encounter: Secondary | ICD-10-CM | POA: Diagnosis not present

## 2017-06-26 DIAGNOSIS — Y999 Unspecified external cause status: Secondary | ICD-10-CM | POA: Insufficient documentation

## 2017-06-26 DIAGNOSIS — Y929 Unspecified place or not applicable: Secondary | ICD-10-CM | POA: Diagnosis not present

## 2017-06-26 DIAGNOSIS — Y939 Activity, unspecified: Secondary | ICD-10-CM | POA: Insufficient documentation

## 2017-06-26 DIAGNOSIS — W2209XA Striking against other stationary object, initial encounter: Secondary | ICD-10-CM | POA: Insufficient documentation

## 2017-06-26 DIAGNOSIS — Z79899 Other long term (current) drug therapy: Secondary | ICD-10-CM | POA: Diagnosis not present

## 2017-06-26 DIAGNOSIS — S99921A Unspecified injury of right foot, initial encounter: Secondary | ICD-10-CM | POA: Diagnosis present

## 2017-06-26 DIAGNOSIS — M79671 Pain in right foot: Secondary | ICD-10-CM | POA: Diagnosis not present

## 2017-06-26 MED ORDER — TRAMADOL HCL 50 MG PO TABS
50.0000 mg | ORAL_TABLET | Freq: Once | ORAL | Status: AC
  Administered 2017-06-27: 50 mg via ORAL
  Filled 2017-06-26: qty 1

## 2017-06-26 MED ORDER — DICLOFENAC SODIUM 75 MG PO TBEC: 75.0000 mg | DELAYED_RELEASE_TABLET | Freq: Two times a day (BID) | ORAL | 0 refills | Status: DC

## 2017-06-26 NOTE — ED Triage Notes (Signed)
Pt c/o right foot pain x 3 days with no injury.

## 2017-06-26 NOTE — Discharge Instructions (Signed)
Wear the post op shoe as needed.  Call the foot doctor listed to arrange a follow-up appt in one week if not improving

## 2017-06-26 NOTE — ED Provider Notes (Signed)
[facility] EMERGENCY DEPARTMENT Provider Note   CSN: [id number] Arrival date & time: 06/26/17  2203     History   Chief Complaint Chief Complaint  Patient presents with  . Foot Pain    HPI Faith Beck is a 27 y.o. female.  HPI   Faith Beck is a 27 y.o. female who presents to the Emergency Department complaining of right foot pain for 3 days.  She states that she struck her foot on something 3-4 days ago and has developed pain to the top of her foot that radiates to her great toe.  The pain is worse with movement of the great toe.  She is applied ice and taken ibuprofen without relief.  She states wearing her shoes and walking also makes the pain worse.  She denies open wound, redness, numbness and pain extending to her ankle or calf.    Past Medical History:  Diagnosis Date  . Asthma   . Bronchitis   . Depression   . Depression   . Kidney stones     Patient Active Problem List   Diagnosis Date Noted  . Cholecystitis 02/19/2015  . Cholecystitis, acute 02/19/2015  . Gallbladder calculus with acute cholecystitis 02/19/2015    Past Surgical History:  Procedure Laterality Date  . CESAREAN SECTION    . CHOLECYSTECTOMY N/A 02/19/2015   Procedure: LAPAROSCOPIC CHOLECYSTECTOMY WITH INTRAOPERATIVE CHOLANGIOGRAM;  Surgeon: Darnell Level, MD;  Location: [facility];  Service: General;  Laterality: N/A;  . DILATION AND CURETTAGE OF UTERUS    . TUBAL LIGATION      OB History    Gravida Para Term Preterm AB Living   3         2   SAB TAB Ectopic Multiple Live Births           2       Home Medications    Prior to Admission medications   Medication Sig Start Date End Date Taking? Authorizing Provider  amoxicillin (AMOXIL) 500 MG capsule Take 1 capsule (500 mg total) by mouth 3 (three) times daily. 04/22/17   Devoria Albe, MD  azithromycin (ZITHROMAX) 250 MG tablet Take 1 po QD starting on Sunday Dec 16 04/22/17   Devoria Albe, MD  cyclobenzaprine (FLEXERIL) 5 MG  tablet Take 1 tablet (5 mg total) 3 (three) times daily as needed by mouth for muscle spasms. 03/18/17   Devoria Albe, MD  methocarbamol (ROBAXIN) 500 MG tablet Take 1 or 2 po Q 6hrs for pain 01/17/17   Devoria Albe, MD  naproxen (NAPROSYN) 500 MG tablet Take 1 po BID with food prn pain 03/18/17   Devoria Albe, MD  predniSONE (DELTASONE) 20 MG tablet Take 3 po QD x 3d , then 2 po QD x 3d then 1 po QD x 3d 04/22/17   Devoria Albe, MD    Family History History reviewed. No pertinent family history.  Social History Social History   Tobacco Use  . Smoking status: Never Smoker  . Smokeless tobacco: Never Used  Substance Use Topics  . Alcohol use: No  . Drug use: No     Allergies   Patient has no known allergies.   Review of Systems Review of Systems  Constitutional: Negative for chills and fever.  Musculoskeletal: Positive for arthralgias (Right foot pain). Negative for joint swelling.  Skin: Negative for color change and wound.  Neurological: Negative for weakness and numbness.  All other systems reviewed and are negative.    Physical  Exam Updated Vital Signs BP 114/87 (BP Location: Right Arm)   Pulse 67   Temp (!) 97.4 F (36.3 C) (Oral)   Resp 16   Ht 5\' 3"  (1.6 m)   Wt 106.6 kg (235 lb)   LMP 06/20/2017   SpO2 100%   BMI 41.63 kg/m   Physical Exam  Constitutional: She is oriented to person, place, and time. She appears well-developed and well-nourished. No distress.  HENT:  Head: Atraumatic.  Mouth/Throat: Oropharynx is clear and moist.  Cardiovascular: Normal rate, regular rhythm and intact distal pulses.  No murmur heard. Bilateral dorsalis pedis and posterior tibial pulses are palpable and brisk  Pulmonary/Chest: Effort normal and breath sounds normal. No respiratory distress.  Musculoskeletal: She exhibits tenderness. She exhibits no edema.  Focal tenderness dorsal right foot along the extensor tendon of the right great toe w/o edema or erythema.    Neurological:  She is alert and oriented to person, place, and time. No sensory deficit.  Skin: Skin is warm. Capillary refill takes less than 2 seconds. No rash noted. No erythema.  Skin changes of the right foot  Psychiatric: She has a normal mood and affect.  Nursing note and vitals reviewed.    ED Treatments / Results  Labs (all labs ordered are listed, but only abnormal results are displayed) Labs Reviewed - No data to display  EKG  EKG Interpretation None       Radiology Dg Foot Complete Right  Result Date: 06/26/2017 CLINICAL DATA:  Right foot pain EXAM: RIGHT FOOT COMPLETE - 3+ VIEW COMPARISON:  None. FINDINGS: There is no evidence of fracture or dislocation. There is no evidence of arthropathy or other focal bone abnormality. Soft tissues are unremarkable. IMPRESSION: No fracture or dislocation of the right foot. Electronically Signed   By: Deatra RobinsonKevin  Herman M.D.   On: 06/26/2017 22:44    Procedures Procedures (including critical care time)  Medications Ordered in ED Medications  traMADol (ULTRAM) tablet 50 mg (not administered)     Initial Impression / Assessment and Plan / ED Course  I have reviewed the triage vital signs and the nursing notes.  Pertinent labs & imaging results that were available during my care of the patient were reviewed by me and considered in my medical decision making (see chart for details).     Symptoms of the right foot likely secondary to contusion.  No concerning symptoms for cellulitis.  Neurovascularly intact.  Postop shoe applied for comfort.  Patient agrees to treatment plan.  Orthopedic referral provided.  She appears safe for discharge home  Final Clinical Impressions(s) / ED Diagnoses   Final diagnoses:  Contusion of right foot, initial encounter    ED Discharge Orders    None       Pauline Ausriplett, Abby Tucholski, PA-C 06/26/17 2357    Gilda CreasePollina, Christopher J, MD 06/28/17 [phone]

## 2017-10-06 ENCOUNTER — Telehealth: Payer: Self-pay | Admitting: Family Medicine

## 2017-10-06 ENCOUNTER — Ambulatory Visit (INDEPENDENT_AMBULATORY_CARE_PROVIDER_SITE_OTHER): Payer: BLUE CROSS/BLUE SHIELD | Admitting: Family Medicine

## 2017-10-06 ENCOUNTER — Encounter: Payer: Self-pay | Admitting: Family Medicine

## 2017-10-06 ENCOUNTER — Other Ambulatory Visit: Payer: Self-pay

## 2017-10-06 VITALS — BP 106/75 | HR 78 | Temp 98.3°F | Resp 16 | Ht 64.0 in | Wt 232.0 lb

## 2017-10-06 DIAGNOSIS — K219 Gastro-esophageal reflux disease without esophagitis: Secondary | ICD-10-CM | POA: Diagnosis not present

## 2017-10-06 DIAGNOSIS — N920 Excessive and frequent menstruation with regular cycle: Secondary | ICD-10-CM

## 2017-10-06 DIAGNOSIS — R5383 Other fatigue: Secondary | ICD-10-CM

## 2017-10-06 DIAGNOSIS — E039 Hypothyroidism, unspecified: Secondary | ICD-10-CM | POA: Diagnosis not present

## 2017-10-06 DIAGNOSIS — K449 Diaphragmatic hernia without obstruction or gangrene: Secondary | ICD-10-CM | POA: Diagnosis not present

## 2017-10-06 DIAGNOSIS — Z9181 History of falling: Secondary | ICD-10-CM | POA: Diagnosis not present

## 2017-10-06 MED ORDER — RANITIDINE HCL 150 MG PO TABS: 150.0000 mg | ORAL_TABLET | Freq: Two times a day (BID) | ORAL | 5 refills | Status: DC

## 2017-10-06 NOTE — Telephone Encounter (Unsigned)
Copied from CRM [id number]. Topic: Quick Communication - Patient Running Late >> Oct 06, 2017  1:55 PM Raquel Sarna wrote: Patient called and is running 10 minutes late.  Pt left debt card at home and needed gas to get to the visit.  Pt wanted to try to make the appt.

## 2017-10-06 NOTE — Progress Notes (Signed)
5/31/20193:22 PM  Faith Beck Jun 18, 1990, 27 y.o. female [id number]  Chief Complaint  Patient presents with  . Fatigue    x2 months - feels cold frequently - prev dx hypothyroidism   . Depression    11 on phq 9  . Fall    multiple falls with bruising to back - last fall 2 weeks ago    HPI:   Patient is a 27 y.o. female with past medical history significant for hypothyroidism who presents today wondering if she needs to get back on medication  States diagnosed with hypothyroidism during her 2nd pregnancy Used to be on synthroid, ran out of meds for a while, when her thyroid was rechecked it was normal and told she did not medication anymore She reports for past several months feeling fatigued, brain processing slowly, always cold feet and hand, falling hair, heavy painful menses, clumsy She does not think this is depression, she has had severe depression in the past, needing to be hospitalized, and this does not feel like that, it feels like when she was first diagnosed with thyroid issues   Fall Risk  10/06/2017  Falls in the past year? Yes  Number falls in past yr: 2 or more  Injury with Fall? Yes  Comment back bruising     Depression screen PHQ 2/9 10/06/2017  Decreased Interest 1  Down, Depressed, Hopeless 2  PHQ - 2 Score 3  Altered sleeping 2  Tired, decreased energy 2  Change in appetite 1  Feeling bad or failure about yourself  1  Trouble concentrating 1  Moving slowly or fidgety/restless 1  Suicidal thoughts 0  PHQ-9 Score 11  Difficult doing work/chores Not difficult at all    No Known Allergies  Prior to Admission medications   Medication Sig Start Date End Date Taking? Authorizing Provider  loratadine (CLARITIN) 10 MG tablet Take 10 mg by mouth daily.   Yes [provider]    Past Medical History:  Diagnosis Date  . Asthma   . Bronchitis   . Depression   . Depression   . Hypothyroid   . Kidney stones     Past Surgical  History:  Procedure Laterality Date  . CESAREAN SECTION    . CHOLECYSTECTOMY N/A 02/19/2015   Procedure: LAPAROSCOPIC CHOLECYSTECTOMY WITH INTRAOPERATIVE CHOLANGIOGRAM;  Surgeon: Darnell Levelodd Gerkin, MD;  Location: [facility];  Service: General;  Laterality: N/A;  . DILATION AND CURETTAGE OF UTERUS    . TUBAL LIGATION      Social History   Tobacco Use  . Smoking status: Never Smoker  . Smokeless tobacco: Never Used  Substance Use Topics  . Alcohol use: No    History reviewed. No pertinent family history.  Review of Systems  Constitutional: Positive for chills, diaphoresis (night sweats) and malaise/fatigue. Negative for fever.  HENT: Positive for congestion and sore throat. Negative for ear pain.   Respiratory: Negative for cough and shortness of breath.   Cardiovascular: Negative for chest pain, palpitations and leg swelling.  Gastrointestinal: Positive for heartburn (reports hiatal hernia, nexium not helping, does better on zantac). Negative for abdominal pain, constipation, diarrhea, nausea and vomiting.  Genitourinary: Negative for dysuria and hematuria.  Musculoskeletal: Positive for falls. Negative for myalgias.  Neurological: Negative for dizziness, weakness and headaches.  Psychiatric/Behavioral: Negative for depression and suicidal ideas. The patient is nervous/anxious.    Per hpi  OBJECTIVE:  Blood pressure 106/75, pulse 78, temperature 98.3 F (36.8 C), temperature source Oral, resp. rate 16,  height 5\' 4"  (1.626 m), weight 232 lb (105.2 kg), SpO2 97 %, unknown if currently breastfeeding.  Physical Exam  Constitutional: She is oriented to person, place, and time. She appears well-developed and well-nourished.  HENT:  Head: Normocephalic and atraumatic.  Right Ear: Hearing, tympanic membrane, external ear and ear canal normal.  Left Ear: Hearing, tympanic membrane, external ear and ear canal normal.  Mouth/Throat: Oropharynx is clear and moist.  cobblestoning and slight  erythema noted in OP  Eyes: Pupils are equal, round, and reactive to light. Conjunctivae and EOM are normal.  Neck: Neck supple. No thyromegaly present.  Cardiovascular: Normal rate, regular rhythm and normal heart sounds. Exam reveals no gallop and no friction rub.  No murmur heard. Pulmonary/Chest: Effort normal and breath sounds normal. She has no wheezes. She has no rales.  Musculoskeletal: She exhibits no edema.  Lymphadenopathy:    She has no cervical adenopathy.  Neurological: She is alert and oriented to person, place, and time.  Skin: Skin is warm and dry.  Psychiatric: She has a normal mood and affect.  Nursing note and vitals reviewed.     ASSESSMENT and PLAN  1. Hypothyroidism, unspecified type Checking levels today, further mgt pending results - TSH  2. Menorrhagia with regular cycle - CBC with Differential  3. Fatigue, unspecified type - Comprehensive metabolic panel  4. Gastroesophageal reflux disease, esophagitis presence not specified  5. Hiatal hernia  6. Risk for falls  Other orders - loratadine (CLARITIN) 10 MG tablet; Take 10 mg by mouth daily. - ranitidine (ZANTAC) 150 MG tablet; Take 1 tablet (150 mg total) by mouth 2 (two) times daily.  Return in about 3 months (around 01/06/2018).    Myles Lipps, MD Primary Care at [facility] [address] [place], [place] [address] Ph.  [phone] Fax [phone]

## 2017-10-06 NOTE — Patient Instructions (Signed)
     IF you received an x-ray today, you will receive an invoice from [facility]. Please contact [facility] at [phone] with questions or concerns regarding your invoice.   IF you received labwork today, you will receive an invoice from [facility]. Please contact [facility] at [phone] with questions or concerns regarding your invoice.   Our billing staff will not be able to assist you with questions regarding bills from these companies.  You will be contacted with the lab results as soon as they are available. The fastest way to get your results is to activate your My Chart account. Instructions are located on the last page of this paperwork. If you have not heard from us regarding the results in 2 weeks, please contact this office.     

## 2017-10-07 ENCOUNTER — Encounter: Payer: Self-pay | Admitting: Family Medicine

## 2017-10-07 LAB — COMPREHENSIVE METABOLIC PANEL
ALT: 10 IU/L (ref 0–32)
AST: 17 IU/L (ref 0–40)
Albumin/Globulin Ratio: 1.8 (ref 1.2–2.2)
Albumin: 4.1 g/dL (ref 3.5–5.5)
Alkaline Phosphatase: 72 IU/L (ref 39–117)
BUN/Creatinine Ratio: 14 (ref 9–23)
BUN: 11 mg/dL (ref 6–20)
Bilirubin Total: 0.3 mg/dL (ref 0.0–1.2)
CO2: 22 mmol/L (ref 20–29)
Calcium: 9.5 mg/dL (ref 8.7–10.2)
Chloride: 106 mmol/L (ref 96–106)
Creatinine, Ser: 0.77 mg/dL (ref 0.57–1.00)
GFR calc Af Amer: 122 mL/min/{1.73_m2} (ref >59)
GFR calc non Af Amer: 106 mL/min/{1.73_m2} (ref >59)
Globulin, Total: 2.3 g/dL (ref 1.5–4.5)
Glucose: 94 mg/dL (ref 65–99)
Potassium: 3.8 mmol/L (ref 3.5–5.2)
Sodium: 140 mmol/L (ref 134–144)
Total Protein: 6.4 g/dL (ref 6.0–8.5)

## 2017-10-07 LAB — CBC WITH DIFFERENTIAL/PLATELET
Basophils Absolute: 0 10*3/uL (ref 0.0–0.2)
Basos: 1 %
EOS (ABSOLUTE): 0.7 10*3/uL — ABNORMAL HIGH (ref 0.0–0.4)
Eos: 9 %
Hematocrit: 40.7 % (ref 34.0–46.6)
Hemoglobin: 13.1 g/dL (ref 11.1–15.9)
Immature Grans (Abs): 0 10*3/uL (ref 0.0–0.1)
Immature Granulocytes: 0 %
Lymphocytes Absolute: 2.2 10*3/uL (ref 0.7–3.1)
Lymphs: 27 %
MCH: 28 pg (ref 26.6–33.0)
MCHC: 32.2 g/dL (ref 31.5–35.7)
MCV: 87 fL (ref 79–97)
Monocytes Absolute: 0.6 10*3/uL (ref 0.1–0.9)
Monocytes: 7 %
Neutrophils Absolute: 4.4 10*3/uL (ref 1.4–7.0)
Neutrophils: 56 %
Platelets: 259 10*3/uL (ref 150–450)
RBC: 4.68 x10E6/uL (ref 3.77–5.28)
RDW: 15.2 % (ref 12.3–15.4)
WBC: 7.9 10*3/uL (ref 3.4–10.8)

## 2017-10-07 LAB — TSH: TSH: 3.33 u[IU]/mL (ref 0.450–4.500)

## 2017-10-17 ENCOUNTER — Encounter: Payer: Self-pay | Admitting: Family Medicine

## 2017-10-17 ENCOUNTER — Ambulatory Visit (INDEPENDENT_AMBULATORY_CARE_PROVIDER_SITE_OTHER): Payer: BLUE CROSS/BLUE SHIELD | Admitting: Family Medicine

## 2017-10-17 VITALS — BP 110/70 | HR 90 | Temp 98.4°F | Resp 17 | Ht 64.0 in | Wt 234.0 lb

## 2017-10-17 DIAGNOSIS — R21 Rash and other nonspecific skin eruption: Secondary | ICD-10-CM

## 2017-10-17 DIAGNOSIS — J4521 Mild intermittent asthma with (acute) exacerbation: Secondary | ICD-10-CM | POA: Diagnosis not present

## 2017-10-17 DIAGNOSIS — J069 Acute upper respiratory infection, unspecified: Secondary | ICD-10-CM | POA: Diagnosis not present

## 2017-10-17 MED ORDER — TRIAMCINOLONE ACETONIDE 0.1 % EX CREA: 1.0000 "application " | TOPICAL_CREAM | Freq: Two times a day (BID) | CUTANEOUS | 0 refills | Status: DC

## 2017-10-17 MED ORDER — ALBUTEROL SULFATE HFA 108 (90 BASE) MCG/ACT IN AERS
2.0000 | INHALATION_SPRAY | Freq: Four times a day (QID) | RESPIRATORY_TRACT | 3 refills | Status: DC | PRN
Start: 2017-10-17 — End: 2018-11-21

## 2017-10-17 MED ORDER — PREDNISONE 20 MG PO TABS: 40.0000 mg | ORAL_TABLET | Freq: Every day | ORAL | 0 refills | Status: DC

## 2017-10-17 NOTE — Patient Instructions (Addendum)
   IF you received an x-ray today, you will receive an invoice from [facility]. Please contact [facility] at [phone] with questions or concerns regarding your invoice.   IF you received labwork today, you will receive an invoice from [facility]. Please contact [facility] at [phone] with questions or concerns regarding your invoice.   Our billing staff will not be able to assist you with questions regarding bills from these companies.  You will be contacted with the lab results as soon as they are available. The fastest way to get your results is to activate your My Chart account. Instructions are located on the last page of this paperwork. If you have not heard from us regarding the results in 2 weeks, please contact this office.      Asthma, Acute Bronchospasm Acute bronchospasm caused by asthma is also referred to as an asthma attack. Bronchospasm means your air passages become narrowed. The narrowing is caused by inflammation and tightening of the muscles in the air tubes (bronchi) in your lungs. This can make it hard to breathe or cause you to wheeze and cough. What are the causes? Possible triggers are:  Animal dander from the skin, hair, or feathers of animals.  Dust mites contained in house dust.  Cockroaches.  Pollen from trees or grass.  Mold.  Cigarette or tobacco smoke.  Air pollutants such as dust, household cleaners, hair sprays, aerosol sprays, paint fumes, strong chemicals, or strong odors.  Cold air or weather changes. Cold air may trigger inflammation. Winds increase molds and pollens in the air.  Strong emotions such as crying or laughing hard.  Stress.  Certain medicines such as aspirin or beta-blockers.  Sulfites in foods and drinks, such as dried fruits and wine.  Infections or inflammatory conditions, such as a flu, cold, or inflammation of the nasal membranes (rhinitis).  Gastroesophageal reflux disease (GERD). GERD is a  condition where stomach acid backs up into your esophagus.  Exercise or strenuous activity. What are the signs or symptoms?  Wheezing.  Excessive coughing, particularly at night.  Chest tightness.  Shortness of breath. How is this diagnosed? Your health care provider will ask you about your medical history and perform a physical exam. A chest X-ray or blood testing may be performed to look for other causes of your symptoms or other conditions that may have triggered your asthma attack. How is this treated? Treatment is aimed at reducing inflammation and opening up the airways in your lungs. Most asthma attacks are treated with inhaled medicines. These include quick relief or rescue medicines (such as bronchodilators) and controller medicines (such as inhaled corticosteroids). These medicines are sometimes given through an inhaler or a nebulizer. Systemic steroid medicine taken by mouth or given through an IV tube also can be used to reduce the inflammation when an attack is moderate or severe. Antibiotic medicines are only used if a bacterial infection is present. Follow these instructions at home:  Rest.  Drink plenty of liquids. This helps the mucus to remain thin and be easily coughed up. Only use caffeine in moderation and do not use alcohol until you have recovered from your illness.  Do not smoke. Avoid being exposed to secondhand smoke.  You play a critical role in keeping yourself in good health. Avoid exposure to things that cause you to wheeze or to have breathing problems.  Keep your medicines up-to-date and available. Carefully follow your health care provider's treatment plan.  Take your medicine exactly as prescribed.    When pollen or pollution is bad, keep windows closed and use an air conditioner or go to places with air conditioning.  Asthma requires careful medical care. See your health care provider for a follow-up as advised. If you are more than [redacted] weeks pregnant  and you were prescribed any new medicines, let your obstetrician know about the visit and how you are doing. Follow up with your health care provider as directed.  After you have recovered from your asthma attack, make an appointment with your outpatient doctor to talk about ways to reduce the likelihood of future attacks. If you do not have a doctor who manages your asthma, make an appointment with a primary care doctor to discuss your asthma. Get help right away if:  You are getting worse.  You have trouble breathing. If severe, call your local emergency services (911 in the [place]  You develop chest pain or discomfort.  You are vomiting.  You are not able to keep fluids down.  You are coughing up yellow, green, brown, or bloody sputum.  You have a fever and your symptoms suddenly get worse.  You have trouble swallowing. This information is not intended to replace advice given to you by your health care provider. Make sure you discuss any questions you have with your health care provider. Document Released: 08/10/2006 Document Revised: 10/07/2015 Document Reviewed: 10/31/2012 Elsevier Interactive Patient Education  2017 [employer]  

## 2017-10-17 NOTE — Progress Notes (Signed)
6/11/20192:49 PM  Faith CassetteBrittney N Beck August 06, 1990, 27 y.o. female [id number]  Chief Complaint  Patient presents with  . Thyroid Problem    follow up  . URI    st started on saturday, cough started on sun evening early monday 6/10 and chest feels heavy and sob, taking inhaler not helping only causing pt to be jittery.  Pt has hx of bronchitis and per pt it feels like that.  Pt took robitussin and dayquil with no relief  . Rash    arms, legs and back since 10/13/17, tried cortisone and benadryl with no relief.    HPI:   Patient is a [age] y.o. female with past medical history significant for asthma who presents today for 3 days of sore throat, cough with intermittent sputum production, chest tightness, SOB and wheezing.  Patient denies any ear pain, sinus pressure, fever or chills Has been doing OTC cough meds and albuterol inhaler wo much relief She reports asthma since childhood, denies ever needing maintenance inhaler She reports main trigger is if she gets a cold She has never been hospitalized She does not smoke  She also has a rash along left elbow, itchy, OTC hydrocortisone not helping [facility] Denies any new exposures to soaps, detergents, lotions, etc   TSH normal  Fall Risk  10/17/2017 10/06/2017  Falls in the past year? Yes Yes  Number falls in past yr: 1 2 or more  Injury with Fall? No Yes  Comment - back bruising     [facility] 10/17/2017 10/06/2017  Decreased Interest 0 1  Down, Depressed, Hopeless 0 2  PHQ - 2 Score 0 3  Altered sleeping - 2  Tired, decreased energy - 2  Change in appetite - 1  Feeling bad or failure about yourself  - 1  Trouble concentrating - 1  Moving slowly or fidgety/restless - 1  Suicidal thoughts - 0  PHQ-9 Score - 11  Difficult doing work/chores - Not difficult at all    No Known Allergies  Prior to Admission medications   Medication Sig Start Date End Date Taking? Authorizing  Provider  loratadine (CLARITIN) 10 MG tablet Take 10 mg by mouth daily.   Yes [provider]  ranitidine (ZANTAC) 150 MG tablet Take 1 tablet (150 mg total) by mouth 2 (two) times daily. 10/06/17  Yes Myles LippsSantiago, Eliani Leclere M, MD    Past Medical History:  Diagnosis Date  . Asthma   . Bronchitis   . Depression   . Depression   . Hypothyroid   . Kidney stones     Past Surgical History:  Procedure Laterality Date  . CESAREAN SECTION    . CHOLECYSTECTOMY N/A 02/19/2015   Procedure: LAPAROSCOPIC CHOLECYSTECTOMY WITH INTRAOPERATIVE CHOLANGIOGRAM;  Surgeon: Darnell Levelodd Gerkin, MD;  Location: [facility];  Service: General;  Laterality: N/A;  . DILATION AND CURETTAGE OF UTERUS    . TUBAL LIGATION      Social History   Tobacco Use  . Smoking status: Never Smoker  . Smokeless tobacco: Never Used  Substance Use Topics  . Alcohol use: No    No family history on file.  ROS Per hpi  OBJECTIVE: Blood pressure 110/70, pulse 90, temperature 98.4 F (36.9 C), temperature source Oral, resp. rate 17, height 5\' 4"  (1.626 m), weight 234 lb (106.1 kg), last menstrual period 10/04/2017, SpO2 98 %, unknown if currently breastfeeding.  Physical Exam  Constitutional: She is oriented to person, place, and  time. She appears well-developed and well-nourished.  HENT:  Head: Normocephalic and atraumatic.  Right Ear: Hearing, tympanic membrane, external ear and ear canal normal.  Left Ear: Hearing, tympanic membrane, external ear and ear canal normal.  Mouth/Throat: Mucous membranes are normal. Posterior oropharyngeal erythema present. No oropharyngeal exudate. No tonsillar exudate.  Eyes: Pupils are equal, round, and reactive to light. Conjunctivae and EOM are normal.  Neck: Neck supple.  Cardiovascular: Normal rate, regular rhythm and normal heart sounds. Exam reveals no gallop and no friction rub.  No murmur heard. Pulmonary/Chest: Effort normal. She has decreased breath sounds. She has wheezes. She has  no rhonchi. She has no rales.  Lymphadenopathy:    She has cervical adenopathy.  Neurological: She is alert and oriented to person, place, and time.  Skin: Skin is warm and dry. Rash (left elbow large erythematous hives in round and serpentine distribution) noted.  Nursing note and vitals reviewed.   ASSESSMENT and PLAN  1. Mild intermittent asthma with acute exacerbation 2. Acute upper respiratory infection Discussed supportive measures, new meds r/se/b and RTC precautions. Patient educational handout given.  3. Rash and nonspecific skin eruption suggestive of poison ivy, etc. discussed supportive measures, new med r/se/b  Other orders - predniSONE (DELTASONE) 20 MG tablet; Take 2 tablets (40 mg total) by mouth daily with breakfast. - albuterol (PROVENTIL HFA;VENTOLIN HFA) 108 (90 Base) MCG/ACT inhaler; Inhale 2 puffs into the lungs every 6 (six) hours as needed for wheezing or shortness of breath. - triamcinolone cream (KENALOG) 0.1 %; Apply 1 application topically 2 (two) times daily.  Return in about 2 weeks (around 10/31/2017).    Myles Lipps, MD Primary Care at [facility] [address] [place], [place] [address] Ph.  [phone] Fax [phone]

## 2017-11-03 ENCOUNTER — Ambulatory Visit (INDEPENDENT_AMBULATORY_CARE_PROVIDER_SITE_OTHER): Payer: BLUE CROSS/BLUE SHIELD | Admitting: Family Medicine

## 2017-11-03 ENCOUNTER — Encounter: Payer: Self-pay | Admitting: Family Medicine

## 2017-11-03 VITALS — BP 108/64 | HR 78 | Temp 98.9°F | Ht 64.57 in | Wt 229.0 lb

## 2017-11-03 DIAGNOSIS — M7918 Myalgia, other site: Secondary | ICD-10-CM

## 2017-11-03 DIAGNOSIS — J4521 Mild intermittent asthma with (acute) exacerbation: Secondary | ICD-10-CM

## 2017-11-03 DIAGNOSIS — M545 Low back pain, unspecified: Secondary | ICD-10-CM

## 2017-11-03 LAB — POCT URINALYSIS DIP (MANUAL ENTRY)
Bilirubin, UA: NEGATIVE
Blood, UA: NEGATIVE
Glucose, UA: NEGATIVE mg/dL
Ketones, POC UA: NEGATIVE mg/dL
Leukocytes, UA: NEGATIVE
Nitrite, UA: NEGATIVE
Protein Ur, POC: NEGATIVE mg/dL
Spec Grav, UA: 1.02 (ref 1.010–1.025)
Urobilinogen, UA: 0.2 E.U./dL
pH, UA: 7 (ref 5.0–8.0)

## 2017-11-03 MED ORDER — IBUPROFEN 600 MG PO TABS: 600.0000 mg | ORAL_TABLET | Freq: Three times a day (TID) | ORAL | 0 refills | Status: DC | PRN

## 2017-11-03 NOTE — Progress Notes (Signed)
6/28/201911:04 AM  Faith CassetteBrittney N Beck 1990/08/05, 27 y.o. female [id number]  Chief Complaint  Patient presents with  . Follow-up    asthma has gotten alot better since the move to new house. Old hs was found to have mold.  . Abdominal Pain    for the past 3 days having radiating pain that starts in the stomach that radiates to the right hip area    HPI:   Patient is a 27 y.o. female with past medical history significant for mild intermittent asthma who presents today for followup  She moved to a mold free home She completed pred burst Has used albuterol only twice States breathing back to normal. No cough, SOB, chest tightness or wheezing  She is c/o right low back/buttock pain Radiates to the front hip Worse at night Denies any injuries, though has been moving recently Denies dysuria, hematuria. She does report frequency. Denies any fever, chills Has not taken anything for this  Fall Risk  11/03/2017 10/17/2017 10/06/2017  Falls in the past year? No Yes Yes  Number falls in past yr: - 1 2 or more  Injury with Fall? - No Yes  Comment - - back bruising     [facility] 11/03/2017 10/17/2017 10/06/2017  Decreased Interest 0 0 1  Down, Depressed, Hopeless 0 0 2  PHQ - 2 Score 0 0 3  Altered sleeping - - 2  Tired, decreased energy - - 2  Change in appetite - - 1  Feeling bad or failure about yourself  - - 1  Trouble concentrating - - 1  Moving slowly or fidgety/restless - - 1  Suicidal thoughts - - 0  PHQ-9 Score - - 11  Difficult doing work/chores - - Not difficult at all    No Known Allergies  Prior to Admission medications   Medication Sig Start Date End Date Taking? Authorizing Provider  albuterol (PROVENTIL HFA;VENTOLIN HFA) 108 (90 Base) MCG/ACT inhaler Inhale 2 puffs into the lungs every 6 (six) hours as needed for wheezing or shortness of breath. 10/17/17   Myles LippsSantiago, Gerrick Ray M, MD  loratadine (CLARITIN) 10 MG tablet Take 10 mg by mouth daily.     [provider]  ranitidine (ZANTAC) 150 MG tablet Take 1 tablet (150 mg total) by mouth 2 (two) times daily. 10/06/17   Myles LippsSantiago, Raphaela Cannaday M, MD    Past Medical History:  Diagnosis Date  . Asthma   . Bronchitis   . Depression   . Depression   . Hypothyroid   . Kidney stones     Past Surgical History:  Procedure Laterality Date  . CESAREAN SECTION    . CHOLECYSTECTOMY N/A 02/19/2015   Procedure: LAPAROSCOPIC CHOLECYSTECTOMY WITH INTRAOPERATIVE CHOLANGIOGRAM;  Surgeon: Darnell Levelodd Gerkin, MD;  Location: [facility];  Service: General;  Laterality: N/A;  . DILATION AND CURETTAGE OF UTERUS    . TUBAL LIGATION      Social History   Tobacco Use  . Smoking status: Never Smoker  . Smokeless tobacco: Never Used  Substance Use Topics  . Alcohol use: No    Family History  Problem Relation Age of Onset  . Asthma Mother   . COPD Mother   . Cancer Father   . Diabetes Father   . Heart disease Father   . Migraines Brother   . Asthma Daughter   . Healthy Son     ROS Per hpi  OBJECTIVE:  Blood pressure 108/64, pulse 78, temperature 98.9 F (37.2 C),  temperature source Oral, height 5' 4.57" (1.64 m), weight 229 lb (103.9 kg), last menstrual period 09/29/2017, SpO2 97 %, unknown if currently breastfeeding.  Physical Exam  Constitutional: She is oriented to person, place, and time. She appears well-developed and well-nourished.  HENT:  Head: Normocephalic and atraumatic.  Mouth/Throat: Oropharynx is clear and moist. No oropharyngeal exudate.  Eyes: Pupils are equal, round, and reactive to light. EOM are normal. No scleral icterus.  Neck: Neck supple.  Cardiovascular: Normal rate, regular rhythm and normal heart sounds. Exam reveals no gallop and no friction rub.  No murmur heard. Pulmonary/Chest: Effort normal and breath sounds normal. She has no wheezes. She has no rhonchi. She has no rales.  Abdominal: There is no CVA tenderness.  Musculoskeletal: She exhibits no edema.        Right hip: She exhibits tenderness (+ TTP over gluteal muscles and greater trochanter). She exhibits normal range of motion, normal strength, no swelling and no crepitus.       Left hip: Normal.       Lumbar back: She exhibits tenderness (right sided paraspinal TTP).  Neurological: She is alert and oriented to person, place, and time. She has normal strength and normal reflexes. Gait normal.  Neg SLR  Skin: Skin is warm and dry.  Psychiatric: She has a normal mood and affect.  Nursing note and vitals reviewed.     Results for orders placed or performed in visit on 11/03/17 (from the past 24 hour(s))  POCT urinalysis dipstick     Status: Normal   Collection Time: 11/03/17 11:12 AM  Result Value Ref Range   Color, UA yellow yellow   Clarity, UA clear clear   Glucose, UA negative negative mg/dL   Bilirubin, UA negative negative   Ketones, POC UA negative negative mg/dL   Spec Grav, UA 1.610 9.604 - 1.025   Blood, UA negative negative   pH, UA 7.0 5.0 - 8.0   Protein Ur, POC negative negative mg/dL   Urobilinogen, UA 0.2 0.2 or 1.0 E.U./dL   Nitrite, UA Negative Negative   Leukocytes, UA Negative Negative      ASSESSMENT and PLAN  1. Acute right-sided low back pain without sciatica 2. Gluteal pain Discussed supportive measures, new meds r/se/b and RTC precautions. Patient educational handout given. - POCT urinalysis dipstick - ibuprofen (ADVIL,MOTRIN) 600 MG tablet; Take 1 tablet (600 mg total) by mouth every 8 (eight) hours as needed.  3. Mild intermittent asthma with acute exacerbation Resolved.   Return if symptoms worsen or fail to improve.    Myles Lipps, MD Primary Care at [facility] [address] [place], [place] [address] Ph.  [phone] Fax [phone]

## 2017-11-03 NOTE — Patient Instructions (Addendum)
   If directed, apply ice to the injured area: ? Put ice in a plastic bag. ? Place a towel between your skin and the bag. ? Leave the ice on for 20 minutes, 2-3 times per day.  Alternate ice and heat. Stretch area with exercises given. Return if not getting better over the next 2 weeks  IF you received an x-ray today, you will receive an invoice from [facility]. Please contact [facility] at [phone] with questions or concerns regarding your invoice.   IF you received labwork today, you will receive an invoice from [facility]. Please contact [facility] at [phone] with questions or concerns regarding your invoice.   Our billing staff will not be able to assist you with questions regarding bills from these companies.  You will be contacted with the lab results as soon as they are available. The fastest way to get your results is to activate your My Chart account. Instructions are located on the last page of this paperwork. If you have not heard from us regarding the results in 2 weeks, please contact this office.

## 2017-11-19 ENCOUNTER — Encounter: Payer: Self-pay | Admitting: Family Medicine

## 2017-11-23 ENCOUNTER — Encounter: Payer: Self-pay | Admitting: Physician Assistant

## 2017-11-23 ENCOUNTER — Other Ambulatory Visit: Payer: Self-pay

## 2017-11-23 ENCOUNTER — Ambulatory Visit (INDEPENDENT_AMBULATORY_CARE_PROVIDER_SITE_OTHER): Payer: BLUE CROSS/BLUE SHIELD | Admitting: Physician Assistant

## 2017-11-23 VITALS — BP 108/74 | HR 77 | Temp 97.9°F | Ht 63.98 in | Wt 228.8 lb

## 2017-11-23 DIAGNOSIS — L0291 Cutaneous abscess, unspecified: Secondary | ICD-10-CM

## 2017-11-23 MED ORDER — DOXYCYCLINE HYCLATE 100 MG PO CAPS: 100.0000 mg | ORAL_CAPSULE | Freq: Two times a day (BID) | ORAL | 0 refills | Status: DC

## 2017-11-23 NOTE — Progress Notes (Signed)
   Faith Beck  MRN: [id number] DOB: 1990-05-29  Subjective:  Faith Beck is a 27 y.o. female seen in office today for a chief complaint of spot on lower left abdomen. It appeared 6 days ago. 5 days ago, was in hot bathtub, and the lesion popped and pus came out. She squeezed it really hard and got some brown discharge. Since then she has noticed a little more pus drainage but overall the spot has decreased in size and in pain. Has some associated redness. Denies fever and chills. No PMH of diabetes or MRSA. Denies smoking. No other questions or concerns.   Review of Systems  Per HPI  Patient Active Problem List   Diagnosis Date Noted  . Cholecystitis 02/19/2015  . Cholecystitis, acute 02/19/2015  . Gallbladder calculus with acute cholecystitis 02/19/2015    Current Outpatient Medications on File Prior to Visit  Medication Sig Dispense Refill  . albuterol (PROVENTIL HFA;VENTOLIN HFA) 108 (90 Base) MCG/ACT inhaler Inhale 2 puffs into the lungs every 6 (six) hours as needed for wheezing or shortness of breath. 1 Inhaler 3  . ibuprofen (ADVIL,MOTRIN) 600 MG tablet Take 1 tablet (600 mg total) by mouth every 8 (eight) hours as needed. 30 tablet 0  . loratadine (CLARITIN) 10 MG tablet Take 10 mg by mouth daily.    . ranitidine (ZANTAC) 150 MG tablet Take 1 tablet (150 mg total) by mouth 2 (two) times daily. 60 tablet 5   No current facility-administered medications on file prior to visit.     No Known Allergies   Objective:  BP 108/74 (BP Location: Left Arm, Patient Position: Sitting, Cuff Size: Normal)   Pulse 77   Temp 97.9 F (36.6 C) (Oral)   Ht 5' 3.98" (1.625 m)   Wt 228 lb 12.8 oz (103.8 kg)   LMP 11/13/2017   SpO2 99%   BMI 39.30 kg/m   Physical Exam  Constitutional: She is oriented to person, place, and time. She appears well-developed and well-nourished.  HENT:  Head: Normocephalic and atraumatic.  Eyes: Conjunctivae are normal.  Neck: Normal range of  motion.  Pulmonary/Chest: Effort normal.  Neurological: She is alert and oriented to person, place, and time.  Skin: Skin is warm and dry.     Psychiatric: She has a normal mood and affect.  Vitals reviewed.   Assessment and Plan :  1. Abscess Pt is overall well appearing, NAD. Vitals stable, she is afebrile. Hx and PE findings consistent for skin abscess, which has already drained. No fluctuance noted and therefore I&D not warranted. Rec oral abx and warm compresses to affected area. Advised to follow up if sx worsen, develop new concerning sx, or no full improvement after abx course.  - doxycycline (VIBRAMYCIN) 100 MG capsule; Take 1 capsule (100 mg total) by mouth 2 (two) times daily.  Dispense: 20 capsule; Refill: 0  Side effects, risks, benefits, and alternatives of the medications and treatment plan prescribed today were discussed, and patient expressed understanding of the instructions given. No barriers to understanding were identified. Red flags discussed in detail. Pt expressed understanding regarding what to do in case of emergency/urgent symptoms.  Benjiman CoreBrittany Atlas Crossland PA-C  [facility] at [facility]  [facility] 11/23/2017 11:08 AM

## 2017-11-23 NOTE — Patient Instructions (Addendum)
This was likely an abscess but luckily you have done the job of getting out the pus. I recommend taking daily antibiotic for the next ten days. Also, use heating pad to affected area 2-3 times a night for 20 minutes at a time and this will help release any underlying pus. Also, keep it covered until fully healed. You can continue washing with soap and water. Follow up in pus collection forms, symptoms worsen, or do not fully resolve after 10 days, or you develop new fever, chills, or sweating.    While taking Doxycycline:  -Do not drink milk or take iron supplements, multivitamins, calcium supplements, antacids, laxatives within 2 hours before or after taking doxycycline. -Avoid direct exposure to sunlight or tanning beds. Doxycycline can make you sunburn more easily. Wear protective clothing and use sunscreen (SPF 30 or higher) when you are outdoors. -Antibiotic medicines can cause diarrhea, which may be a sign of a new infection. If you have diarrhea that is watery or bloody, stop taking this medicine and seek medical care.    IF you received an x-ray today, you will receive an invoice from [facility]. Please contact [facility] at [phone] with questions or concerns regarding your invoice.   IF you received labwork today, you will receive an invoice from [facility]. Please contact [facility] at [phone] with questions or concerns regarding your invoice.   Our billing staff will not be able to assist you with questions regarding bills from these companies.  You will be contacted with the lab results as soon as they are available. The fastest way to get your results is to activate your My Chart account. Instructions are located on the last page of this paperwork. If you have not heard from us regarding the results in 2 weeks, please contact this office.

## 2018-01-05 ENCOUNTER — Encounter (HOSPITAL_COMMUNITY): Payer: Self-pay | Admitting: *Deleted

## 2018-01-05 ENCOUNTER — Emergency Department (HOSPITAL_COMMUNITY): Payer: BLUE CROSS/BLUE SHIELD

## 2018-01-05 ENCOUNTER — Emergency Department (HOSPITAL_COMMUNITY)
Admission: EM | Admit: 2018-01-05 | Discharge: 2018-01-05 | Disposition: A | Discharge status: Home / Self Care | Payer: BLUE CROSS/BLUE SHIELD | Attending: Emergency Medicine | Admitting: Emergency Medicine

## 2018-01-05 ENCOUNTER — Other Ambulatory Visit: Payer: Self-pay

## 2018-01-05 DIAGNOSIS — Z79899 Other long term (current) drug therapy: Secondary | ICD-10-CM | POA: Diagnosis not present

## 2018-01-05 DIAGNOSIS — R319 Hematuria, unspecified: Secondary | ICD-10-CM | POA: Diagnosis not present

## 2018-01-05 DIAGNOSIS — F1210 Cannabis abuse, uncomplicated: Secondary | ICD-10-CM | POA: Insufficient documentation

## 2018-01-05 DIAGNOSIS — R1030 Lower abdominal pain, unspecified: Secondary | ICD-10-CM | POA: Insufficient documentation

## 2018-01-05 DIAGNOSIS — M545 Low back pain: Secondary | ICD-10-CM | POA: Insufficient documentation

## 2018-01-05 DIAGNOSIS — E039 Hypothyroidism, unspecified: Secondary | ICD-10-CM | POA: Diagnosis not present

## 2018-01-05 DIAGNOSIS — N200 Calculus of kidney: Secondary | ICD-10-CM | POA: Diagnosis not present

## 2018-01-05 DIAGNOSIS — J45909 Unspecified asthma, uncomplicated: Secondary | ICD-10-CM | POA: Insufficient documentation

## 2018-01-05 DIAGNOSIS — R109 Unspecified abdominal pain: Secondary | ICD-10-CM | POA: Insufficient documentation

## 2018-01-05 DIAGNOSIS — R1031 Right lower quadrant pain: Secondary | ICD-10-CM | POA: Diagnosis not present

## 2018-01-05 LAB — URINALYSIS, ROUTINE W REFLEX MICROSCOPIC
Bilirubin Urine: NEGATIVE
Glucose, UA: NEGATIVE mg/dL
Hgb urine dipstick: NEGATIVE
KETONES UR: NEGATIVE mg/dL
LEUKOCYTES UA: NEGATIVE
NITRITE: NEGATIVE
PH: 5 (ref 5.0–8.0)
Protein, ur: NEGATIVE mg/dL
SPECIFIC GRAVITY, URINE: 1.026 (ref 1.005–1.030)

## 2018-01-05 LAB — CBC
HEMATOCRIT: 38.3 % (ref 36.0–46.0)
Hemoglobin: 12.5 g/dL (ref 12.0–15.0)
MCH: 28.6 pg (ref 26.0–34.0)
MCHC: 32.6 g/dL (ref 30.0–36.0)
MCV: 87.6 fL (ref 78.0–100.0)
Platelets: 211 10*3/uL (ref 150–400)
RBC: 4.37 MIL/uL (ref 3.87–5.11)
RDW: 14 % (ref 11.5–15.5)
WBC: 8.2 10*3/uL (ref 4.0–10.5)

## 2018-01-05 LAB — COMPREHENSIVE METABOLIC PANEL
ALT: 12 U/L (ref 0–44)
ANION GAP: 9 (ref 5–15)
AST: 26 U/L (ref 15–41)
Albumin: 3.9 g/dL (ref 3.5–5.0)
Alkaline Phosphatase: 65 U/L (ref 38–126)
BUN: 17 mg/dL (ref 6–20)
CALCIUM: 9.2 mg/dL (ref 8.9–10.3)
CHLORIDE: 109 mmol/L (ref 98–111)
CO2: 25 mmol/L (ref 22–32)
Creatinine, Ser: 0.84 mg/dL (ref 0.44–1.00)
GFR calc non Af Amer: >60 mL/min (ref >60)
Glucose, Bld: 77 mg/dL (ref 70–99)
Potassium: 3.6 mmol/L (ref 3.5–5.1)
SODIUM: 143 mmol/L (ref 135–145)
Total Bilirubin: 1.2 mg/dL (ref 0.3–1.2)
Total Protein: 6.6 g/dL (ref 6.5–8.1)

## 2018-01-05 LAB — I-STAT BETA HCG BLOOD, ED (MC, WL, AP ONLY): I-stat hCG, quantitative: <5 m[IU]/mL (ref <5)

## 2018-01-05 MED ORDER — ONDANSETRON HCL 4 MG/2ML IJ SOLN
4.0000 mg | Freq: Once | INTRAMUSCULAR | Status: AC
  Administered 2018-01-05: 4 mg via INTRAVENOUS
  Filled 2018-01-05: qty 2

## 2018-01-05 MED ORDER — HYDROMORPHONE HCL 1 MG/ML IJ SOLN
0.5000 mg | Freq: Once | INTRAMUSCULAR | Status: AC
  Administered 2018-01-05: 0.5 mg via INTRAVENOUS
  Filled 2018-01-05: qty 1

## 2018-01-05 NOTE — ED Provider Notes (Signed)
[facility]-EMERGENCY DEPT Provider Note   CSN: [id number] Arrival date & time: 01/05/18  0123     History   Chief Complaint Chief Complaint  Patient presents with  . Flank Pain    HPI Faith Beck is a 27 y.o. female.  The history is provided by the patient and medical records.  Flank Pain     27 year old female presenting to the ED with right-sided flank pain.  States this began on Monday and has been progressively worsening.  States pain initially started her right lower back and has since radiated around to her right lower abdomen and groin.  Reports she has been having trouble initiating her urinary stream and feels her bladder does not completely empty each time she urinates.  Has noticed some hematuria but denies dysuria.  She has not had any new pelvic pain or vaginal discharge.  Does have history of kidney stones, last occurrence about a year ago which passed spontaneously.  Has history of ovarian cysts as well.  Past Medical History:  Diagnosis Date  . Asthma   . Bronchitis   . Depression   . Depression   . Hypothyroid   . Kidney stones     Patient Active Problem List   Diagnosis Date Noted  . Cholecystitis 02/19/2015  . Cholecystitis, acute 02/19/2015  . Gallbladder calculus with acute cholecystitis 02/19/2015    Past Surgical History:  Procedure Laterality Date  . CESAREAN SECTION    . CHOLECYSTECTOMY N/A 02/19/2015   Procedure: LAPAROSCOPIC CHOLECYSTECTOMY WITH INTRAOPERATIVE CHOLANGIOGRAM;  Surgeon: Darnell Level, MD;  Location: [facility];  Service: General;  Laterality: N/A;  . DILATION AND CURETTAGE OF UTERUS    . TUBAL LIGATION       OB History    Gravida  3   Para      Term      Preterm      AB      Living  2     SAB      TAB      Ectopic      Multiple      Live Births  2            Home Medications    Prior to Admission medications   Medication Sig Start Date End Date Taking? Authorizing  Provider  albuterol (PROVENTIL HFA;VENTOLIN HFA) 108 (90 Base) MCG/ACT inhaler Inhale 2 puffs into the lungs every 6 (six) hours as needed for wheezing or shortness of breath. 10/17/17   Myles Lipps, MD  doxycycline (VIBRAMYCIN) 100 MG capsule Take 1 capsule (100 mg total) by mouth 2 (two) times daily. 11/23/17   Benjiman Core D, PA-C  ibuprofen (ADVIL,MOTRIN) 600 MG tablet Take 1 tablet (600 mg total) by mouth every 8 (eight) hours as needed. 11/03/17   Myles Lipps, MD  loratadine (CLARITIN) 10 MG tablet Take 10 mg by mouth daily.    [provider]  ranitidine (ZANTAC) 150 MG tablet Take 1 tablet (150 mg total) by mouth 2 (two) times daily. 10/06/17   Myles Lipps, MD    Family History Family History  Problem Relation Age of Onset  . Asthma Mother   . COPD Mother   . Cancer Father   . Diabetes Father   . Heart disease Father   . Migraines Brother   . Asthma Daughter   . Healthy Son     Social History Social History   Tobacco Use  . Smoking status:  Never Smoker  . Smokeless tobacco: Never Used  Substance Use Topics  . Alcohol use: No  . Drug use: Yes    Types: Marijuana     Allergies   Patient has no known allergies.   Review of Systems Review of Systems  Genitourinary: Positive for difficulty urinating, flank pain and hematuria.  All other systems reviewed and are negative.    Physical Exam Updated Vital Signs BP (!) 127/91 (BP Location: Left Arm)   Pulse 93   Temp 98.2 F (36.8 C) (Oral)   Resp 18   LMP 12/24/2017   SpO2 100%   Physical Exam  Constitutional: She is oriented to person, place, and time. She appears well-developed and well-nourished.  Appears uncomfortable  HENT:  Head: Normocephalic and atraumatic.  Mouth/Throat: Oropharynx is clear and moist.  Eyes: Pupils are equal, round, and reactive to light. Conjunctivae and EOM are normal.  Neck: Normal range of motion.  Cardiovascular: Normal rate, regular rhythm and  normal heart sounds.  Pulmonary/Chest: Effort normal and breath sounds normal. No stridor. No respiratory distress.  Abdominal: Soft. Bowel sounds are normal. There is no tenderness. There is CVA tenderness (right). There is no rebound.    Musculoskeletal: Normal range of motion.  Neurological: She is alert and oriented to person, place, and time.  Skin: Skin is warm and dry.  Psychiatric: She has a normal mood and affect.  Nursing note and vitals reviewed.    ED Treatments / Results  Labs (all labs ordered are listed, but only abnormal results are displayed) Labs Reviewed  COMPREHENSIVE METABOLIC PANEL  URINALYSIS, ROUTINE W REFLEX MICROSCOPIC  CBC  I-STAT BETA HCG BLOOD, ED ([facility], [facility], [facility] ONLY)    EKG None  Radiology Ct Renal Stone Study  Result Date: 01/05/2018 CLINICAL DATA:  27 year old female with history of kidney stones presenting with flank pain. EXAM: CT ABDOMEN AND PELVIS WITHOUT CONTRAST TECHNIQUE: Multidetector CT imaging of the abdomen and pelvis was performed following the standard protocol without IV contrast. COMPARISON:  CT of the abdomen pelvis dated 01/17/2017 FINDINGS: Evaluation of this exam is limited in the absence of intravenous contrast. Lower chest: The visualized lung bases are clear. No intra-abdominal free air or free fluid. Hepatobiliary: The liver is unremarkable. No intrahepatic biliary ductal dilatation. Cholecystectomy. Pancreas: Unremarkable. No pancreatic ductal dilatation or surrounding inflammatory changes. Spleen: Normal in size without focal abnormality. Adrenals/Urinary Tract: The adrenal glands are unremarkable. Multiple nonobstructing bilateral renal calculi measure up to 4 mm in the interpolar aspect of the right kidney. There is no hydronephrosis on either side. The visualized ureters and urinary bladder appear unremarkable. Stomach/Bowel: There is mild thickening of the distal esophagus similar to prior CT. There is no bowel obstruction or  active inflammation. Normal appendix. Vascular/Lymphatic: The abdominal aorta and IVC are grossly unremarkable on this noncontrast CT. No portal venous gas. Mildly enlarged lymph node adjacent to the distal esophagus measuring up to 13 mm in short axis similar to prior CT. This is of indeterminate clinical significance or etiology. Clinical correlation is recommended. Reproductive: The uterus is anteverted and grossly unremarkable. The ovaries appear unremarkable as well. Other: None Musculoskeletal: No acute or significant osseous findings. IMPRESSION: 1. Multiple small nonobstructing bilateral renal calculi. No hydronephrosis. 2. Mild thickened appearance of the distal esophagus similar to prior CT with associated mildly enlarged adjacent lymph nodes of indeterminate clinical significance or etiology. Clinical correlation is recommended. No bowel obstruction. Normal appendix. Electronically Signed   By: Ceasar Mons.D.  On: 01/05/2018 03:27    Procedures Procedures (including critical care time)  Medications Ordered in ED Medications  ondansetron (ZOFRAN) injection 4 mg (4 mg Intravenous Given 01/05/18 0253)  HYDROmorphone (DILAUDID) injection 0.5 mg (0.5 mg Intravenous Given 01/05/18 0252)     Initial Impression / Assessment and Plan / ED Course  I have reviewed the triage vital signs and the nursing notes.  Pertinent labs & imaging results that were available during my care of the patient were reviewed by me and considered in my medical decision making (see chart for details).  27 year old female here with right-sided flank pain for the past few days.  Has history of kidney stones.  She is afebrile and nontoxic but does appear uncomfortable.  Tenderness of the right lateral abdomen and flank.  Labs and urinalysis overall reassuring.  CT obtained, evidence of bilateral kidney stones, however no ureteral stone or hydronephrosis.  No acute findings in the ovaries or pelvis.  Patient does have  incidental finding of distal esophageal edema as well as some surrounding lymphadenopathy.  Patient reports she did previously see GI several years ago for some "digestive issues" and had EGD.  She has not had any follow-up in the past several years.  No recent GERD type symptoms, hiccups, or vomiting.  We will have her follow-up with local gastroenterologist, she may need endoscopy.  Return here for any new/acute changes.  Final Clinical Impressions(s) / ED Diagnoses   Final diagnoses:  Right flank pain    ED Discharge Orders    None       Garlon HatchetSanders, Lisa M, PA-C 01/05/18 [phone]    Devoria AlbeKnapp, Iva, MD 01/05/18 [phone]

## 2018-01-05 NOTE — Discharge Instructions (Signed)
You did have some kidney stones seen on CT today, not currently passing. We did see some edema along the end of your esophagus with some enlarged lymph nodes around it. We recommend you follow-up with GI-- call to schedule appt. Return to the ED for new or worsening symptoms.

## 2018-01-05 NOTE — ED Triage Notes (Signed)
Pt reports right lower back pain that radiates to the right lower abdomen since Monday, associated with nausea. No vaginal bleeding, discharge. Reports she feels like she has trouble starting to urinate. Hx of ovarian cyst and kidney stones.

## 2018-01-10 ENCOUNTER — Encounter: Payer: Self-pay | Admitting: Family Medicine

## 2018-01-10 ENCOUNTER — Other Ambulatory Visit: Payer: Self-pay

## 2018-01-10 ENCOUNTER — Ambulatory Visit (INDEPENDENT_AMBULATORY_CARE_PROVIDER_SITE_OTHER): Payer: BLUE CROSS/BLUE SHIELD | Admitting: Family Medicine

## 2018-01-10 ENCOUNTER — Encounter: Payer: Self-pay | Admitting: Gastroenterology

## 2018-01-10 VITALS — BP 118/79 | HR 67 | Temp 97.8°F | Ht 63.0 in | Wt 228.0 lb

## 2018-01-10 DIAGNOSIS — F419 Anxiety disorder, unspecified: Secondary | ICD-10-CM | POA: Diagnosis not present

## 2018-01-10 DIAGNOSIS — F329 Major depressive disorder, single episode, unspecified: Secondary | ICD-10-CM

## 2018-01-10 DIAGNOSIS — N200 Calculus of kidney: Secondary | ICD-10-CM

## 2018-01-10 DIAGNOSIS — J4521 Mild intermittent asthma with (acute) exacerbation: Secondary | ICD-10-CM | POA: Diagnosis not present

## 2018-01-10 DIAGNOSIS — F32A Depression, unspecified: Secondary | ICD-10-CM

## 2018-01-10 DIAGNOSIS — R933 Abnormal findings on diagnostic imaging of other parts of digestive tract: Secondary | ICD-10-CM

## 2018-01-10 LAB — POCT URINALYSIS DIP (MANUAL ENTRY)
Bilirubin, UA: NEGATIVE
Blood, UA: NEGATIVE
Glucose, UA: NEGATIVE mg/dL
Ketones, POC UA: NEGATIVE mg/dL
Leukocytes, UA: NEGATIVE
Nitrite, UA: NEGATIVE
Protein Ur, POC: NEGATIVE mg/dL
Spec Grav, UA: 1.02 (ref 1.010–1.025)
Urobilinogen, UA: 0.2 E.U./dL
pH, UA: 7.5 (ref 5.0–8.0)

## 2018-01-10 LAB — POC MICROSCOPIC URINALYSIS (UMFC): Mucus: ABSENT

## 2018-01-10 MED ORDER — TAMSULOSIN HCL 0.4 MG PO CAPS
0.4000 mg | ORAL_CAPSULE | Freq: Every day | ORAL | 0 refills | Status: DC
Start: 2018-01-10 — End: 2018-02-21

## 2018-01-10 MED ORDER — ARIPIPRAZOLE 2 MG PO TABS: 2.0000 mg | ORAL_TABLET | Freq: Every day | ORAL | 0 refills | Status: DC

## 2018-01-10 MED ORDER — ESCITALOPRAM OXALATE 10 MG PO TABS: 10.0000 mg | ORAL_TABLET | Freq: Every day | ORAL | 0 refills | Status: DC

## 2018-01-10 MED ORDER — ALBUTEROL SULFATE (2.5 MG/3ML) 0.083% IN NEBU
2.5000 mg | INHALATION_SOLUTION | Freq: Once | RESPIRATORY_TRACT | Status: DC
Start: 2018-01-10 — End: 2018-02-21

## 2018-01-10 MED ORDER — PREDNISONE 20 MG PO TABS: 40.0000 mg | ORAL_TABLET | Freq: Every day | ORAL | 0 refills | Status: DC

## 2018-01-10 NOTE — Patient Instructions (Addendum)
For therapy -- 1. [facility] (Beth Kincaid, Ernest McCoy,Heather Kitchens, Karla Townsend) - [phone]  2. [facility] (Julie Whitt) - [phone] 3. [facility] - [phone] 4. [facility] - [phone] (do not file insurance) 5. [facility] - accepts most major insurances. [phone] or email [email or web address]    If you have lab work done today you will be contacted with your lab results within the next 2 weeks.  If you have not heard from us then please contact us. The fastest way to get your results is to register for My Chart.   IF you received an x-ray today, you will receive an invoice from [facility]. Please contact [facility] at [phone] with questions or concerns regarding your invoice.   IF you received labwork today, you will receive an invoice from [facility]. Please contact [facility] at [phone] with questions or concerns regarding your invoice.   Our billing staff will not be able to assist you with questions regarding bills from these companies.  You will be contacted with the lab results as soon as they are available. The fastest way to get your results is to activate your My Chart account. Instructions are located on the last page of this paperwork. If you have not heard from us regarding the results in 2 weeks, please contact this office.     

## 2018-01-10 NOTE — Progress Notes (Signed)
9/4/20199:18 AM  Faith Beck 1990/10/17, 27 y.o. female [id number]  Chief Complaint  Patient presents with  . Follow-up    still dealing with asthma and glutel pain. Had asthma attack last night. Chest feels heavy.   . Anxiety  . Depression    took zoloft in the past. Wants to discuss getting back on a mediction for depression    HPI:   Patient is a 27 y.o. female with past medical history significant for mild intermittent asthma, h/o kidney stones who presents today for ER followup   Seen in ER 01/05/18 for Right flank pain stabbing, radiates towards the front, feeling nauseous Feels like previous kidney stones a year, has not had surgeries or interventions Was given IV meds for pain and nausea , helped for about a 45 minutes  Continues to have same sx Sometimes feels like that she has problems initiating urination and sometimes feels like she cant completely empty bladder, mild occ dysuria No fever but has chills  CT renal stone study 01/05/18  IMPRESSION: 1. Multiple small nonobstructing bilateral renal calculi. No hydronephrosis. 2. Mild thickened appearance of the distal esophagus similar to prior CT with associated mildly enlarged adjacent lymph nodes of indeterminate clinical significance or etiology. Clinical correlation is recommended. No bowel obstruction. Normal appendix.   Reports EGD 2015, hiatal hernia, no other findings per patient  Patient presents with worsening depression and anxiety Requesting to restart medications TSH in may 2019 was normal Previous h/o depression and anxiety Past trials: lexapro - 2011, could not afford it, was helping pristiq - too expensive effexor - changed quickly to pristiq, does not remember why sertaline - at 100mg  tried to commit suicide, April 2016, she was having SI thoughts and she checked herself into [facility], increased sertraline 100mg , October 12 2014, took all her sleeping pills, afterwards in [place] for about 3  weeks On Monday had horrible panic attack at work, had to leave work as could not calm down Denies SI  Also having issues with her asthma For past several days having more chest tightness and wheezing, SOB Using albuterol more, woke up in middle of night due to SOB and coughing, coughed so much she vomited, albuterol helped some Last exacerbation was in June 2019  Fall Risk  11/23/2017 11/03/2017 10/17/2017 10/06/2017  Falls in the past year? No No Yes Yes  Number falls in past yr: - - 1 2 or more  Injury with Fall? - - No Yes  Comment - - - back bruising     Depression screen [facility] 2/9 01/10/2018 11/23/2017 11/03/2017  Decreased Interest 1 0 0  Down, Depressed, Hopeless 1 0 0  PHQ - 2 Score 2 0 0  Altered sleeping 3 - -  Tired, decreased energy 2 - -  Change in appetite 3 - -  Feeling bad or failure about yourself  3 - -  Trouble concentrating 1 - -  Moving slowly or fidgety/restless 3 - -  Suicidal thoughts 0 - -  PHQ-9 Score 17 - -  Difficult doing work/chores Somewhat difficult - -   GAD 7 : Generalized Anxiety Score 01/10/2018  Nervous, Anxious, on Edge 3  Control/stop worrying 3  Worry too much - different things 3  Trouble relaxing 3  Restless 3  Easily annoyed or irritable 3  Afraid - awful might happen 3  Total GAD 7 Score 21  Anxiety Difficulty Very difficult     No Known Allergies  Prior to Admission medications  Medication Sig Start Date End Date Taking? Authorizing Provider  albuterol (PROVENTIL HFA;VENTOLIN HFA) 108 (90 Base) MCG/ACT inhaler Inhale 2 puffs into the lungs every 6 (six) hours as needed for wheezing or shortness of breath. 10/17/17  Yes Myles Lipps, MD  loratadine (CLARITIN) 10 MG tablet Take 10 mg by mouth daily.   Yes [provider]  ranitidine (ZANTAC) 150 MG tablet Take 1 tablet (150 mg total) by mouth 2 (two) times daily. 10/06/17  Yes Myles Lipps, MD    Past Medical History:  Diagnosis Date  . Asthma   . Bronchitis   .  Depression   . Depression   . Hypothyroid   . Kidney stones     Past Surgical History:  Procedure Laterality Date  . CESAREAN SECTION    . CHOLECYSTECTOMY N/A 02/19/2015   Procedure: LAPAROSCOPIC CHOLECYSTECTOMY WITH INTRAOPERATIVE CHOLANGIOGRAM;  Surgeon: Darnell Level, MD;  Location: [facility];  Service: General;  Laterality: N/A;  . DILATION AND CURETTAGE OF UTERUS    . TUBAL LIGATION      Social History   Tobacco Use  . Smoking status: Never Smoker  . Smokeless tobacco: Never Used  Substance Use Topics  . Alcohol use: No    Family History  Problem Relation Age of Onset  . Asthma Mother   . COPD Mother   . Cancer Father   . Diabetes Father   . Heart disease Father   . Migraines Brother   . Asthma Daughter   . Healthy Son     ROS Per hpi  OBJECTIVE:  Blood pressure 118/79, pulse 67, temperature 97.8 F (36.6 C), temperature source Oral, height 5\' 3"  (1.6 m), weight 228 lb (103.4 kg), last menstrual period 12/29/2017, SpO2 98 %, unknown if currently breastfeeding. Body mass index is 40.39 kg/m.   PRE: Peak flow reading is 300, about 73 % of predicted, yellow zone POST: Peak flow reading is 350 about 85% of predicted, green zone  Physical Exam  Constitutional: She is oriented to person, place, and time. She appears well-developed and well-nourished.  HENT:  Head: Normocephalic and atraumatic.  Mouth/Throat: Oropharynx is clear and moist. No oropharyngeal exudate.  Eyes: Pupils are equal, round, and reactive to light. Conjunctivae and EOM are normal. No scleral icterus.  Neck: Neck supple.  Cardiovascular: Normal rate, regular rhythm and normal heart sounds. Exam reveals no gallop and no friction rub.  No murmur heard. Pulmonary/Chest: Effort normal and breath sounds normal. She has no wheezes. She has no rales.  Abdominal: There is CVA tenderness (right).  Musculoskeletal: She exhibits no edema.  Neurological: She is alert and oriented to person, place, and  time.  Skin: Skin is warm and dry.  Psychiatric: She exhibits a depressed mood.  tearful  Nursing note and vitals reviewed.   Results for orders placed or performed in visit on 01/10/18 (from the past 24 hour(s))  POCT urinalysis dipstick     Status: Abnormal   Collection Time: 01/10/18 10:17 AM  Result Value Ref Range   Color, UA yellow yellow   Clarity, UA cloudy (A) clear   Glucose, UA negative negative mg/dL   Bilirubin, UA negative negative   Ketones, POC UA negative negative mg/dL   Spec Grav, UA 3.244 0.102 - 1.025   Blood, UA negative negative   pH, UA 7.5 5.0 - 8.0   Protein Ur, POC negative negative mg/dL   Urobilinogen, UA 0.2 0.2 or 1.0 E.U./dL   Nitrite, UA Negative Negative  Leukocytes, UA Negative Negative  POCT Microscopic Urinalysis ([facility])     Status: Abnormal   Collection Time: 01/10/18 10:27 AM  Result Value Ref Range   WBC,UR,HPF,POC None None WBC/hpf   RBC,UR,HPF,POC None None RBC/hpf   Bacteria Many (A) None, Too numerous to count   Mucus Absent Absent   Epithelial Cells, UR Per Microscopy Few (A) None, Too numerous to count cells/hpf      ASSESSMENT and PLAN  1. Mild intermittent asthma with acute exacerbation Peak flow improved with albuterol. rx for pred given. Reviewed r/se/b. Consider adding ICS if seeing increase # exacerbations - albuterol (PROVENTIL) (2.5 MG/3ML) 0.083% nebulizer solution 2.5 mg  2. Abnormal computed tomography of esophagus - Ambulatory referral to Gastroenterology  3. Anxiety and depression Not well controlled. Denies SI today. Starting low dose lexapro, adding low dose abilify to provide mood stabilization until lexapro stablilized. Provided contact info for therapy  4. Kidney stones Small, should pass, push fluids, adding flomax.  - POCT Microscopic Urinalysis ([facility]) - POCT urinalysis dipstick - Urine Culture  Other orders - Check Peak Flow - escitalopram (LEXAPRO) 10 MG tablet; Take 1 tablet (10 mg total) by  mouth daily. - ARIPiprazole (ABILIFY) 2 MG tablet; Take 1 tablet (2 mg total) by mouth daily. - tamsulosin (FLOMAX) 0.4 MG CAPS capsule; Take 1 capsule (0.4 mg total) by mouth daily. - predniSONE (DELTASONE) 20 MG tablet; Take 2 tablets (40 mg total) by mouth daily with breakfast.  Return in about 1 week (around 01/17/2018) for depression.    Myles Lipps, MD Primary [facility] at [facility] [address] [place], [place] [address] Ph.  [phone] Fax [phone]

## 2018-01-11 LAB — URINE CULTURE: Organism ID, Bacteria: NO GROWTH

## 2018-01-12 ENCOUNTER — Ambulatory Visit: Payer: BLUE CROSS/BLUE SHIELD | Admitting: Family Medicine

## 2018-01-17 ENCOUNTER — Other Ambulatory Visit: Payer: Self-pay

## 2018-01-17 ENCOUNTER — Encounter: Payer: Self-pay | Admitting: Family Medicine

## 2018-01-17 ENCOUNTER — Ambulatory Visit (INDEPENDENT_AMBULATORY_CARE_PROVIDER_SITE_OTHER): Payer: BLUE CROSS/BLUE SHIELD | Admitting: Family Medicine

## 2018-01-17 VITALS — BP 124/78 | HR 77 | Temp 98.0°F | Resp 16 | Ht 63.0 in | Wt 223.0 lb

## 2018-01-17 DIAGNOSIS — F39 Unspecified mood [affective] disorder: Secondary | ICD-10-CM | POA: Diagnosis not present

## 2018-01-17 MED ORDER — VALPROIC ACID 250 MG PO CAPS: 250.0000 mg | ORAL_CAPSULE | Freq: Two times a day (BID) | ORAL | 2 refills | Status: DC

## 2018-01-17 NOTE — Patient Instructions (Addendum)
  [facility]  [phone]  [facility] [phone] [facility] [phone]      If you have lab work done today you will be contacted with your lab results within the next 2 weeks.  If you have not heard from [place] then please contact us. The fastest way to get your results is to register for My Chart.   IF you received an x-ray today, you will receive an invoice from [facility]. Please contact [facility] at [phone] with questions or concerns regarding your invoice.   IF you received labwork today, you will receive an invoice from [facility]. Please contact [facility] at [phone] with questions or concerns regarding your invoice.   Our billing staff will not be able to assist you with questions regarding bills from these companies.  You will be contacted with the lab results as soon as they are available. The fastest way to get your results is to activate your My Chart account. Instructions are located on the last page of this paperwork. If you have not heard from [place] regarding the results in 2 weeks, please contact this office.

## 2018-01-17 NOTE — Progress Notes (Signed)
9/11/20199:52 AM  Faith Beck November 11, 1990, 27 y.o. female [id number]  Chief Complaint  Patient presents with  . Anxiety    1 week follow-up pt states her anxiety is not gettimg better.     HPI:   Patient is a 27 y.o. female with past medical history significant for mild intermittent asthma, h/o kidney stones, depression and anxiety who presents today for followup  Last visit started lexapro 10mg  and abilify 2mg   States tearfulness, overall mood better Continues to struggle with anxiety, sense of panic, irritable, easily overwhelmed Struggling at work, avoiding places Her MDQ9 q1 is mostly positive except for excessive spending and being foolish/risky However she states these sx do not cause problems She reports that she feels she is either experiencing sx from [facility] or sx from MD9 , but that she never feels her mood is normal She has never seen psych other then when she was in [place] for 3 days due to SI attempt She denies any recent or current SI   Past trials: lexapro - 2011, could not afford it, was helping pristiq - too expensive effexor - changed quickly to pristiq, does not remember why sertaline - at 100mg  tried to commit suicide, April 2016, she was having SI thoughts and she checked herself into [facility], increased sertraline 100mg , October 12 2014, took all her sleeping pills, afterwards in [place] for about 3 weeks  Fall Risk  01/17/2018 11/23/2017 11/03/2017 10/17/2017 10/06/2017  Falls in the past year? No No No Yes Yes  Number falls in past yr: - - - 1 2 or more  Injury with Fall? - - - No Yes  Comment - - - - back bruising     Depression screen [facility] 2/9 01/17/2018 01/17/2018 01/10/2018  Decreased Interest 1 0 1  Down, Depressed, Hopeless 1 0 1  PHQ - 2 Score 2 0 2  Altered sleeping 3 - 3  Tired, decreased energy 3 - 2  Change in appetite 3 - 3  Feeling bad or failure about yourself  0 - 3  Trouble concentrating 3 - 1  Moving slowly or fidgety/restless 3 - 3  Suicidal  thoughts 0 - 0  PHQ-9 Score 17 - 17  Difficult doing work/chores Somewhat difficult - Somewhat difficult   GAD 7 : Generalized Anxiety Score 01/17/2018 01/10/2018  Nervous, Anxious, on Edge 3 3  Control/stop worrying 3 3  Worry too much - different things 3 3  Trouble relaxing 3 3  Restless 3 3  Easily annoyed or irritable 3 3  Afraid - awful might happen 3 3  Total GAD 7 Score 21 21  Anxiety Difficulty Extremely difficult Very difficult     No Known Allergies  Prior to Admission medications   Medication Sig Start Date End Date Taking? Authorizing Provider  albuterol (PROVENTIL HFA;VENTOLIN HFA) 108 (90 Base) MCG/ACT inhaler Inhale 2 puffs into the lungs every 6 (six) hours as needed for wheezing or shortness of breath. 10/17/17  Yes Myles Lipps, MD  ARIPiprazole (ABILIFY) 2 MG tablet Take 1 tablet (2 mg total) by mouth daily. 01/10/18  Yes Myles Lipps, MD  escitalopram (LEXAPRO) 10 MG tablet Take 1 tablet (10 mg total) by mouth daily. 01/10/18  Yes Myles Lipps, MD  loratadine (CLARITIN) 10 MG tablet Take 10 mg by mouth daily.   Yes [provider]  ranitidine (ZANTAC) 150 MG tablet Take 1 tablet (150 mg total) by mouth 2 (two) times daily. 10/06/17  Yes Leretha Pol,  Meda Coffee, MD  tamsulosin (FLOMAX) 0.4 MG CAPS capsule Take 1 capsule (0.4 mg total) by mouth daily. 01/10/18  Yes Myles Lipps, MD    Past Medical History:  Diagnosis Date  . Asthma   . Depression   . Hypothyroid   . Kidney stones     Past Surgical History:  Procedure Laterality Date  . CESAREAN SECTION    . CHOLECYSTECTOMY N/A 02/19/2015   Procedure: LAPAROSCOPIC CHOLECYSTECTOMY WITH INTRAOPERATIVE CHOLANGIOGRAM;  Surgeon: Darnell Level, MD;  Location: [facility];  Service: General;  Laterality: N/A;  . DILATION AND CURETTAGE OF UTERUS    . TUBAL LIGATION      Social History   Tobacco Use  . Smoking status: Never Smoker  . Smokeless tobacco: Never Used  Substance Use Topics  . Alcohol use: No     Family History  Problem Relation Age of Onset  . Asthma Mother   . COPD Mother   . Cancer Father   . Diabetes Father   . Heart disease Father   . Migraines Brother   . Asthma Daughter   . Healthy Son     ROS Per hpi  OBJECTIVE:  Blood pressure 124/78, pulse 77, temperature 98 F (36.7 C), temperature source Oral, resp. rate 16, height 5\' 3"  (1.6 m), weight 223 lb (101.2 kg), last menstrual period 12/29/2017, SpO2 97 %, unknown if currently breastfeeding. Body mass index is 39.5 kg/m.   Physical Exam  Constitutional: She is oriented to person, place, and time. She appears well-developed and well-nourished.  HENT:  Head: Normocephalic and atraumatic.  Mouth/Throat: Mucous membranes are normal.  Eyes: Pupils are equal, round, and reactive to light. Conjunctivae and EOM are normal. No scleral icterus.  Neck: Neck supple.  Pulmonary/Chest: Effort normal.  Neurological: She is alert and oriented to person, place, and time.  Skin: Skin is warm and dry.  Psychiatric: She has a normal mood and affect.  Nursing note and vitals reviewed.   ASSESSMENT and PLAN  1. Mood disorder [facility]) Patient with MDQ9 and symptoms very suggestive of a mood disorder other than depression and anxiety. More than 50% of this 25 minute visit was spent discussing ddx, treatment options, meds r/se/b. After lengthy discussion, patient would like to do trial valproic acid for mood stabilization. She would like to cont on lexapro for now. Recent CBC and CMP normal. Referred to psych for further diagnosis and treatment - Ambulatory referral to Psychiatry  Other orders - valproic acid (DEPAKENE) 250 MG capsule; Take 1 capsule (250 mg total) by mouth 2 (two) times daily.   Return in about 4 weeks (around 02/14/2018).    Myles Lipps, MD Primary Care at [facility] [address] [place], [place] [address] Ph.  [phone] Fax [phone]

## 2018-02-10 ENCOUNTER — Emergency Department (HOSPITAL_COMMUNITY): Payer: BLUE CROSS/BLUE SHIELD

## 2018-02-10 ENCOUNTER — Other Ambulatory Visit: Payer: Self-pay

## 2018-02-10 ENCOUNTER — Encounter (HOSPITAL_COMMUNITY): Payer: Self-pay | Admitting: Nurse Practitioner

## 2018-02-10 ENCOUNTER — Emergency Department (HOSPITAL_COMMUNITY)
Admission: EM | Admit: 2018-02-10 | Discharge: 2018-02-10 | Disposition: A | Discharge status: Home / Self Care | Payer: BLUE CROSS/BLUE SHIELD | Attending: Emergency Medicine | Admitting: Emergency Medicine

## 2018-02-10 DIAGNOSIS — N3001 Acute cystitis with hematuria: Secondary | ICD-10-CM | POA: Diagnosis not present

## 2018-02-10 DIAGNOSIS — Z87440 Personal history of urinary (tract) infections: Secondary | ICD-10-CM | POA: Insufficient documentation

## 2018-02-10 DIAGNOSIS — Z79899 Other long term (current) drug therapy: Secondary | ICD-10-CM | POA: Diagnosis not present

## 2018-02-10 DIAGNOSIS — E039 Hypothyroidism, unspecified: Secondary | ICD-10-CM | POA: Insufficient documentation

## 2018-02-10 DIAGNOSIS — F1290 Cannabis use, unspecified, uncomplicated: Secondary | ICD-10-CM | POA: Diagnosis not present

## 2018-02-10 DIAGNOSIS — K0889 Other specified disorders of teeth and supporting structures: Secondary | ICD-10-CM | POA: Insufficient documentation

## 2018-02-10 DIAGNOSIS — R300 Dysuria: Secondary | ICD-10-CM | POA: Diagnosis present

## 2018-02-10 DIAGNOSIS — R1033 Periumbilical pain: Secondary | ICD-10-CM | POA: Insufficient documentation

## 2018-02-10 DIAGNOSIS — N200 Calculus of kidney: Secondary | ICD-10-CM | POA: Diagnosis not present

## 2018-02-10 LAB — URINALYSIS, ROUTINE W REFLEX MICROSCOPIC
Bilirubin Urine: NEGATIVE
GLUCOSE, UA: NEGATIVE mg/dL
KETONES UR: NEGATIVE mg/dL
Nitrite: NEGATIVE
PROTEIN: NEGATIVE mg/dL
Specific Gravity, Urine: 1.016 (ref 1.005–1.030)
pH: 6 (ref 5.0–8.0)

## 2018-02-10 LAB — PREGNANCY, URINE: Preg Test, Ur: NEGATIVE

## 2018-02-10 MED ORDER — KETOROLAC TROMETHAMINE 30 MG/ML IJ SOLN
60.0000 mg | Freq: Once | INTRAMUSCULAR | Status: AC
  Administered 2018-02-10: 60 mg via INTRAMUSCULAR
  Filled 2018-02-10: qty 2

## 2018-02-10 MED ORDER — NITROFURANTOIN MONOHYD MACRO 100 MG PO CAPS: 100.0000 mg | ORAL_CAPSULE | Freq: Two times a day (BID) | ORAL | 0 refills | Status: DC

## 2018-02-10 MED ORDER — FLUCONAZOLE 150 MG PO TABS: 150.0000 mg | ORAL_TABLET | Freq: Once | ORAL | 0 refills | Status: AC

## 2018-02-10 MED ORDER — AMOXICILLIN 500 MG PO CAPS: 500.0000 mg | ORAL_CAPSULE | Freq: Three times a day (TID) | ORAL | 0 refills | Status: DC

## 2018-02-10 MED ORDER — IBUPROFEN 600 MG PO TABS: 600.0000 mg | ORAL_TABLET | Freq: Four times a day (QID) | ORAL | 0 refills | Status: DC | PRN

## 2018-02-10 NOTE — ED Provider Notes (Signed)
[facility]   CSN: [id number] Arrival date & time: 02/10/18  0112     History   Chief Complaint Chief Complaint  Patient presents with  . Dysuria  . Jaw Pain    HPI Faith Beck is a 27 y.o. female.   27 year old female with a history of hypothyroid, kidney stones, depression, asthma presents to the ED for multiple complaints.  She notes 3 days of constant, sharp suprapubic abdominal discomfort with dysuria and urinary frequency.  She has not taken any medications for her symptoms and denies any hematuria, nausea, vomiting, fevers.  No significant back pain.  She has never required surgical intervention for management of kidney stones.  Abdominal surgical history significant for cholecystectomy, tubal ligation, and cesarean section.  Patient also noting ongoing left lower dentalgia.  States that she had a filling fall out and has felt arts of her tooth disintegrate since this time.  She describes a discomfort in her jaw which is intermittent.  Denies taking any medications for symptoms.  Has not been able to follow-up with a dentist due to lack of dental coverage.  Denies any drainage in the mouth, fevers, dental trauma.     Past Medical History:  Diagnosis Date  . Asthma   . Depression   . Hypothyroid   . Kidney stones     Patient Active Problem List   Diagnosis Date Noted  . Mild intermittent asthma with acute exacerbation 01/10/2018  . Anxiety and depression 01/10/2018  . Kidney stones 01/10/2018    Past Surgical History:  Procedure Laterality Date  . CESAREAN SECTION    . CHOLECYSTECTOMY N/A 02/19/2015   Procedure: LAPAROSCOPIC CHOLECYSTECTOMY WITH INTRAOPERATIVE CHOLANGIOGRAM;  Surgeon: Darnell Level, MD;  Location: [facility];  Service: General;  Laterality: N/A;  . DILATION AND CURETTAGE OF UTERUS    . TUBAL LIGATION       OB History    Gravida  3   Para      Term      Preterm      AB      Living    2     SAB      TAB      Ectopic      Multiple      Live Births  2            Home Medications    Prior to Admission medications   Medication Sig Start Date End Date Taking? Authorizing Provider  albuterol (PROVENTIL HFA;VENTOLIN HFA) 108 (90 Base) MCG/ACT inhaler Inhale 2 puffs into the lungs every 6 (six) hours as needed for wheezing or shortness of breath. 10/17/17   Myles Lipps, MD  amoxicillin (AMOXIL) 500 MG capsule Take 1 capsule (500 mg total) by mouth 3 (three) times daily. 02/10/18   Antony Madura, PA-C  escitalopram (LEXAPRO) 10 MG tablet Take 1 tablet (10 mg total) by mouth daily. 01/10/18   Myles Lipps, MD  fluconazole (DIFLUCAN) 150 MG tablet Take 1 tablet (150 mg total) by mouth once for 1 dose. Take upon completion of antibiotics for signs of yeast infection. 02/10/18 02/10/18  Antony Madura, PA-C  ibuprofen (ADVIL,MOTRIN) 600 MG tablet Take 1 tablet (600 mg total) by mouth every 6 (six) hours as needed. 02/10/18   Antony Madura, PA-C  loratadine (CLARITIN) 10 MG tablet Take 10 mg by mouth daily.    [provider]  nitrofurantoin, macrocrystal-monohydrate, (MACROBID) 100 MG capsule Take 1 capsule (100  mg total) by mouth 2 (two) times daily. 02/10/18   Antony Madura, PA-C  ranitidine (ZANTAC) 150 MG tablet Take 1 tablet (150 mg total) by mouth 2 (two) times daily. 10/06/17   Myles Lipps, MD  tamsulosin (FLOMAX) 0.4 MG CAPS capsule Take 1 capsule (0.4 mg total) by mouth daily. 01/10/18   Myles Lipps, MD  valproic acid (DEPAKENE) 250 MG capsule Take 1 capsule (250 mg total) by mouth 2 (two) times daily. 01/17/18   Myles Lipps, MD    Family History Family History  Problem Relation Age of Onset  . Asthma Mother   . COPD Mother   . Cancer Father   . Diabetes Father   . Heart disease Father   . Migraines Brother   . Asthma Daughter   . Healthy Son     Social History Social History   Tobacco Use  . Smoking status: Never Smoker  .  Smokeless tobacco: Never Used  Substance Use Topics  . Alcohol use: No  . Drug use: Yes    Types: Marijuana     Allergies   Patient has no known allergies.   Review of Systems Review of Systems Ten systems reviewed and are negative for acute change, except as noted in the HPI.    Physical Exam Updated Vital Signs BP 96/60   Pulse 84   Temp 98.1 F (36.7 C) (Oral)   Resp 18   Ht 5\' 3"  (1.6 m)   Wt 98.4 kg   SpO2 97%   BMI 38.44 kg/m   Physical Exam  Constitutional: She is oriented to person, place, and time. She appears well-developed and well-nourished. No distress.  Nontoxic appearing and in NAD  HENT:  Head: Normocephalic and atraumatic.  Multiple areas of dental caries and decay.  No trismus.  Tolerating secretions without difficulty.  No gingival swelling or fluctuance.  Eyes: Conjunctivae and EOM are normal. No scleral icterus.  Neck: Normal range of motion.  No meningismus  Pulmonary/Chest: Effort normal. No respiratory distress.  Respirations even and unlabored  Abdominal: Soft. She exhibits no mass. There is tenderness. There is no guarding.  Suprapubic tenderness to palpation.  Abdomen soft, obese.  No peritoneal signs.  Musculoskeletal: Normal range of motion.  Neurological: She is alert and oriented to person, place, and time. She exhibits normal muscle tone. Coordination normal.  Skin: Skin is warm and dry. No rash noted. She is not diaphoretic. No erythema. No pallor.  Psychiatric: She has a normal mood and affect. Her behavior is normal.  Nursing note and vitals reviewed.    ED Treatments / Results  Labs (all labs ordered are listed, but only abnormal results are displayed) Labs Reviewed  URINALYSIS, ROUTINE W REFLEX MICROSCOPIC - Abnormal; Notable for the following components:      Result Value   Hgb urine dipstick MODERATE (*)    Leukocytes, UA SMALL (*)    RBC / HPF >50 (*)    Bacteria, UA MANY (*)    All other components within normal  limits  URINE CULTURE  PREGNANCY, URINE    EKG None  Radiology US Renal  Result Date: 02/10/2018 CLINICAL DATA:  Hematuria. Right-sided abdominal pain. History of stones. EXAM: RENAL / URINARY TRACT ULTRASOUND COMPLETE COMPARISON:  CT 01/05/2018 FINDINGS: Right Kidney: Length: 10.4 cm. Extrarenal pelvis configuration without calyceal distention or hydronephrosis. 4 mm calculus in the mid kidney. Renal echogenicity is normal. Left Kidney: Length: 13.1 cm. No hydronephrosis. 5 mm and 4  mm intrarenal calculus visualized. Additional nonobstructing stones on CT are not well seen sonographically. Renal echogenicity is normal. Bladder: Appears normal for degree of bladder distention. Both ureteral jets are documented. IMPRESSION: Bilateral intrarenal calculi without hydronephrosis. Electronically Signed   By: Narda Rutherford M.D.   On: 02/10/2018 05:32    Procedures Procedures (including critical care time)  Medications Ordered in ED Medications  ketorolac (TORADOL) 30 MG/ML injection 60 mg (60 mg Intramuscular Given 02/10/18 0437)     Initial Impression / Assessment and Plan / ED Course  I have reviewed the triage vital signs and the nursing notes.  Pertinent labs & imaging results that were available during my care of the patient were reviewed by me and considered in my medical decision making (see chart for details).     27 year old female presenting for 2 complaints.  She notes urinary frequency as well as dysuria over the past 3 days.  This has been associated with suprapubic abdominal discomfort.  Her urinalysis today is consistent with UTI.  She is also noted to have hematuria.  This is favored to represent cystitis with associated hematuria rather than obstructing kidney stone.  She does have a history of nephrolithiasis, but has no evidence to suggest ureterolithiasis on renal ultrasound.  No persistent back pain, nausea, vomiting.  Patient resting comfortably following Toradol.  Will  discharge with prescription for Macrobid.  Patient also noting dentalgia which has been ongoing, waxing and waning in severity.  She has no Secretary/administrator or dentist with whom she can follow-up.  No signs of dental abscess at this time.  No red flags or signs concerning for Ludwig's angina.  She will be continued on amoxicillin for management of dentalgia.  Resource guide given for dental services in the area.  Return precautions discussed and provided. Patient discharged in stable condition with no unaddressed concerns.   Final Clinical Impressions(s) / ED Diagnoses   Final diagnoses:  Acute cystitis with hematuria  Dentalgia    ED Discharge Orders         Ordered    amoxicillin (AMOXIL) 500 MG capsule  3 times daily     02/10/18 0538    nitrofurantoin, macrocrystal-monohydrate, (MACROBID) 100 MG capsule  2 times daily     02/10/18 0538    fluconazole (DIFLUCAN) 150 MG tablet   Once     02/10/18 0538    ibuprofen (ADVIL,MOTRIN) 600 MG tablet  Every 6 hours PRN     02/10/18 0538           Antony Madura, PA-C 02/10/18 0606    Dione Booze, MD 02/10/18 [phone]

## 2018-02-10 NOTE — Discharge Instructions (Addendum)
We recommend the use of Macrobid for treatment of urinary tract infection.  You have been prescribed amoxicillin to take for coverage of dental infection.  Take both of these antibiotics as prescribed until finished.  Take ibuprofen as prescribed for management of pain.  You have been given a tablet of Diflucan to use if she develops signs of the yeast infection.  You would benefit from close follow-up with a dentist.  Referred to the dental resource guide for providers in the area.  Follow-up with your primary care doctor to ensure resolution of your UTI.

## 2018-02-10 NOTE — ED Triage Notes (Signed)
Pt is c/o pain with urination, frequency and bladder pain radiating to the lower back. She is also c/o left jaw pain and pain with swallowing.

## 2018-02-12 ENCOUNTER — Emergency Department (HOSPITAL_COMMUNITY)
Admission: EM | Admit: 2018-02-12 | Discharge: 2018-02-12 | Disposition: A | Discharge status: Home / Self Care | Payer: BLUE CROSS/BLUE SHIELD | Attending: Emergency Medicine | Admitting: Emergency Medicine

## 2018-02-12 ENCOUNTER — Emergency Department (HOSPITAL_COMMUNITY): Payer: BLUE CROSS/BLUE SHIELD

## 2018-02-12 ENCOUNTER — Other Ambulatory Visit: Payer: Self-pay

## 2018-02-12 ENCOUNTER — Encounter (HOSPITAL_COMMUNITY): Payer: Self-pay | Admitting: *Deleted

## 2018-02-12 DIAGNOSIS — J4520 Mild intermittent asthma, uncomplicated: Secondary | ICD-10-CM | POA: Insufficient documentation

## 2018-02-12 DIAGNOSIS — N200 Calculus of kidney: Secondary | ICD-10-CM | POA: Diagnosis not present

## 2018-02-12 DIAGNOSIS — Z79899 Other long term (current) drug therapy: Secondary | ICD-10-CM | POA: Insufficient documentation

## 2018-02-12 DIAGNOSIS — E039 Hypothyroidism, unspecified: Secondary | ICD-10-CM | POA: Diagnosis not present

## 2018-02-12 DIAGNOSIS — R1084 Generalized abdominal pain: Secondary | ICD-10-CM | POA: Diagnosis not present

## 2018-02-12 DIAGNOSIS — R112 Nausea with vomiting, unspecified: Secondary | ICD-10-CM | POA: Diagnosis not present

## 2018-02-12 DIAGNOSIS — J45909 Unspecified asthma, uncomplicated: Secondary | ICD-10-CM | POA: Diagnosis not present

## 2018-02-12 DIAGNOSIS — N390 Urinary tract infection, site not specified: Secondary | ICD-10-CM

## 2018-02-12 DIAGNOSIS — R000 Tachycardia, unspecified: Secondary | ICD-10-CM | POA: Insufficient documentation

## 2018-02-12 LAB — HEPATIC FUNCTION PANEL
ALBUMIN: 4 g/dL (ref 3.5–5.0)
ALK PHOS: 66 U/L (ref 38–126)
ALT: 13 U/L (ref 0–44)
AST: 18 U/L (ref 15–41)
Bilirubin, Direct: 0.1 mg/dL (ref 0.0–0.2)
Indirect Bilirubin: 0.5 mg/dL (ref 0.3–0.9)
TOTAL PROTEIN: 7.4 g/dL (ref 6.5–8.1)
Total Bilirubin: 0.6 mg/dL (ref 0.3–1.2)

## 2018-02-12 LAB — URINE CULTURE

## 2018-02-12 LAB — I-STAT BETA HCG BLOOD, ED (MC, WL, AP ONLY): I-stat hCG, quantitative: <5 m[IU]/mL (ref <5)

## 2018-02-12 LAB — BASIC METABOLIC PANEL
Anion gap: 11 (ref 5–15)
BUN: 12 mg/dL (ref 6–20)
CHLORIDE: 106 mmol/L (ref 98–111)
CO2: 22 mmol/L (ref 22–32)
Calcium: 9.2 mg/dL (ref 8.9–10.3)
Creatinine, Ser: 0.89 mg/dL (ref 0.44–1.00)
GFR calc Af Amer: >60 mL/min (ref >60)
Glucose, Bld: 97 mg/dL (ref 70–99)
POTASSIUM: 3.4 mmol/L — AB (ref 3.5–5.1)
Sodium: 139 mmol/L (ref 135–145)

## 2018-02-12 LAB — CBC WITH DIFFERENTIAL/PLATELET
Basophils Absolute: 0 10*3/uL (ref 0.0–0.1)
Basophils Relative: 0 %
EOS PCT: 1 %
Eosinophils Absolute: 0.1 10*3/uL (ref 0.0–0.7)
HCT: 41.7 % (ref 36.0–46.0)
Hemoglobin: 13.9 g/dL (ref 12.0–15.0)
LYMPHS ABS: 0.7 10*3/uL (ref 0.7–4.0)
LYMPHS PCT: 9 %
MCH: 28.7 pg (ref 26.0–34.0)
MCHC: 33.3 g/dL (ref 30.0–36.0)
MCV: 86 fL (ref 78.0–100.0)
MONO ABS: 0.7 10*3/uL (ref 0.1–1.0)
Monocytes Relative: 10 %
NEUTROS ABS: 5.9 10*3/uL (ref 1.7–7.7)
NEUTROS PCT: 80 %
PLATELETS: 216 10*3/uL (ref 150–400)
RBC: 4.85 MIL/uL (ref 3.87–5.11)
RDW: 14.3 % (ref 11.5–15.5)
WBC: 7.4 10*3/uL (ref 4.0–10.5)

## 2018-02-12 LAB — URINALYSIS, ROUTINE W REFLEX MICROSCOPIC
Bilirubin Urine: NEGATIVE
GLUCOSE, UA: NEGATIVE mg/dL
HGB URINE DIPSTICK: NEGATIVE
Ketones, ur: NEGATIVE mg/dL
Leukocytes, UA: NEGATIVE
Nitrite: NEGATIVE
Protein, ur: NEGATIVE mg/dL
SPECIFIC GRAVITY, URINE: 1.012 (ref 1.005–1.030)
pH: 9 — ABNORMAL HIGH (ref 5.0–8.0)

## 2018-02-12 LAB — I-STAT CG4 LACTIC ACID, ED: Lactic Acid, Venous: 1.32 mmol/L (ref 0.5–1.9)

## 2018-02-12 MED ORDER — SODIUM CHLORIDE 0.9 % IV SOLN
1.0000 g | Freq: Once | INTRAVENOUS | Status: AC
  Administered 2018-02-12: 1 g via INTRAVENOUS
  Filled 2018-02-12: qty 10

## 2018-02-12 MED ORDER — ONDANSETRON HCL 4 MG/2ML IJ SOLN
4.0000 mg | Freq: Once | INTRAMUSCULAR | Status: AC
  Administered 2018-02-12: 4 mg via INTRAVENOUS
  Filled 2018-02-12: qty 2

## 2018-02-12 MED ORDER — ONDANSETRON 4 MG PO TBDP: 4.0000 mg | ORAL_TABLET | Freq: Three times a day (TID) | ORAL | 0 refills | Status: DC | PRN

## 2018-02-12 MED ORDER — SODIUM CHLORIDE 0.9 % IV BOLUS
1000.0000 mL | Freq: Once | INTRAVENOUS | Status: AC
  Administered 2018-02-12: 1000 mL via INTRAVENOUS

## 2018-02-12 MED ORDER — CEPHALEXIN 500 MG PO CAPS: 500.0000 mg | ORAL_CAPSULE | Freq: Four times a day (QID) | ORAL | 0 refills | Status: DC

## 2018-02-12 MED ORDER — KETOROLAC TROMETHAMINE 15 MG/ML IJ SOLN
15.0000 mg | Freq: Once | INTRAMUSCULAR | Status: AC
  Administered 2018-02-12: 15 mg via INTRAVENOUS
  Filled 2018-02-12: qty 1

## 2018-02-12 MED ORDER — OMEPRAZOLE 20 MG PO CPDR: 20.0000 mg | DELAYED_RELEASE_CAPSULE | Freq: Every day | ORAL | 0 refills | Status: DC

## 2018-02-12 NOTE — ED Provider Notes (Signed)
[facility]-EMERGENCY DEPT Provider Note   CSN: [id number] Arrival date & time: 02/12/18  2026     History   Chief Complaint Chief Complaint  Patient presents with  . Generalized Body Aches    HPI Faith Beck is a 27 y.o. female.  The history is provided by the patient. No language interpreter was used.   Faith Beck is a 27 y.o. female who presents to the Emergency Department complaining of body aches. She presents to the emergency department for evaluation of body aches that began around noon today. She has generalized body aches with fever to 102. She had one episode of emesis this morning. She has associated nausea. She denies any cough, sore throat. She does have generalized abdominal pain as well as pain in her low back. She was seen in the emergency department two days ago and was diagnosed with urinary tract infection. She was not able to fill her antibiotics until last night. She took two doses of both Macrobid and amoxicillin. Past Medical History:  Diagnosis Date  . Asthma   . Depression   . Hypothyroid   . Kidney stones     Patient Active Problem List   Diagnosis Date Noted  . Mild intermittent asthma with acute exacerbation 01/10/2018  . Anxiety and depression 01/10/2018  . Kidney stones 01/10/2018    Past Surgical History:  Procedure Laterality Date  . CESAREAN SECTION    . CHOLECYSTECTOMY N/A 02/19/2015   Procedure: LAPAROSCOPIC CHOLECYSTECTOMY WITH INTRAOPERATIVE CHOLANGIOGRAM;  Surgeon: Darnell Level, MD;  Location: [facility];  Service: General;  Laterality: N/A;  . DILATION AND CURETTAGE OF UTERUS    . TUBAL LIGATION       OB History    Gravida  3   Para      Term      Preterm      AB      Living  2     SAB      TAB      Ectopic      Multiple      Live Births  2            Home Medications    Prior to Admission medications   Medication Sig Start Date End Date Taking? Authorizing Provider    albuterol (PROVENTIL HFA;VENTOLIN HFA) 108 (90 Base) MCG/ACT inhaler Inhale 2 puffs into the lungs every 6 (six) hours as needed for wheezing or shortness of breath. 10/17/17  Yes Myles Lipps, MD  amoxicillin (AMOXIL) 500 MG capsule Take 1 capsule (500 mg total) by mouth 3 (three) times daily. 02/10/18  Yes Antony Madura, PA-C  escitalopram (LEXAPRO) 10 MG tablet Take 1 tablet (10 mg total) by mouth daily. 01/10/18  Yes Myles Lipps, MD  ibuprofen (ADVIL,MOTRIN) 600 MG tablet Take 1 tablet (600 mg total) by mouth every 6 (six) hours as needed. 02/10/18  Yes Antony Madura, PA-C  loratadine (CLARITIN) 10 MG tablet Take 10 mg by mouth daily.   Yes [provider]  nitrofurantoin, macrocrystal-monohydrate, (MACROBID) 100 MG capsule Take 1 capsule (100 mg total) by mouth 2 (two) times daily. 02/10/18  Yes Antony Madura, PA-C  ranitidine (ZANTAC) 150 MG tablet Take 1 tablet (150 mg total) by mouth 2 (two) times daily. 10/06/17  Yes Myles Lipps, MD  valproic acid (DEPAKENE) 250 MG capsule Take 1 capsule (250 mg total) by mouth 2 (two) times daily. 01/17/18  Yes Myles Lipps, MD  cephALEXin (  KEFLEX) 500 MG capsule Take 1 capsule (500 mg total) by mouth 4 (four) times daily. 02/12/18   Tilden Fossa, MD  omeprazole (PRILOSEC) 20 MG capsule Take 1 capsule (20 mg total) by mouth daily. 02/12/18   Tilden Fossa, MD  ondansetron (ZOFRAN ODT) 4 MG disintegrating tablet Take 1 tablet (4 mg total) by mouth every 8 (eight) hours as needed for nausea or vomiting. 02/12/18   Tilden Fossa, MD  tamsulosin (FLOMAX) 0.4 MG CAPS capsule Take 1 capsule (0.4 mg total) by mouth daily. Patient not taking: Reported on 02/12/2018 01/10/18   Myles Lipps, MD    Family History Family History  Problem Relation Age of Onset  . Asthma Mother   . COPD Mother   . Cancer Father   . Diabetes Father   . Heart disease Father   . Migraines Brother   . Asthma Daughter   . Healthy Son     Social  History Social History   Tobacco Use  . Smoking status: Never Smoker  . Smokeless tobacco: Never Used  Substance Use Topics  . Alcohol use: No  . Drug use: Yes    Types: Marijuana     Allergies   Patient has no known allergies.   Review of Systems Review of Systems  All other systems reviewed and are negative.    Physical Exam Updated Vital Signs BP 114/83 (BP Location: Left Arm)   Pulse (!) 106   Temp 98.8 F (37.1 C) (Oral)   Resp 16   LMP 01/23/2018   SpO2 100%   Physical Exam  Constitutional: She is oriented to person, place, and time. She appears well-developed and well-nourished.  HENT:  Head: Normocephalic and atraumatic.  Cardiovascular: Regular rhythm.  No murmur heard. tachycardic  Pulmonary/Chest: Effort normal and breath sounds normal. No respiratory distress.  Abdominal: Soft. There is no rebound and no guarding.  Mild generalized abdominal tenderness  Musculoskeletal: She exhibits no edema or tenderness.  Neurological: She is alert and oriented to person, place, and time.  Skin: Skin is warm and dry.  Psychiatric: She has a normal mood and affect. Her behavior is normal.  Nursing note and vitals reviewed.    ED Treatments / Results  Labs (all labs ordered are listed, but only abnormal results are displayed) Labs Reviewed  BASIC METABOLIC PANEL - Abnormal; Notable for the following components:      Result Value   Potassium 3.4 (*)    All other components within normal limits  URINALYSIS, ROUTINE W REFLEX MICROSCOPIC - Abnormal; Notable for the following components:   Color, Urine STRAW (*)    pH 9.0 (*)    All other components within normal limits  CBC WITH DIFFERENTIAL/PLATELET  HEPATIC FUNCTION PANEL  I-STAT BETA HCG BLOOD, ED ([facility], [facility], [facility] ONLY)  I-STAT CG4 LACTIC ACID, ED  I-STAT CG4 LACTIC ACID, ED    EKG None  Radiology Ct Renal Stone Study  Result Date: 02/12/2018 CLINICAL DATA:  History of stones. Fever currently being  treated for urinary tract infection. Flank pain. EXAM: CT ABDOMEN AND PELVIS WITHOUT CONTRAST TECHNIQUE: Multidetector CT imaging of the abdomen and pelvis was performed following the standard protocol without IV contrast. COMPARISON:  01/05/2018 FINDINGS: Lower chest: Small hiatal hernia. Clear lung bases. The included heart size is normal. Hepatobiliary: No focal liver abnormality is seen. Status post cholecystectomy. No biliary dilatation. Pancreas: Unremarkable. No pancreatic ductal dilatation or surrounding inflammatory changes. Spleen: Normal in size without focal abnormality. Adrenals/Urinary Tract:  Normal bilateral adrenal glands. Tiny intrarenal calculi without obstruction. No hydroureteronephrosis. The urinary bladder is decompressed without focal mural thickening or calculus. Stomach/Bowel: Mild thickening of the distal esophagus similar to prior query esophagitis. The stomach, small intestine and colon are unremarkable. Normal appendix. Vascular/Lymphatic: No significant vascular findings are present. Stable 13 mm short axis paraesophageal lymph node of indeterminate clinical significance. No enlarged abdominal or pelvic lymph nodes. Reproductive: Uterus and bilateral adnexa are unremarkable. Other: No abdominal wall hernia or abnormality. No abdominopelvic ascites. Musculoskeletal: No acute or significant osseous findings. IMPRESSION: 1. Bilateral nephrolithiasis without obstructive uropathy. 2. Small hiatal hernia. 3. Slight thickening of the included distal esophagus with stable mildly enlarged paraesophageal lymph node measuring 13 mm short axis. Question esophageal inflammation. Electronically Signed   By: Tollie Eth M.D.   On: 02/12/2018 22:29    Procedures Procedures (including critical care time)  Medications Ordered in ED Medications  sodium chloride 0.9 % bolus 1,000 mL (1,000 mLs Intravenous New Bag/Given 02/12/18 2212)  ketorolac (TORADOL) 15 MG/ML injection 15 mg (has no  administration in time range)  cefTRIAXone (ROCEPHIN) 1 g in sodium chloride 0.9 % 100 mL IVPB (1 g Intravenous New Bag/Given 02/12/18 2212)  ondansetron (ZOFRAN) injection 4 mg (4 mg Intravenous Given 02/12/18 2214)     Initial Impression / Assessment and Plan / ED Course  I have reviewed the triage vital signs and the nursing notes.  Pertinent labs & imaging results that were available during my care of the patient were reviewed by me and considered in my medical decision making (see chart for details).     Patient here for evaluation of body aches, nausea, malaise that started today. She had fever prior to ED arrival. Records reviewed from recent ED visit. Urine culture grew out Klebsiella pneumonia, resistant to penicillin as well as Macrobid. CT abdomen obtained to rule out obstructing stone as well. CT is negative for obstructing stone, appendicitis. No evidence of sepsis based on labs and presentation today. Urine today does not look like infection but given her symptoms in recent culture, will change her antibiotics into Keflex. Discussed with patient home care for urinary tract infection. Discussed close outpatient follow-up as well as return precautions.  Final Clinical Impressions(s) / ED Diagnoses   Final diagnoses:  Acute UTI    ED Discharge Orders         Ordered    cephALEXin (KEFLEX) 500 MG capsule  4 times daily     02/12/18 2249    ondansetron (ZOFRAN ODT) 4 MG disintegrating tablet  Every 8 hours PRN     02/12/18 2249    omeprazole (PRILOSEC) 20 MG capsule  Daily     02/12/18 2249           Tilden Fossa, MD 02/12/18 2251

## 2018-02-12 NOTE — ED Notes (Addendum)
Pt very tearful while being placed in a triage room and while trying to obtain pt vital signs.  Pt ask if she was ok, pt sts that her "whole body hurts."  RN notified.

## 2018-02-12 NOTE — ED Triage Notes (Signed)
Pt reports since about 1 pm today she has had body aches and fevers. Last ibuprofen around 1800. She was seen on 02/10/18 and prescribed antibiotics for urinary and dental symptoms, reports she is taking the medications as prescribed.

## 2018-02-13 ENCOUNTER — Telehealth: Payer: Self-pay | Admitting: Emergency Medicine

## 2018-02-13 NOTE — Telephone Encounter (Signed)
Post ED Visit - Positive Culture Follow-up  Culture report reviewed by antimicrobial stewardship pharmacist:  []  Enzo Bi, Pharm.D. []  Celedonio Miyamoto, Pharm.D., BCPS AQ-ID []  Garvin Fila, Pharm.D., BCPS []  Georgina Pillion, Pharm.D., BCPS []  [place], [place].D., BCPS, AAHIVP []  Estella Husk, Pharm.D., BCPS, AAHIVP []  Lysle Pearl, PharmD, BCPS []  Phillips Climes, PharmD, BCPS []  Agapito Games, PharmD, BCPS []  Verlan Friends, PharmD  Positive urine culture Treated with cephalexin, organism sensitive to the same and no further patient follow-up is required at this time.  Berle Mull 02/13/2018, 11:15 AM

## 2018-02-13 NOTE — Progress Notes (Addendum)
ED Antimicrobial Stewardship Positive Culture Follow Up   Faith Beck is an 27 y.o. female who presented to [facility] on 02/10/18 with complaints of suprapubic pain, dysuria, frequency, and dental pain. Patient discharged on 10/5 with prescription for Macrobid 100 mg BID for UTI and amoxicillin 500 mg TID for dentalgia. Pt returned on 02/12/18 with worsened symptoms. Per history and culture results, antibiotic changed to cephalexin 500 mg PO QID x 7 days.  Chief Complaint  Patient presents with  . Generalized Body Aches    Recent Results (from the past 720 hour(s))  Urine culture     Status: Abnormal   Collection Time: 02/10/18  1:46 AM  Result Value Ref Range Status   Specimen Description   Final    URINE, RANDOM Performed at [facility], [address], [place], [place] [address]    Special Requests   Final    NONE Performed at [facility], [address], [place], [place] [address]    Culture >=100,000 COLONIES/mL KLEBSIELLA PNEUMONIAE (A)  Final   Report Status 02/12/2018 FINAL  Final   Organism ID, Bacteria KLEBSIELLA PNEUMONIAE (A)  Final      Susceptibility   Klebsiella pneumoniae - MIC*    AMPICILLIN RESISTANT Resistant     CEFAZOLIN <=4 SENSITIVE Sensitive     CEFTRIAXONE <=1 SENSITIVE Sensitive     CIPROFLOXACIN <=0.25 SENSITIVE Sensitive     GENTAMICIN <=1 SENSITIVE Sensitive     IMIPENEM <=0.25 SENSITIVE Sensitive     NITROFURANTOIN 64 INTERMEDIATE Intermediate     TRIMETH/SULFA <=20 SENSITIVE Sensitive     AMPICILLIN/SULBACTAM <=2 SENSITIVE Sensitive     PIP/TAZO <=4 SENSITIVE Sensitive     Extended ESBL NEGATIVE Sensitive     * >=100,000 COLONIES/mL KLEBSIELLA PNEUMONIAE   No further change needed today.   ED Provider: Harlene Salts PA-C   [name] 02/13/2018, 10:28 AM Pharmacy Student Monday - Friday phone -  [phone] Saturday - Sunday phone - [phone]

## 2018-02-21 ENCOUNTER — Ambulatory Visit (INDEPENDENT_AMBULATORY_CARE_PROVIDER_SITE_OTHER): Payer: BLUE CROSS/BLUE SHIELD | Admitting: Gastroenterology

## 2018-02-21 ENCOUNTER — Encounter: Payer: Self-pay | Admitting: Gastroenterology

## 2018-02-21 VITALS — BP 100/64 | HR 72 | Ht 63.0 in | Wt 218.0 lb

## 2018-02-21 DIAGNOSIS — R1310 Dysphagia, unspecified: Secondary | ICD-10-CM

## 2018-02-21 DIAGNOSIS — K219 Gastro-esophageal reflux disease without esophagitis: Secondary | ICD-10-CM | POA: Diagnosis not present

## 2018-02-21 NOTE — Progress Notes (Signed)
HPI: This is a very pleasant 27 year old woman who was referred to me by Myles Lipps, MD  to evaluate chronic GERD, dysphasia.    Chief complaint is GERD, dysphasia  Heartburn for 7 years. Often at bedtime. Mild dysphagia to solids, will need to drink water.  Sometimes will have to vomit.  Has been a year, getting worse.   Overall she's lost 30 pounds because eating just about anything causes pains or GERD. If even water can be an issue.  Takes zantac; switches PPIs and H2 blocker.  She has never really found that any nonprescription antiacid medicines seem to help her very much  Lately 150 mg zantac twice daily, the last one is generally at bedtime   Old Data Reviewed: CT scan abd, pelvis with IV and oral constrast 01/2017: Small esophageal hiatal hernia. Prominent lymph node adjacent to the distal esophagus measures 1.4 cm in diameter. This was present on the previous studies without interval change. CT scan renal stone study (without IV contrast) October 2019 : slight thickening of the included distal esophagus with stablemildly enlarged paraesophageal lymph node measuring 13 mm short axis. Question esophageal inflammation.  Labs 02/2018: CBC normal, basic metabolic profile normal except for slightly low potassium at 3.4, urinalysis negative however urine culture showed greater than 100,000 colonies of Klebsiella.  She was put on cephalexin   Review of systems: Pertinent positive and negative review of systems were noted in the above HPI section. All other review negative.   Past Medical History:  Diagnosis Date  . Asthma   . Depression   . Hypothyroid   . Kidney stones     Past Surgical History:  Procedure Laterality Date  . CESAREAN SECTION    . CHOLECYSTECTOMY N/A 02/19/2015   Procedure: LAPAROSCOPIC CHOLECYSTECTOMY WITH INTRAOPERATIVE CHOLANGIOGRAM;  Surgeon: Darnell Level, MD;  Location: [facility];  Service: General;  Laterality: N/A;  . DILATION AND CURETTAGE OF UTERUS     . TUBAL LIGATION      Current Outpatient Medications  Medication Sig Dispense Refill  . albuterol (PROVENTIL HFA;VENTOLIN HFA) 108 (90 Base) MCG/ACT inhaler Inhale 2 puffs into the lungs every 6 (six) hours as needed for wheezing or shortness of breath. 1 Inhaler 3  . escitalopram (LEXAPRO) 10 MG tablet Take 1 tablet (10 mg total) by mouth daily. 30 tablet 0  . loratadine (CLARITIN) 10 MG tablet Take 10 mg by mouth daily.    . ranitidine (ZANTAC) 150 MG tablet Take 1 tablet (150 mg total) by mouth 2 (two) times daily. 60 tablet 5  . valproic acid (DEPAKENE) 250 MG capsule Take 1 capsule (250 mg total) by mouth 2 (two) times daily. 60 capsule 2   No current facility-administered medications for this visit.     Allergies as of 02/21/2018  . (No Known Allergies)    Family History  Problem Relation Age of Onset  . Asthma Mother   . COPD Mother   . Cancer Father   . Diabetes Father   . Heart disease Father   . Migraines Brother   . Asthma Daughter   . Healthy Son     Social History   Socioeconomic History  . Marital status: Married    Spouse name: Not on file  . Number of children: Not on file  . Years of education: Not on file  . Highest education level: Not on file  Occupational History  . Not on file  Social Needs  . Financial resource strain: Not on  file  . Food insecurity:    Worry: Not on file    Inability: Not on file  . Transportation needs:    Medical: Not on file    Non-medical: Not on file  Tobacco Use  . Smoking status: Never Smoker  . Smokeless tobacco: Never Used  Substance and Sexual Activity  . Alcohol use: No  . Drug use: Yes    Types: Marijuana  . Sexual activity: Yes    Birth control/protection: None  Lifestyle  . Physical activity:    Days per week: Not on file    Minutes per session: Not on file  . Stress: Not on file  Relationships  . Social connections:    Talks on phone: Not on file    Gets together: Not on file    Attends  religious service: Not on file    Active member of club or organization: Not on file    Attends meetings of clubs or organizations: Not on file    Relationship status: Not on file  . Intimate partner violence:    Fear of current or ex partner: Not on file    Emotionally abused: Not on file    Physically abused: Not on file    Forced sexual activity: Not on file  Other Topics Concern  . Not on file  Social History Narrative  . Not on file     Physical Exam: BP 100/64   Pulse 72   Ht 5\' 3"  (1.6 m)   Wt 218 lb (98.9 kg)   LMP 01/23/2018   BMI 38.62 kg/m  Constitutional: generally well-appearing Psychiatric: alert and oriented x3 Eyes: extraocular movements intact Mouth: oral pharynx moist, no lesions Neck: supple no lymphadenopathy Cardiovascular: heart regular rate and rhythm Lungs: clear to auscultation bilaterally Abdomen: soft, nontender, nondistended, no obvious ascites, no peritoneal signs, normal bowel sounds Extremities: no lower extremity edema bilaterally Skin: no lesions on visible extremities   Assessment and plan: 27 y.o. female with chronic GERD, dysphasia, abnormal esophagus on the CT scan  She has lost about 30 pounds recently.  Overall this does raise some concern for underlying neoplasm and I recommended upper endoscopy to her.  She will also change her antiacid regimen so that she is taking prescription omeprazole 40 mg shortly before breakfast and over-the-counter ranitidine 150 mg at bedtime nightly.  I see no reason for any further blood tests or imaging studies at this time.    Please see the "Patient Instructions" section for addition details about the plan.   Rob Bunting, MD [facility] 02/21/2018, 9:31 AM  Cc: Myles Lipps, MD

## 2018-02-21 NOTE — Patient Instructions (Addendum)
Omeprazole 40mg  pills, one pill 20-30 min before breakfast meal daily (disp 30 with 5 refills) Take your zantac 150mg  pills, one pill at bedtime nightly. You will be set up for an upper endoscopy for GERD, dysphagia.  Thank you for entrusting me with your care and choosing [facility].  Dr Christella Hartigan

## 2018-02-22 ENCOUNTER — Encounter: Payer: BLUE CROSS/BLUE SHIELD | Admitting: Gastroenterology

## 2018-03-07 ENCOUNTER — Ambulatory Visit (AMBULATORY_SURGERY_CENTER): Payer: BLUE CROSS/BLUE SHIELD | Admitting: Gastroenterology

## 2018-03-07 ENCOUNTER — Encounter: Payer: Self-pay | Admitting: Gastroenterology

## 2018-03-07 VITALS — BP 108/53 | HR 85 | Temp 97.5°F | Resp 17 | Ht 63.0 in | Wt 218.0 lb

## 2018-03-07 DIAGNOSIS — K209 Esophagitis, unspecified without bleeding: Secondary | ICD-10-CM

## 2018-03-07 DIAGNOSIS — K219 Gastro-esophageal reflux disease without esophagitis: Secondary | ICD-10-CM | POA: Diagnosis not present

## 2018-03-07 DIAGNOSIS — K2950 Unspecified chronic gastritis without bleeding: Secondary | ICD-10-CM | POA: Diagnosis not present

## 2018-03-07 DIAGNOSIS — K2990 Gastroduodenitis, unspecified, without bleeding: Secondary | ICD-10-CM

## 2018-03-07 DIAGNOSIS — K2210 Ulcer of esophagus without bleeding: Secondary | ICD-10-CM | POA: Diagnosis not present

## 2018-03-07 DIAGNOSIS — K210 Gastro-esophageal reflux disease with esophagitis: Secondary | ICD-10-CM | POA: Diagnosis not present

## 2018-03-07 DIAGNOSIS — K2970 Gastritis, unspecified, without bleeding: Secondary | ICD-10-CM

## 2018-03-07 MED ORDER — SODIUM CHLORIDE 0.9 % IV SOLN: 500.0000 mL | INTRAVENOUS | Status: DC

## 2018-03-07 MED ORDER — OMEPRAZOLE 40 MG PO CPDR: 40.0000 mg | DELAYED_RELEASE_CAPSULE | Freq: Two times a day (BID) | ORAL | 5 refills | Status: DC

## 2018-03-07 NOTE — Patient Instructions (Signed)
Handouts:  Gastritis  YOU HAD AN ENDOSCOPIC PROCEDURE TODAY AT THE [facility]:   Refer to the procedure report that was given to you for any specific questions about what was found during the examination.  If the procedure report does not answer your questions, please call your gastroenterologist to clarify.  If you requested that your care partner not be given the details of your procedure findings, then the procedure report has been included in a sealed envelope for you to review at your convenience later.  YOU SHOULD EXPECT: Some feelings of bloating in the abdomen. Passage of more gas than usual.  Walking can help get rid of the air that was put into your GI tract during the procedure and reduce the bloating. If you had a lower endoscopy (such as a colonoscopy or flexible sigmoidoscopy) you may notice spotting of blood in your stool or on the toilet paper. If you underwent a bowel prep for your procedure, you may not have a normal bowel movement for a few days.  Please Note:  You might notice some irritation and congestion in your nose or some drainage.  This is from the oxygen used during your procedure.  There is no need for concern and it should clear up in a day or so.  SYMPTOMS TO REPORT IMMEDIATELY:   Following upper endoscopy (EGD)  Vomiting of blood or coffee ground material  New chest pain or pain under the shoulder blades  Painful or persistently difficult swallowing  New shortness of breath  Fever of 100F or higher  Black, tarry-looking stools  For urgent or emergent issues, a gastroenterologist can be reached at any hour by calling [phone].   DIET:  We do recommend a small meal at first, but then you may proceed to your regular diet.  Drink plenty of fluids but you should avoid alcoholic beverages for 24 hours.  ACTIVITY:  You should plan to take it easy for the rest of today and you should NOT DRIVE or use heavy machinery until tomorrow (because of the  sedation medicines used during the test).    FOLLOW UP: Our staff will call the number listed on your records the next business day following your procedure to check on you and address any questions or concerns that you may have regarding the information given to you following your procedure. If we do not reach you, we will leave a message.  However, if you are feeling well and you are not experiencing any problems, there is no need to return our call.  We will assume that you have returned to your regular daily activities without incident.  If any biopsies were taken you will be contacted by phone or by letter within the next 1-3 weeks.  Please call us at [phone] if you have not heard about the biopsies in 3 weeks.    SIGNATURES/CONFIDENTIALITY: You and/or your care partner have signed paperwork which will be entered into your electronic medical record.  These signatures attest to the fact that that the information above on your After Visit Summary has been reviewed and is understood.  Full responsibility of the confidentiality of this discharge information lies with you and/or your care-partner. 

## 2018-03-07 NOTE — Progress Notes (Signed)
Called to room to assist during endoscopic procedure.  Patient ID and intended procedure confirmed with present staff. Received instructions for my participation in the procedure from the performing physician.  

## 2018-03-07 NOTE — Op Note (Signed)
[facility] Patient Name: Faith Beck Procedure Date: 03/07/2018 9:29 AM MRN: [id number] Endoscopist: Rachael Fee , MD Age: 27 Referring MD:  Date of Birth: 1990/09/20 Gender: Female Account #: [id number] Procedure:                Upper GI endoscopy Indications:              Dysphagia, Heartburn, Weight loss Medicines:                Monitored Anesthesia Care Procedure:                Pre-Anesthesia Assessment:                           - Prior to the procedure, a History and Physical                            was performed, and patient medications and                            allergies were reviewed. The patient's tolerance of                            previous anesthesia was also reviewed. The risks                            and benefits of the procedure and the sedation                            options and risks were discussed with the patient.                            All questions were answered, and informed consent                            was obtained. Prior Anticoagulants: The patient has                            taken no previous anticoagulant or antiplatelet                            agents. ASA Grade Assessment: II - A patient with                            mild systemic disease. After reviewing the risks                            and benefits, the patient was deemed in                            satisfactory condition to undergo the procedure.                           After obtaining informed consent, the endoscope was  passed under direct vision. Throughout the                            procedure, the patient's blood pressure, pulse, and                            oxygen saturations were monitored continuously. The                            Endoscope was introduced through the mouth, and                            advanced to the second part of duodenum. The upper                            GI endoscopy was  accomplished without difficulty.                            The patient tolerated the procedure well. Scope In: Scope Out: Findings:                 There was severe, reflux appearing esophagitis (LA                            Grade D).                           There was salmon colored mucosa proximal to to GE                            junction (would be C0M1 if confirmed to be                            Barrett's mucosa, biopsies taken).                           Small, clean based, shallow ulcer with abnormal                            surrounding mucosa just distal to the GE junction.                            No overtly neoplastic, biopsies taken.                           Moderate inflammation characterized by erythema,                            friability and granularity was found in the gastric                            antrum. Biopsies were taken with a cold forceps for                            histology.  The exam was otherwise without abnormality. Complications:            No immediate complications. Estimated blood loss:                            None. Estimated Blood Loss:     Estimated blood loss: none. Impression:               - Severe esophagitis from GERD.                           - Abnormal esophagus mucosa which may be Barrett's                            change vs. acid related inflammation (biopsied)                           - Small ulcer just distal to the GE junction                            (biospied).                           - Moderate gastritis (biopsied)                           - The examination was otherwise normal. Recommendation:           - Patient has a contact number available for                            emergencies. The signs and symptoms of potential                            delayed complications were discussed with the                            patient. Return to normal activities tomorrow.                             Written discharge instructions were provided to the                            patient.                           - Resume previous diet.                           - Continue present medications. Will send in new                            prescription fro TWICE DAILY omeprazole (40mg  pills                            one pill twice daily, disp 60, with 5 refills).  Please continue ranitidine (or pecid 20mg ) at                            bedtime every night for now.                           - Await pathology results. Rachael Fee, MD 03/07/2018 10:07:22 AM This report has been signed electronically.

## 2018-03-07 NOTE — Progress Notes (Signed)
Report given to PACU, vss 

## 2018-03-08 ENCOUNTER — Telehealth: Payer: Self-pay

## 2018-03-08 ENCOUNTER — Telehealth: Payer: Self-pay | Admitting: *Deleted

## 2018-03-08 NOTE — Telephone Encounter (Signed)
Attempted to reach pt. With follow-up call following endoscopic procedure 03/07/2018.  LM on pt. Voice mail.  Will try to reach pt. Again later today. 

## 2018-03-08 NOTE — Telephone Encounter (Signed)
  Follow up Call-  Call back number 03/07/2018  Post procedure Call Back phone  # [phone]  Permission to leave phone message Yes  Some recent data might be hidden     Patient questions:  Do you have a fever, pain , or abdominal swelling? No. Pain Score  0 *  Have you tolerated food without any problems? Yes.    Have you been able to return to your normal activities? Yes.    Do you have any questions about your discharge instructions: Diet   No. Medications  No. Follow up visit  No.  Do you have questions or concerns about your Care? No.  Actions: * If pain score is 4 or above: No action needed, pain <4.

## 2018-03-12 ENCOUNTER — Other Ambulatory Visit: Payer: Self-pay

## 2018-03-12 DIAGNOSIS — K2990 Gastroduodenitis, unspecified, without bleeding: Secondary | ICD-10-CM

## 2018-03-12 DIAGNOSIS — K219 Gastro-esophageal reflux disease without esophagitis: Secondary | ICD-10-CM

## 2018-03-12 DIAGNOSIS — K2970 Gastritis, unspecified, without bleeding: Secondary | ICD-10-CM

## 2018-03-14 ENCOUNTER — Other Ambulatory Visit (INDEPENDENT_AMBULATORY_CARE_PROVIDER_SITE_OTHER): Payer: BLUE CROSS/BLUE SHIELD

## 2018-03-14 DIAGNOSIS — K2990 Gastroduodenitis, unspecified, without bleeding: Secondary | ICD-10-CM

## 2018-03-14 DIAGNOSIS — K2970 Gastritis, unspecified, without bleeding: Secondary | ICD-10-CM | POA: Diagnosis not present

## 2018-03-14 DIAGNOSIS — K219 Gastro-esophageal reflux disease without esophagitis: Secondary | ICD-10-CM | POA: Diagnosis not present

## 2018-03-14 LAB — IGA: IgA: 140 mg/dL (ref 68–378)

## 2018-03-15 LAB — TISSUE TRANSGLUTAMINASE, IGA: (tTG) Ab, IgA: 1 U/mL

## 2018-04-24 ENCOUNTER — Ambulatory Visit (INDEPENDENT_AMBULATORY_CARE_PROVIDER_SITE_OTHER): Payer: BLUE CROSS/BLUE SHIELD | Admitting: Gastroenterology

## 2018-04-24 ENCOUNTER — Encounter: Payer: Self-pay | Admitting: Gastroenterology

## 2018-04-24 VITALS — BP 108/70 | HR 64 | Ht 63.0 in | Wt 217.5 lb

## 2018-04-24 DIAGNOSIS — K219 Gastro-esophageal reflux disease without esophagitis: Secondary | ICD-10-CM | POA: Diagnosis not present

## 2018-04-24 NOTE — Patient Instructions (Addendum)
Please return to see Dr. Christella HartiganJacobs in 3 months. You should change the way you are taking your antiacid medicine (omeprazole) so that you are taking it 20-30 minutes prior to a decent meal as that is the way the pill is designed to work most effectively. Continue taking it twice a day.  Stop ranitidine (zantac)  Start famotidine (pepcid) 20mg  pill, one pill at bedtime nightly.  Thank you for entrusting me with your care and choosing [facility].  Dr Christella HartiganJacobs

## 2018-04-24 NOTE — Progress Notes (Signed)
Review of pertinent gastrointestinal problems: 1. Severe reflux esophagitis: Presented with nausea, vomiting, weight loss, EGD October 2019, Dr. Christella Hartigan found severe reflux type esophagitis, suggestion of possible Barrett's.  Multiple biopsies were taken from her esophagus, the GE junction, the stomach.  She had no intestinal metaplasia, no H. pylori, just acid related damage.   Old Data Reviewed: CT scan abd, pelvis with IV and oral constrast 01/2017: Small esophageal hiatal hernia. Prominent lymph node adjacent to the distal esophagus measures 1.4 cm in diameter. This was present on the previous studies without interval change. CT scan renal stone study (without IV contrast) October 2019 : slight thickening of the included distal esophagus with stablemildly enlarged paraesophageal lymph node measuring 13 mm short axis. Question esophageal inflammation.  Labs 02/2018: CBC normal, basic metabolic profile normal except for slightly low potassium at 3.4, urinalysis negative however urine culture showed greater than 100,000 colonies of Klebsiella.  She was put on cephalexin   HPI: This is a very pleasant 27 year old woman whom I last saw the time of an upper endoscopy about 2 months ago  She has been taking proton pump inhibitor twice daily and H2 blocker at bedtime.  On that regimen she is "much better".  She still has intermittent burning in her chest and globus type sensation but is very clear that she feels much improved.    Chief complaint is esophagitis, heartburn  ROS: complete GI ROS as described in HPI, all other review negative.  Constitutional:  No unintentional weight loss   Past Medical History:  Diagnosis Date  . Asthma   . Depression   . Hypothyroid   . Kidney stones     Past Surgical History:  Procedure Laterality Date  . CESAREAN SECTION    . CHOLECYSTECTOMY N/A 02/19/2015   Procedure: LAPAROSCOPIC CHOLECYSTECTOMY WITH INTRAOPERATIVE CHOLANGIOGRAM;  Surgeon: Darnell Level, MD;  Location: [facility];  Service: General;  Laterality: N/A;  . DILATION AND CURETTAGE OF UTERUS    . TUBAL LIGATION      Current Outpatient Medications  Medication Sig Dispense Refill  . albuterol (PROVENTIL HFA;VENTOLIN HFA) 108 (90 Base) MCG/ACT inhaler Inhale 2 puffs into the lungs every 6 (six) hours as needed for wheezing or shortness of breath. 1 Inhaler 3  . escitalopram (LEXAPRO) 10 MG tablet Take 1 tablet (10 mg total) by mouth daily. 30 tablet 0  . loratadine (CLARITIN) 10 MG tablet Take 10 mg by mouth daily.    Marland Kitchen omeprazole (PRILOSEC) 40 MG capsule Take 1 capsule (40 mg total) by mouth 2 (two) times daily. 60 capsule 5  . ranitidine (ZANTAC) 150 MG tablet Take 1 tablet (150 mg total) by mouth 2 (two) times daily. 60 tablet 5  . valproic acid (DEPAKENE) 250 MG capsule Take 1 capsule (250 mg total) by mouth 2 (two) times daily. 60 capsule 2   No current facility-administered medications for this visit.     Allergies as of 04/24/2018  . (No Known Allergies)    Family History  Problem Relation Age of Onset  . Asthma Mother   . COPD Mother   . Cancer Father   . Diabetes Father   . Heart disease Father   . Pancreatic cancer Father   . Migraines Brother   . Asthma Daughter   . Healthy Son     Social History   Socioeconomic History  . Marital status: Married    Spouse name: Not on file  . Number of children: Not on file  .  Years of education: Not on file  . Highest education level: Not on file  Occupational History  . Not on file  Social Needs  . Financial resource strain: Not on file  . Food insecurity:    Worry: Not on file    Inability: Not on file  . Transportation needs:    Medical: Not on file    Non-medical: Not on file  Tobacco Use  . Smoking status: Never Smoker  . Smokeless tobacco: Never Used  Substance and Sexual Activity  . Alcohol use: No  . Drug use: Yes    Types: Marijuana  . Sexual activity: Yes    Birth control/protection: None   Lifestyle  . Physical activity:    Days per week: Not on file    Minutes per session: Not on file  . Stress: Not on file  Relationships  . Social connections:    Talks on phone: Not on file    Gets together: Not on file    Attends religious service: Not on file    Active member of club or organization: Not on file    Attends meetings of clubs or organizations: Not on file    Relationship status: Not on file  . Intimate partner violence:    Fear of current or ex partner: Not on file    Emotionally abused: Not on file    Physically abused: Not on file    Forced sexual activity: Not on file  Other Topics Concern  . Not on file  Social History Narrative  . Not on file     Physical Exam: BP 108/70   Pulse 64   Ht 5\' 3"  (1.6 m)   Wt 217 lb 8 oz (98.7 kg)   BMI 38.53 kg/m  Constitutional: generally well-appearing Psychiatric: alert and oriented x3 Abdomen: soft, nontender, nondistended, no obvious ascites, no peritoneal signs, normal bowel sounds No peripheral edema noted in lower extremities  Assessment and plan: 27 y.o. female with ulcerative esophagitis, heartburn  She feels much better on proton pump inhibitor twice daily and H2 blocker at bedtime.  She is not taking her proton pump inhibitor at the correct time in relation to meals and so I instructed her to change that a bit.  Her current H2 blocker may contain carcinogenic compounds and so I recommended she change to famotidine, Pepcid 20 mg pills.  She will continue to take that at bedtime once nightly.  I would like to see her back in 3 or 4 months, will consider tapering some of her antiacid medicines at that point.  Please see the "Patient Instructions" section for addition details about the plan.  Rob Buntinganiel Shamiyah Ngu, MD [facility] 04/24/2018, 10:05 AM

## 2018-05-23 ENCOUNTER — Encounter (HOSPITAL_COMMUNITY): Payer: Self-pay | Admitting: Emergency Medicine

## 2018-05-23 ENCOUNTER — Other Ambulatory Visit: Payer: Self-pay

## 2018-05-23 DIAGNOSIS — Z79899 Other long term (current) drug therapy: Secondary | ICD-10-CM | POA: Insufficient documentation

## 2018-05-23 DIAGNOSIS — J111 Influenza due to unidentified influenza virus with other respiratory manifestations: Secondary | ICD-10-CM | POA: Insufficient documentation

## 2018-05-23 DIAGNOSIS — E039 Hypothyroidism, unspecified: Secondary | ICD-10-CM | POA: Insufficient documentation

## 2018-05-23 DIAGNOSIS — J4520 Mild intermittent asthma, uncomplicated: Secondary | ICD-10-CM | POA: Insufficient documentation

## 2018-05-23 NOTE — ED Triage Notes (Signed)
Patient c/o cough, congestion, body aches, chills and sore throat x3 days.

## 2018-05-24 ENCOUNTER — Emergency Department (HOSPITAL_COMMUNITY)
Admission: EM | Admit: 2018-05-24 | Discharge: 2018-05-24 | Disposition: A | Discharge status: Home / Self Care | Payer: BLUE CROSS/BLUE SHIELD | Attending: Emergency Medicine | Admitting: Emergency Medicine

## 2018-05-24 DIAGNOSIS — R6889 Other general symptoms and signs: Secondary | ICD-10-CM

## 2018-05-24 MED ORDER — ONDANSETRON 4 MG PO TBDP: 4.0000 mg | ORAL_TABLET | Freq: Three times a day (TID) | ORAL | 0 refills | Status: DC | PRN

## 2018-05-24 MED ORDER — BENZONATATE 100 MG PO CAPS: 100.0000 mg | ORAL_CAPSULE | Freq: Three times a day (TID) | ORAL | 0 refills | Status: DC | PRN

## 2018-05-24 MED ORDER — ONDANSETRON 4 MG PO TBDP
4.0000 mg | ORAL_TABLET | Freq: Once | ORAL | Status: AC
  Administered 2018-05-24: 4 mg via ORAL
  Filled 2018-05-24: qty 1

## 2018-05-24 MED ORDER — BENZONATATE 100 MG PO CAPS
100.0000 mg | ORAL_CAPSULE | Freq: Once | ORAL | Status: AC
  Administered 2018-05-24: 100 mg via ORAL
  Filled 2018-05-24: qty 1

## 2018-05-24 MED ORDER — IBUPROFEN 800 MG PO TABS
800.0000 mg | ORAL_TABLET | Freq: Once | ORAL | Status: AC
  Administered 2018-05-24: 800 mg via ORAL
  Filled 2018-05-24: qty 1

## 2018-05-24 NOTE — ED Provider Notes (Signed)
[facility]-EMERGENCY DEPT Provider Note   CSN: [id number] Arrival date & time: 05/23/18  2208     History   Chief Complaint Chief Complaint  Patient presents with  . Cough  . Generalized Body Aches    HPI Faith Beck is a 28 y.o. female.  28 year old female presents to the emergency department for evaluation of flu-like symptoms.  She notes onset of cough with nasal congestion, body aches, chills, sore throat x3 days.  Symptoms have been constant, largely unchanged.  She has been using Alka-Seltzer cold and flu without significant improvement.  She did take some ibuprofen earlier today.  She has had some nausea, but continues to be able to tolerate fluids.  No abdominal pain, diarrhea.  She is unaware of any sick contacts, but works in [employer].  The history is provided by the patient. No language interpreter was used.  Cough    Past Medical History:  Diagnosis Date  . Asthma   . Depression   . Hypothyroid   . Kidney stones     Patient Active Problem List   Diagnosis Date Noted  . Mild intermittent asthma with acute exacerbation 01/10/2018  . Anxiety and depression 01/10/2018  . Kidney stones 01/10/2018    Past Surgical History:  Procedure Laterality Date  . CESAREAN SECTION    . CHOLECYSTECTOMY N/A 02/19/2015   Procedure: LAPAROSCOPIC CHOLECYSTECTOMY WITH INTRAOPERATIVE CHOLANGIOGRAM;  Surgeon: Darnell Levelodd Gerkin, MD;  Location: [facility];  Service: General;  Laterality: N/A;  . DILATION AND CURETTAGE OF UTERUS    . TUBAL LIGATION       OB History    Gravida  3   Para      Term      Preterm      AB      Living  2     SAB      TAB      Ectopic      Multiple      Live Births  2            Home Medications    Prior to Admission medications   Medication Sig Start Date End Date Taking? Authorizing Provider  albuterol (PROVENTIL HFA;VENTOLIN HFA) 108 (90 Base) MCG/ACT inhaler Inhale 2 puffs into the lungs every 6  (six) hours as needed for wheezing or shortness of breath. 10/17/17   Myles LippsSantiago, Irma M, MD  benzonatate (TESSALON) 100 MG capsule Take 1 capsule (100 mg total) by mouth 3 (three) times daily as needed for cough. 05/24/18   Antony MaduraHumes, Sarit Sparano, PA-C  escitalopram (LEXAPRO) 10 MG tablet Take 1 tablet (10 mg total) by mouth daily. 01/10/18   Myles LippsSantiago, Irma M, MD  loratadine (CLARITIN) 10 MG tablet Take 10 mg by mouth daily.    [provider]  omeprazole (PRILOSEC) 40 MG capsule Take 1 capsule (40 mg total) by mouth 2 (two) times daily. 03/07/18   Rachael FeeJacobs, Daniel P, MD  ondansetron (ZOFRAN ODT) 4 MG disintegrating tablet Take 1 tablet (4 mg total) by mouth every 8 (eight) hours as needed for nausea or vomiting. 05/24/18   Antony MaduraHumes, Deasia Chiu, PA-C  ranitidine (ZANTAC) 150 MG tablet Take 1 tablet (150 mg total) by mouth 2 (two) times daily. 10/06/17   Myles LippsSantiago, Irma M, MD  valproic acid (DEPAKENE) 250 MG capsule Take 1 capsule (250 mg total) by mouth 2 (two) times daily. 01/17/18   Myles LippsSantiago, Irma M, MD    Family History Family History  Problem Relation Age  of Onset  . Asthma Mother   . COPD Mother   . Cancer Father   . Diabetes Father   . Heart disease Father   . Pancreatic cancer Father   . Migraines Brother   . Asthma Daughter   . Healthy Son     Social History Social History   Tobacco Use  . Smoking status: Never Smoker  . Smokeless tobacco: Never Used  Substance Use Topics  . Alcohol use: No  . Drug use: Yes    Types: Marijuana     Allergies   Patient has no known allergies.   Review of Systems Review of Systems  Respiratory: Positive for cough.   Ten systems reviewed and are negative for acute change, except as noted in the HPI.    Physical Exam Updated Vital Signs BP 117/74   Pulse 99   Temp 99.3 F (37.4 C)   Resp 17   LMP 05/23/2018   SpO2 99%   Physical Exam Vitals signs and nursing note reviewed.  Constitutional:      General: She is not in acute distress.     Appearance: She is well-developed. She is not diaphoretic.     Comments: Nontoxic appearing and in NAD. Appears fatigued.  HENT:     Head: Normocephalic and atraumatic.     Nose:     Comments: Audible nasal congestion    Mouth/Throat:     Comments: Clear posterior oropharynx. Tolerating secretions. Eyes:     General: No scleral icterus.    Conjunctiva/sclera: Conjunctivae normal.  Neck:     Musculoskeletal: Normal range of motion.     Comments: No meningismus Cardiovascular:     Rate and Rhythm: Normal rate and regular rhythm.     Pulses: Normal pulses.  Pulmonary:     Effort: Pulmonary effort is normal. No respiratory distress.     Breath sounds: No stridor. No wheezing or rales.     Comments: Lungs CTAB. Respirations even and unlabored. Musculoskeletal: Normal range of motion.  Skin:    General: Skin is warm and dry.     Coloration: Skin is not pale.     Findings: No erythema or rash.  Neurological:     General: No focal deficit present.     Mental Status: She is alert and oriented to person, place, and time.     Coordination: Coordination normal.  Psychiatric:        Behavior: Behavior normal.      ED Treatments / Results  Labs (all labs ordered are listed, but only abnormal results are displayed) Labs Reviewed - No data to display  EKG None  Radiology No results found.  Procedures Procedures (including critical care time)  Medications Ordered in ED Medications  ondansetron (ZOFRAN-ODT) disintegrating tablet 4 mg (has no administration in time range)  benzonatate (TESSALON) capsule 100 mg (has no administration in time range)  ibuprofen (ADVIL,MOTRIN) tablet 800 mg (has no administration in time range)     Initial Impression / Assessment and Plan / ED Course  I have reviewed the triage vital signs and the nursing notes.  Pertinent labs & imaging results that were available during my care of the patient were reviewed by me and considered in my medical  decision making (see chart for details).     28 year old female presenting to the ED for flulike illness.  She is on day 3 of symptoms and is out of the window for Tamiflu.  She is afebrile in the ED  with reassuring vital signs.  Lungs clear to auscultation bilaterally.  She is tolerating secretions and has been drinking fluids while in the waiting room.  Have discussed supportive care and have encouraged primary care follow-up.  Prescriptions provided for Tessalon and Zofran.  Advised use of other OTC remedies.  Return precautions discussed and provided.  Patient discharged in stable condition with no unaddressed concerns.   Final Clinical Impressions(s) / ED Diagnoses   Final diagnoses:  Flu-like symptoms    ED Discharge Orders         Ordered    benzonatate (TESSALON) 100 MG capsule  3 times daily PRN     05/24/18 0043    ondansetron (ZOFRAN ODT) 4 MG disintegrating tablet  Every 8 hours PRN     05/24/18 0043           Antony Madura, PA-C 05/24/18 0057    Mesner, Barbara Cower, MD 05/24/18 [phone]

## 2018-05-24 NOTE — Discharge Instructions (Signed)
We recommend the use of 600 mg ibuprofen every 6 hours for body aches, fever.  You may supplement this with Tylenol as needed for fever and headache.  Use Tessalon as prescribed for cough.  You may benefit from Zyrtec or Claritin use for congestion management.  Use Zofran as needed for management of nausea.  Drink plenty of fluids to prevent dehydration.  Get plenty of rest.  Follow-up with your primary doctor to ensure resolution of symptoms.

## 2018-07-26 DIAGNOSIS — N390 Urinary tract infection, site not specified: Secondary | ICD-10-CM | POA: Diagnosis not present

## 2018-07-26 DIAGNOSIS — K219 Gastro-esophageal reflux disease without esophagitis: Secondary | ICD-10-CM | POA: Insufficient documentation

## 2018-07-26 DIAGNOSIS — R102 Pelvic and perineal pain: Secondary | ICD-10-CM | POA: Diagnosis not present

## 2018-07-26 DIAGNOSIS — N926 Irregular menstruation, unspecified: Secondary | ICD-10-CM | POA: Diagnosis not present

## 2018-07-26 DIAGNOSIS — J45909 Unspecified asthma, uncomplicated: Secondary | ICD-10-CM | POA: Insufficient documentation

## 2018-07-26 DIAGNOSIS — E669 Obesity, unspecified: Secondary | ICD-10-CM | POA: Insufficient documentation

## 2018-07-26 DIAGNOSIS — F17200 Nicotine dependence, unspecified, uncomplicated: Secondary | ICD-10-CM | POA: Insufficient documentation

## 2018-07-26 DIAGNOSIS — K449 Diaphragmatic hernia without obstruction or gangrene: Secondary | ICD-10-CM | POA: Insufficient documentation

## 2018-07-26 DIAGNOSIS — R634 Abnormal weight loss: Secondary | ICD-10-CM | POA: Diagnosis not present

## 2018-07-30 ENCOUNTER — Telehealth: Payer: Self-pay | Admitting: Family Medicine

## 2018-07-30 NOTE — Telephone Encounter (Signed)
Called pt left VM for her to call and cancel appt for 08/01/2018 and change to virtual visit with [place] before 11:20 on 3/25//2020 COVID-19 protocol

## 2018-08-01 ENCOUNTER — Other Ambulatory Visit: Payer: Self-pay

## 2018-08-01 ENCOUNTER — Telehealth (INDEPENDENT_AMBULATORY_CARE_PROVIDER_SITE_OTHER): Payer: Self-pay | Admitting: Family Medicine

## 2018-08-01 DIAGNOSIS — N390 Urinary tract infection, site not specified: Secondary | ICD-10-CM | POA: Diagnosis not present

## 2018-08-01 DIAGNOSIS — F3131 Bipolar disorder, current episode depressed, mild: Secondary | ICD-10-CM

## 2018-08-01 DIAGNOSIS — E039 Hypothyroidism, unspecified: Secondary | ICD-10-CM | POA: Diagnosis not present

## 2018-08-01 DIAGNOSIS — G43709 Chronic migraine without aura, not intractable, without status migrainosus: Secondary | ICD-10-CM

## 2018-08-01 MED ORDER — QUETIAPINE FUMARATE 50 MG PO TABS: 50.0000 mg | ORAL_TABLET | Freq: Two times a day (BID) | ORAL | 1 refills | Status: DC

## 2018-08-01 MED ORDER — SUMATRIPTAN SUCCINATE 50 MG PO TABS: ORAL_TABLET | ORAL | 2 refills | Status: DC

## 2018-08-01 NOTE — Progress Notes (Signed)
Virtual Visit via telephone Note  I connected with patient on 08/01/18 at 256pm by telephone and verified that I am speaking with the correct person using two identifiers. Faith Beck is currently located at home and patient is currently with her during visit. The provider, Myles Lipps, MD is located in their home at time of visit.  I discussed the limitations, risks, security and privacy concerns of performing an evaluation and management service by telephone and the availability of in person appointments. I also discussed with the patient that there may be a patient responsible charge related to this service. The patient expressed understanding and agreed to proceed.  CC: mood not well controlled  Telephone visit today for worsening anxiety   HPI   Last OV sept 2019 - concerns for bipolar, referred to psych Started on lexapro and valproic acid in sept Stopped taking them about 3 months ago as she was feeling weird, heart racing, "dream state" She did try just one at a time lexapro made her very tired, was taking at bedtime Valproic acid made her feel weird Going to family services for counseling and psych - does not know when she will be able to see psych She reports that therapists after eval also thought she was bipolar type 2.  Her aunt has been diagnosed with bipolar disorder and did well on seroquel ? Recently saw gyn:  treated for UTI with macrobid, sx not fully resolved so gyn sent in today cipro Also was told she had hypothyroidism and started her on levothyroxine, she was dx during pregnancy 2015  Has had return of migraines Long standing h/o  Used to get botox injection Has tried OTC meds wo relief Getting 2-3 a week Used to take fioricet  Fall Risk  08/01/2018 01/17/2018 11/23/2017 11/03/2017 10/17/2017  Falls in the past year? 0 No No No Yes  Number falls in past yr: - - - - 1  Injury with Fall? - - - - No  Comment - - - - -     Depression screen [facility]  2/9 08/01/2018 01/17/2018 01/17/2018  Decreased Interest 0 1 0  Down, Depressed, Hopeless 0 1 0  PHQ - 2 Score 0 2 0  Altered sleeping - 3 -  Tired, decreased energy - 3 -  Change in appetite - 3 -  Feeling bad or failure about yourself  - 0 -  Trouble concentrating - 3 -  Moving slowly or fidgety/restless - 3 -  Suicidal thoughts - 0 -  PHQ-9 Score - 17 -  Difficult doing work/chores - Somewhat difficult -    No Known Allergies  Prior to Admission medications   Medication Sig Start Date End Date Taking? Authorizing Provider  albuterol (PROVENTIL HFA;VENTOLIN HFA) 108 (90 Base) MCG/ACT inhaler Inhale 2 puffs into the lungs every 6 (six) hours as needed for wheezing or shortness of breath. 10/17/17  Yes Myles Lipps, MD  loratadine (CLARITIN) 10 MG tablet Take 10 mg by mouth daily.   Yes [provider]  omeprazole (PRILOSEC) 40 MG capsule Take 1 capsule (40 mg total) by mouth 2 (two) times daily. 03/07/18  Yes Rachael Fee, MD  ondansetron (ZOFRAN ODT) 4 MG disintegrating tablet Take 1 tablet (4 mg total) by mouth every 8 (eight) hours as needed for nausea or vomiting. 05/24/18  Yes Antony Madura, PA-C  escitalopram (LEXAPRO) 10 MG tablet Take 1 tablet (10 mg total) by mouth daily. Patient not taking: Reported on 08/01/2018  01/10/18   Myles Lipps, MD  ranitidine (ZANTAC) 150 MG tablet Take 1 tablet (150 mg total) by mouth 2 (two) times daily. Patient not taking: Reported on 08/01/2018 10/06/17   Myles Lipps, MD  valproic acid (DEPAKENE) 250 MG capsule Take 1 capsule (250 mg total) by mouth 2 (two) times daily. Patient not taking: Reported on 08/01/2018 01/17/18   Myles Lipps, MD    Past Medical History:  Diagnosis Date   Asthma    Depression    Hypothyroid    Kidney stones     Past Surgical History:  Procedure Laterality Date   CESAREAN SECTION     CHOLECYSTECTOMY N/A 02/19/2015   Procedure: LAPAROSCOPIC CHOLECYSTECTOMY WITH INTRAOPERATIVE  CHOLANGIOGRAM;  Surgeon: Darnell Level, MD;  Location: [facility];  Service: General;  Laterality: N/A;   DILATION AND CURETTAGE OF UTERUS     TUBAL LIGATION      Social History   Tobacco Use   Smoking status: Never Smoker   Smokeless tobacco: Never Used  Substance Use Topics   Alcohol use: No    Family History  Problem Relation Age of Onset   Asthma Mother    COPD Mother    Cancer Father    Diabetes Father    Heart disease Father    Pancreatic cancer Father    Migraines Brother    Asthma Daughter    Healthy Son     ROS  Objective  Vitals as reported by the patient: none  There were no vitals filed for this visit.  ASSESSMENT and PLAN  1. Bipolar affective disorder, currently depressed, mild (HCC) Starting seroquel 50mg  at bedtime. Can increase to 100mg  at bedtime in 2 weeks. Reviewed r/se/b. Keep appt with psych once scheduled  2. Chronic migraine without aura without status migrainosus, not intractable Starting imitrex. Reviewed r/se/b. Consider prophylactic treatment   Other orders - QUEtiapine (SEROQUEL) 50 MG tablet; Take 1 tablet (50 mg total) by mouth 2 (two) times daily. - SUMAtriptan (IMITREX) 50 MG tablet; Take 1 tablet (50mg ) at onset of migraine. May repeat in 2 hours if headache persists or recurs. Do not exceed 100mg  in 24 hour period.  FOLLOW-UP: 6 weeks   The above assessment and management plan was discussed with the patient. The patient verbalized understanding of and has agreed to the management plan. Patient is aware to call the clinic if symptoms persist or worsen. Patient is aware when to return to the clinic for a follow-up visit. Patient educated on when it is appropriate to go to the emergency department.    I provided 22 minutes of non-face-to-face time during this encounter.  Myles Lipps, MD Primary Care at [facility] [address] [place], [place] [address] Ph.  [phone] Fax [phone]

## 2018-09-12 ENCOUNTER — Other Ambulatory Visit: Payer: Self-pay

## 2018-09-12 ENCOUNTER — Ambulatory Visit (INDEPENDENT_AMBULATORY_CARE_PROVIDER_SITE_OTHER): Payer: Self-pay | Admitting: Family Medicine

## 2018-09-12 ENCOUNTER — Encounter: Payer: Self-pay | Admitting: Family Medicine

## 2018-09-12 DIAGNOSIS — M545 Low back pain, unspecified: Secondary | ICD-10-CM

## 2018-09-12 DIAGNOSIS — G8929 Other chronic pain: Secondary | ICD-10-CM

## 2018-09-12 DIAGNOSIS — F3131 Bipolar disorder, current episode depressed, mild: Secondary | ICD-10-CM

## 2018-09-12 DIAGNOSIS — M25552 Pain in left hip: Secondary | ICD-10-CM

## 2018-09-12 DIAGNOSIS — F411 Generalized anxiety disorder: Secondary | ICD-10-CM

## 2018-09-12 MED ORDER — GABAPENTIN 300 MG PO CAPS: 300.0000 mg | ORAL_CAPSULE | Freq: Three times a day (TID) | ORAL | 1 refills | Status: DC

## 2018-09-12 MED ORDER — MELOXICAM 15 MG PO TABS: 15.0000 mg | ORAL_TABLET | Freq: Every day | ORAL | 1 refills | Status: DC

## 2018-09-12 MED ORDER — QUETIAPINE FUMARATE 50 MG PO TABS: 50.0000 mg | ORAL_TABLET | Freq: Every day | ORAL | 1 refills | Status: DC

## 2018-09-12 NOTE — Patient Instructions (Signed)

## 2018-09-12 NOTE — Progress Notes (Signed)
Virtual Visit Note  I connected with patient on 09/12/18 at 153pm by phone and verified that I am speaking with the correct person using two identifiers. Faith Beck is currently located at home and patient is currently with them during visit. The provider, Myles LippsIrma M Santiago, MD is located in their office at time of visit.  I discussed the limitations, risks, security and privacy concerns of performing an evaluation and management service by telephone and the availability of in person appointments. I also discussed with the patient that there may be a patient responsible charge related to this service. The patient expressed understanding and agreed to proceed.   CC: followup  HPI ? Last OV march 2020 Started on seroquel 50mg  at bedtime after 3 weeks increased to 100mg  Had to decreased back to 50mg  due to oversedation but no improvement in anxiety She reports feeling a lot better mentally Still having some anxiety: worsened by current situation Depression is much better, she is back to being engaging with her family and home Has been taking her synthroid as prescribed Has a telemed appt with her therapist tomorrow Has not had appt with psychiatry  Past trials: lexapro - 2011, could not afford it, was helping, 2020 - made her very tired pristiq - too expensive effexor - changed quickly to pristiq, does not remember why sertaline - at 100mg  tried to commit suicide, April 2016, she was having SI thoughts and [facility], increased sertraline 100mg , October 12 2014, took all her sleeping pills, [facility] Hydroxyzine - makes her very jittery Gabapentin - seemed to help, ran out of insurance  Buspar - 2017, and did not work Valproic acid - 2019, made her feel weird   Mostly bilateral sided Low back pain for about the past year Has tried heating pad, icy hot rub, OTC patches, ibu and aleve For past week, it has been radiating into her left hip  and back of her thigh xrays and ct in 2018 - no DDD  She reports tenderness over Left GT, non over IT or piriformis She does report TTP over GM  No Known Allergies  Prior to Admission medications   Medication Sig Start Date End Date Taking? Authorizing Provider  albuterol (PROVENTIL HFA;VENTOLIN HFA) 108 (90 Base) MCG/ACT inhaler Inhale 2 puffs into the lungs every 6 (six) hours as needed for wheezing or shortness of breath. 10/17/17   Myles LippsSantiago, Shiven Junious M, MD  loratadine (CLARITIN) 10 MG tablet Take 10 mg by mouth daily.    [provider]  omeprazole (PRILOSEC) 40 MG capsule Take 1 capsule (40 mg total) by mouth 2 (two) times daily. 03/07/18   Rachael FeeJacobs, Daniel P, MD  ondansetron (ZOFRAN ODT) 4 MG disintegrating tablet Take 1 tablet (4 mg total) by mouth every 8 (eight) hours as needed for nausea or vomiting. 05/24/18   Antony MaduraHumes, Kelly, PA-C  QUEtiapine (SEROQUEL) 50 MG tablet Take 1 tablet (50 mg total) by mouth 2 (two) times daily. 08/01/18   Myles LippsSantiago, Manvir Thorson M, MD  ranitidine (ZANTAC) 150 MG tablet Take 1 tablet (150 mg total) by mouth 2 (two) times daily. Patient not taking: Reported on 08/01/2018 10/06/17   Myles LippsSantiago, Alena Blankenbeckler M, MD  SUMAtriptan (IMITREX) 50 MG tablet Take 1 tablet (50mg ) at onset of migraine. May repeat in 2 hours if headache persists or recurs. Do not exceed 100mg  in 24 hour period. 08/01/18   Myles LippsSantiago, Nox Talent M, MD    Past Medical History:  Diagnosis Date  . Asthma   . Depression   . Hypothyroid   . Kidney stones     Past Surgical History:  Procedure Laterality Date  . CESAREAN SECTION    . CHOLECYSTECTOMY N/A 02/19/2015   Procedure: LAPAROSCOPIC CHOLECYSTECTOMY WITH INTRAOPERATIVE CHOLANGIOGRAM;  Surgeon: Darnell Level, MD;  Location: [facility];  Service: General;  Laterality: N/A;  . DILATION AND CURETTAGE OF UTERUS    . TUBAL LIGATION      Social History   Tobacco Use  . Smoking status: Never Smoker  . Smokeless tobacco: Never Used  Substance Use Topics  . Alcohol  use: No    Family History  Problem Relation Age of Onset  . Asthma Mother   . COPD Mother   . Cancer Father   . Diabetes Father   . Heart disease Father   . Pancreatic cancer Father   . Migraines Brother   . Asthma Daughter   . Healthy Son     ROS Per hpi  Objective  Vitals as reported by the patient: none   ASSESSMENT and PLAN  1. Chronic bilateral low back pain, unspecified whether sciatica present 2. Chronic left hip pain Exam limited. Disc? GM tendonitis/GT bursitis? Treating conservatively. Discussed exercises and stretching, handout available thru mychart. Consider PT. New meds r/se/b reviewed  3. Generalized anxiety disorder Improved. Adding gabapentin, which has helped in the past. Reviewed r/se/b. Continue with counseling.  4. Bipolar affective disorder, currently depressed, mild (HCC) Improved. Cont seroquel 50mg  at bedtime. Pending psych appt.   Other orders - gabapentin (NEURONTIN) 300 MG capsule; Take 1 capsule (300 mg total) by mouth 3 (three) times daily. - meloxicam (MOBIC) 15 MG tablet; Take 1 tablet (15 mg total) by mouth daily. - QUEtiapine (SEROQUEL) 50 MG tablet; Take 1 tablet (50 mg total) by mouth at bedtime.  FOLLOW-UP: 3 months   The above assessment and management plan was discussed with the patient. The patient verbalized understanding of and has agreed to the management plan. Patient is aware to call the clinic if symptoms persist or worsen. Patient is aware when to return to the clinic for a follow-up visit. Patient educated on when it is appropriate to go to the emergency department.    I provided 25 minutes of non-face-to-face time during this encounter.  Myles Lipps, MD Primary Care at [facility] [address] [place], [place] [address] Ph.  [phone] Fax [phone]

## 2018-09-13 ENCOUNTER — Encounter: Payer: Self-pay | Admitting: Gastroenterology

## 2018-11-08 ENCOUNTER — Other Ambulatory Visit: Payer: Self-pay | Admitting: Family Medicine

## 2018-11-08 NOTE — Telephone Encounter (Signed)
Requested Prescriptions  Pending Prescriptions Disp Refills  . gabapentin (NEURONTIN) 300 MG capsule [Pharmacy Med Name: GABAPENTIN 300MG  CAPSULES] 90 capsule 1    Sig: TAKE 1 CAPSULE(300 MG) BY MOUTH THREE TIMES DAILY     Neurology: Anticonvulsants - gabapentin Passed - 11/08/2018 11:04 AM      Passed - Valid encounter within last 12 months    Recent Outpatient Visits          1 month ago Chronic bilateral low back pain, unspecified whether sciatica present   [facility], Lilia Argue, MD   9 months ago Mood disorder [facility])   [facility], Lilia Argue, MD   10 months ago Mild intermittent asthma with acute exacerbation   [facility], Lilia Argue, MD   11 months ago Abscess   Primary Care at [place], [place] D, PA-C   1 year ago Acute right-sided low back pain without sciatica   [facility], Lilia Argue, MD

## 2018-11-21 ENCOUNTER — Other Ambulatory Visit: Payer: Self-pay | Admitting: Family Medicine

## 2018-11-22 DIAGNOSIS — F3189 Other bipolar disorder: Secondary | ICD-10-CM | POA: Diagnosis not present

## 2018-12-09 DIAGNOSIS — R220 Localized swelling, mass and lump, head: Secondary | ICD-10-CM | POA: Diagnosis not present

## 2018-12-09 DIAGNOSIS — K120 Recurrent oral aphthae: Secondary | ICD-10-CM | POA: Diagnosis not present

## 2018-12-09 DIAGNOSIS — K047 Periapical abscess without sinus: Secondary | ICD-10-CM | POA: Diagnosis not present

## 2019-01-09 DIAGNOSIS — Z3202 Encounter for pregnancy test, result negative: Secondary | ICD-10-CM | POA: Diagnosis not present

## 2019-01-09 DIAGNOSIS — R109 Unspecified abdominal pain: Secondary | ICD-10-CM | POA: Diagnosis not present

## 2019-01-10 DIAGNOSIS — R109 Unspecified abdominal pain: Secondary | ICD-10-CM | POA: Diagnosis not present

## 2019-02-14 ENCOUNTER — Other Ambulatory Visit: Payer: Self-pay | Admitting: Family Medicine

## 2019-02-14 DIAGNOSIS — F3189 Other bipolar disorder: Secondary | ICD-10-CM | POA: Diagnosis not present

## 2019-02-18 DIAGNOSIS — M9903 Segmental and somatic dysfunction of lumbar region: Secondary | ICD-10-CM | POA: Diagnosis not present

## 2019-02-18 DIAGNOSIS — Z1151 Encounter for screening for human papillomavirus (HPV): Secondary | ICD-10-CM | POA: Diagnosis not present

## 2019-02-18 DIAGNOSIS — R102 Pelvic and perineal pain: Secondary | ICD-10-CM | POA: Diagnosis not present

## 2019-02-18 DIAGNOSIS — Z98891 History of uterine scar from previous surgery: Secondary | ICD-10-CM | POA: Diagnosis not present

## 2019-02-18 DIAGNOSIS — M545 Low back pain: Secondary | ICD-10-CM | POA: Diagnosis not present

## 2019-02-18 DIAGNOSIS — Z87442 Personal history of urinary calculi: Secondary | ICD-10-CM | POA: Diagnosis not present

## 2019-02-18 DIAGNOSIS — M609 Myositis, unspecified: Secondary | ICD-10-CM | POA: Diagnosis not present

## 2019-02-18 DIAGNOSIS — N920 Excessive and frequent menstruation with regular cycle: Secondary | ICD-10-CM | POA: Diagnosis not present

## 2019-02-18 DIAGNOSIS — Z9851 Tubal ligation status: Secondary | ICD-10-CM | POA: Diagnosis not present

## 2019-02-18 DIAGNOSIS — M5416 Radiculopathy, lumbar region: Secondary | ICD-10-CM | POA: Diagnosis not present

## 2019-02-18 DIAGNOSIS — Z01419 Encounter for gynecological examination (general) (routine) without abnormal findings: Secondary | ICD-10-CM | POA: Diagnosis not present

## 2019-02-18 LAB — HM PAP SMEAR: HM Pap smear: NEGATIVE

## 2019-02-18 LAB — RESULTS CONSOLE HPV: CHL HPV: NEGATIVE

## 2019-04-11 DIAGNOSIS — F3189 Other bipolar disorder: Secondary | ICD-10-CM | POA: Diagnosis not present

## 2019-05-10 HISTORY — PX: ABDOMINAL HYSTERECTOMY: SHX81

## 2020-01-17 ENCOUNTER — Other Ambulatory Visit: Payer: Self-pay | Admitting: Family Medicine

## 2020-01-17 NOTE — Telephone Encounter (Signed)
Requested medication (s) are due for refill today: no  Requested medication (s) are on the active medication list:yes   Last refill:  11/22/2018  Future visit scheduled: no  Notes to clinic: overdue for follow up appointment    Requested Prescriptions  Pending Prescriptions Disp Refills   VENTOLIN HFA 108 (90 Base) MCG/ACT inhaler [Pharmacy Med Name: VENTOLIN HFA INH W/DOS CTR 200PUFFS] 18 g 2    Sig: INHALE 2 PUFFS INTO THE LUNGS EVERY 6 HOURS AS NEEDED FOR WHEEZING OR SHORTNESS OF BREATH      Pulmonology:  Beta Agonists Failed - 01/17/2020 10:55 AM      Failed - One inhaler should last at least one month. If the patient is requesting refills earlier, contact the patient to check for uncontrolled symptoms.      Failed - Valid encounter within last 12 months    Recent Outpatient Visits           1 year ago Chronic bilateral low back pain, unspecified whether sciatica present   [facility], Meda Coffee, MD   1 year ago Bipolar affective disorder, currently depressed, mild [facility])   [facility], Meda Coffee, MD   2 years ago Mood disorder [facility])   [facility], Meda Coffee, MD   2 years ago Mild intermittent asthma with acute exacerbation   [facility], Meda Coffee, MD   2 years ago Abscess   Primary Care at [facility], Gerald Stabs, [place]

## 2020-05-02 IMAGING — CT CT RENAL STONE PROTOCOL
2 of 4 series · 16 of 46 positions shown, 18 images · non-contrast
Comparison: CT of the abdomen pelvis dated 01/17/2017

CLINICAL DATA: 27-year-old female with history of kidney stones
presenting with flank pain.

EXAM:
CT ABDOMEN AND PELVIS WITHOUT CONTRAST
TECHNIQUE: Multidetector CT imaging of the abdomen and pelvis was performed
following the standard protocol without IV contrast.

[Series 2: axial st · axial · 0.97mm/px · z∈[+1094,+1514]mm · 13 of 94 slices shown, 15 images]
[im 5/94  soft-tissue]
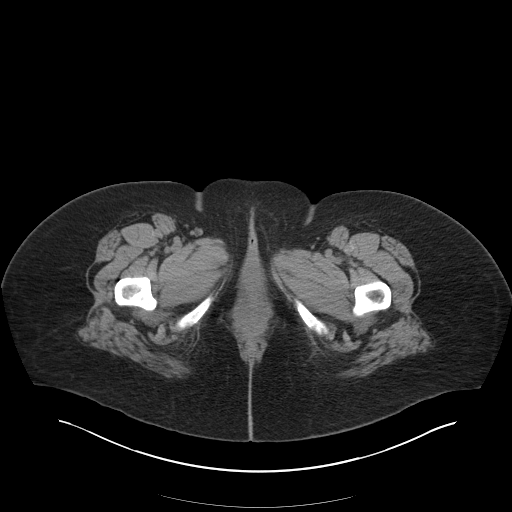
[im 5/94  bone]
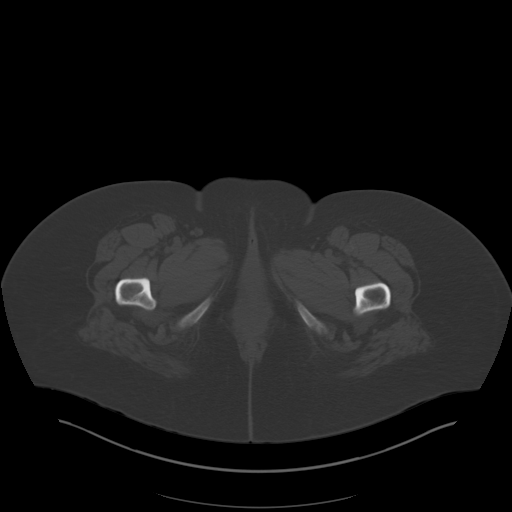
[im 14/94  soft-tissue]
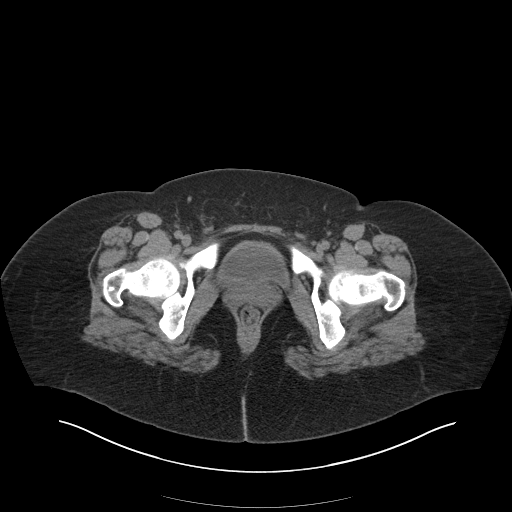
[im 19/94  soft-tissue]
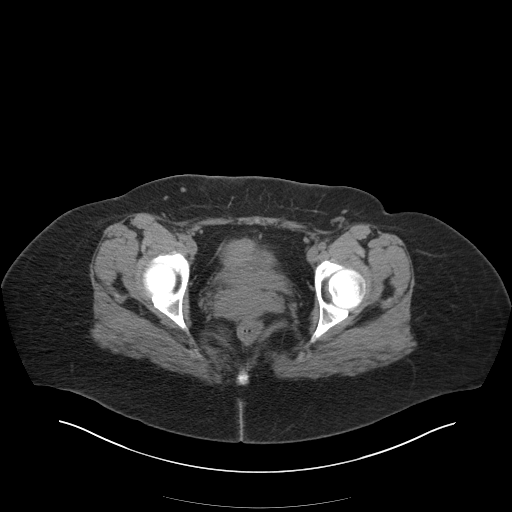
[im 28/94  soft-tissue]
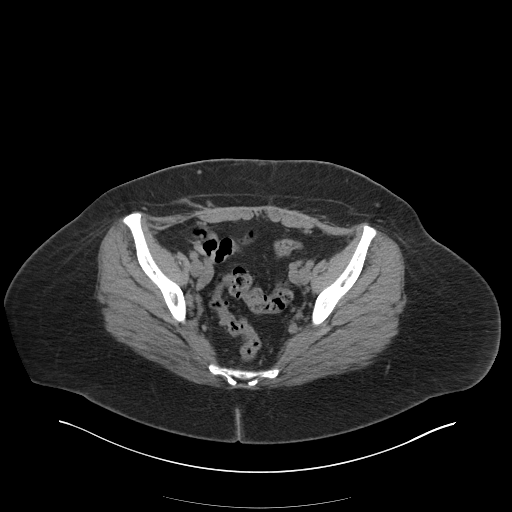
[im 33/94  soft-tissue]
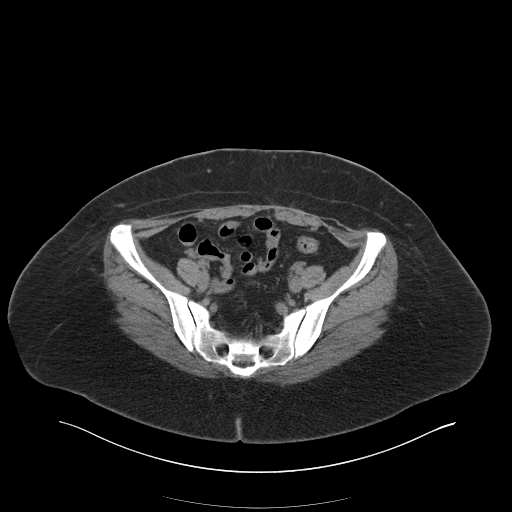
[im 42/94  soft-tissue]
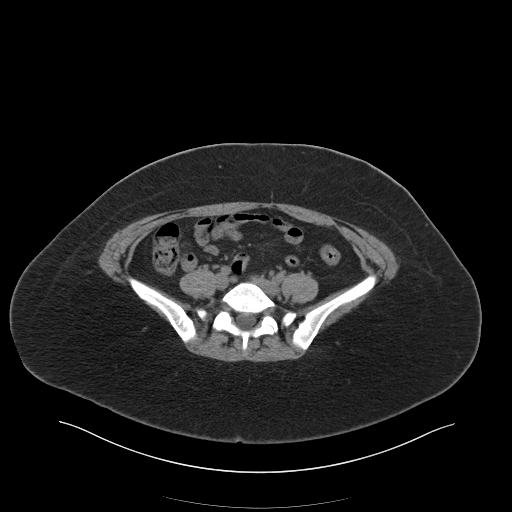
[im 47/94  soft-tissue]
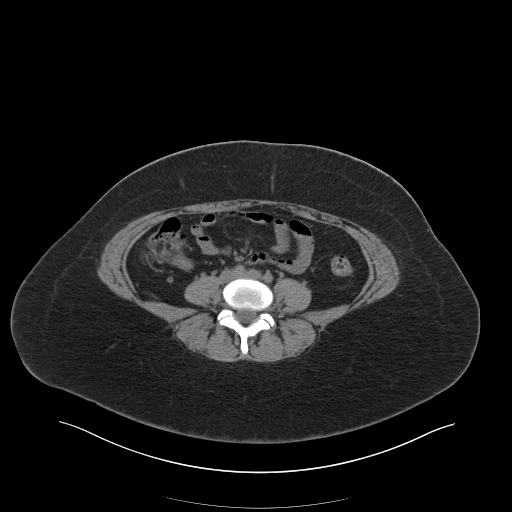
[im 52/94  soft-tissue]
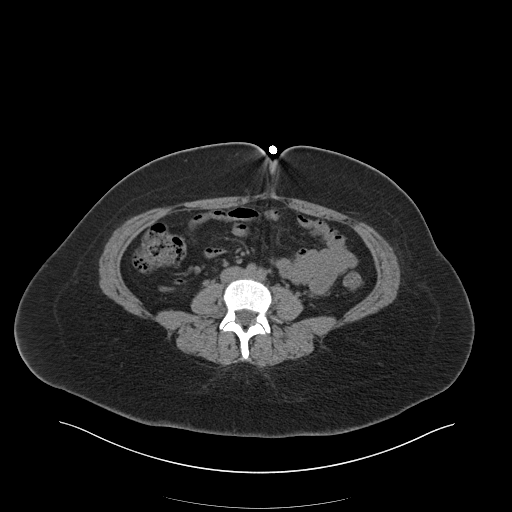
[im 61/94  soft-tissue]
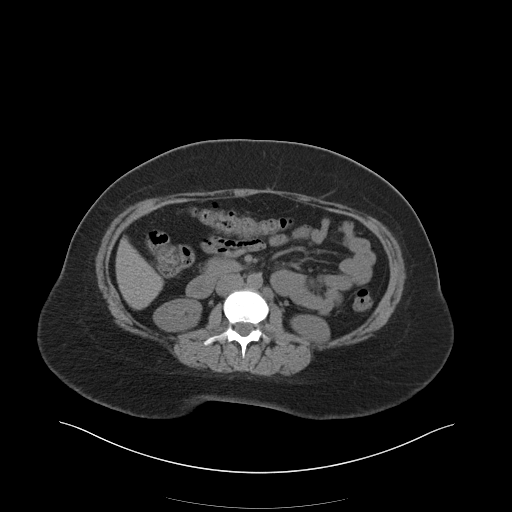
[im 61/94  bone]
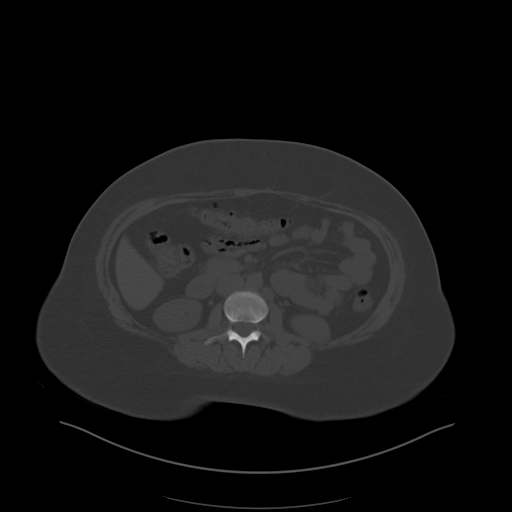
[im 66/94  soft-tissue]
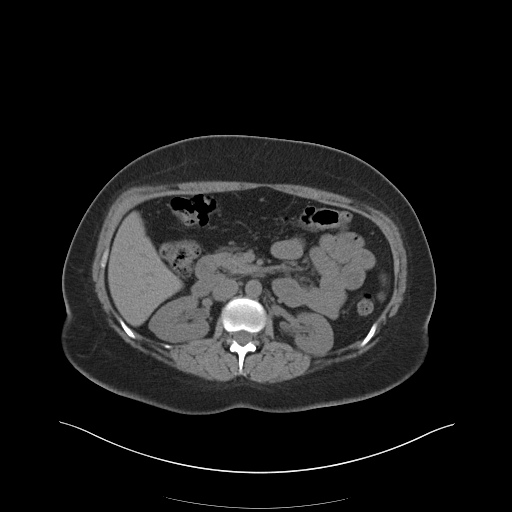
[im 75/94  soft-tissue]
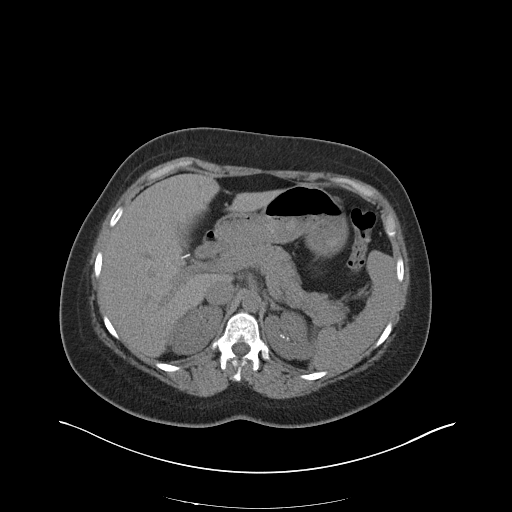
[im 80/94  soft-tissue]
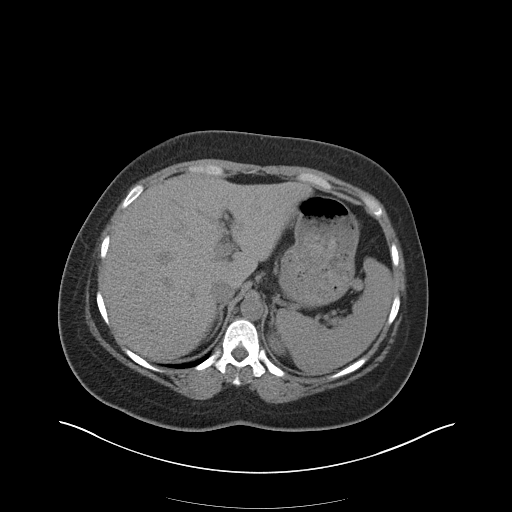
[im 89/94  soft-tissue]
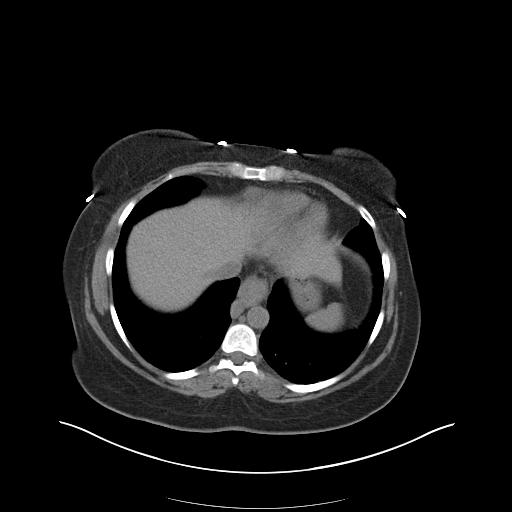

[Series 4: coronal · coronal · 0.93mm/px · 3 of 168 slices shown]
[im 56/168  soft-tissue]
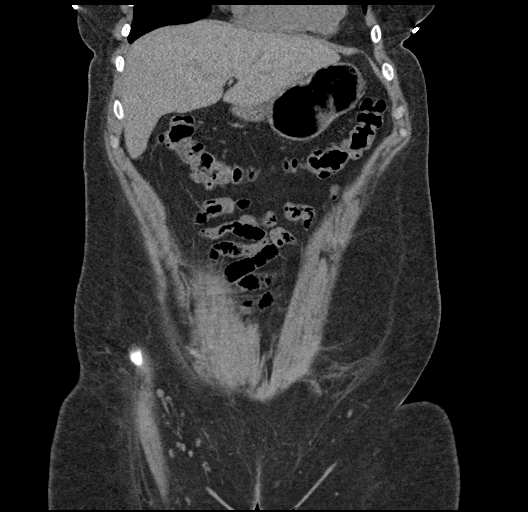
[im 75/168  soft-tissue]
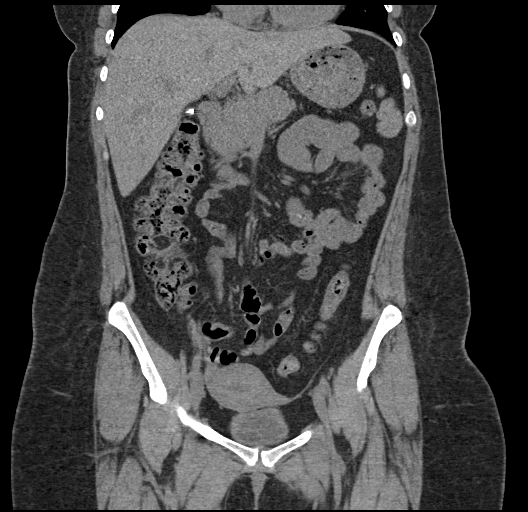
[im 93/168  soft-tissue]
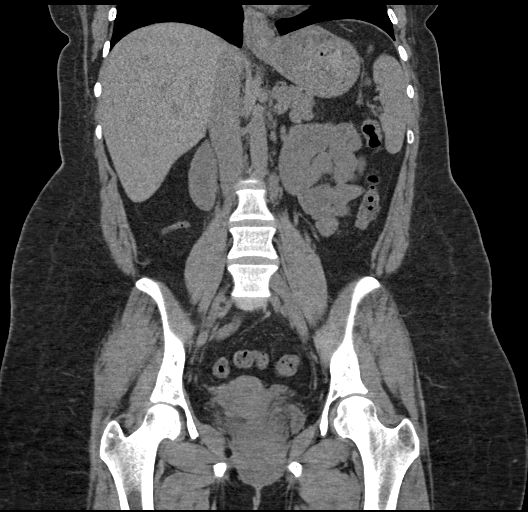

[16 of 46 positions shown; findings below may reference images not displayed]

FINDINGS: Evaluation of this exam is limited in the absence of intravenous
contrast.

Lower chest: The visualized lung bases are clear.

No intra-abdominal free air or free fluid.

Hepatobiliary: The liver is unremarkable. No intrahepatic biliary
ductal dilatation. Cholecystectomy.

Pancreas: Unremarkable. No pancreatic ductal dilatation or
surrounding inflammatory changes.

Spleen: Normal in size without focal abnormality.

Adrenals/Urinary Tract: The adrenal glands are unremarkable.
Multiple nonobstructing bilateral renal calculi measure up to 4 mm
in the interpolar aspect of the right kidney. There is no
hydronephrosis on either side. The visualized ureters and urinary
bladder appear unremarkable.

Stomach/Bowel: There is mild thickening of the distal esophagus
similar to prior CT. There is no bowel obstruction or active
inflammation. Normal appendix.

Vascular/Lymphatic: The abdominal aorta and IVC are grossly
unremarkable on this noncontrast CT. No portal venous gas. Mildly
enlarged lymph node adjacent to the distal esophagus measuring up to
13 mm in short axis similar to prior CT. This is of indeterminate
clinical significance or etiology. Clinical correlation is
recommended.

Reproductive: The uterus is anteverted and grossly unremarkable. The
ovaries appear unremarkable as well.

Other: None

Musculoskeletal: No acute or significant osseous findings.
IMPRESSION: 1. Multiple small nonobstructing bilateral renal calculi. No
hydronephrosis.
2. Mild thickened appearance of the distal esophagus similar to
prior CT with associated mildly enlarged adjacent lymph nodes of
indeterminate clinical significance or etiology. Clinical
correlation is recommended. No bowel obstruction. Normal appendix.

## 2021-02-08 DIAGNOSIS — F314 Bipolar disorder, current episode depressed, severe, without psychotic features: Secondary | ICD-10-CM | POA: Insufficient documentation

## 2021-02-09 DIAGNOSIS — F1210 Cannabis abuse, uncomplicated: Secondary | ICD-10-CM | POA: Insufficient documentation

## 2021-02-09 DIAGNOSIS — F1420 Cocaine dependence, uncomplicated: Secondary | ICD-10-CM | POA: Insufficient documentation

## 2021-03-09 ENCOUNTER — Ambulatory Visit (HOSPITAL_COMMUNITY)
Admission: EM | Admit: 2021-03-09 | Discharge: 2021-03-10 | Disposition: A | Discharge status: Home / Self Care | Payer: Medicaid Other | Attending: Psychiatry | Admitting: Psychiatry

## 2021-03-09 DIAGNOSIS — R5383 Other fatigue: Secondary | ICD-10-CM | POA: Insufficient documentation

## 2021-03-09 DIAGNOSIS — Z79899 Other long term (current) drug therapy: Secondary | ICD-10-CM | POA: Insufficient documentation

## 2021-03-09 DIAGNOSIS — Z760 Encounter for issue of repeat prescription: Secondary | ICD-10-CM | POA: Insufficient documentation

## 2021-03-09 DIAGNOSIS — Z9151 Personal history of suicidal behavior: Secondary | ICD-10-CM | POA: Insufficient documentation

## 2021-03-09 DIAGNOSIS — F319 Bipolar disorder, unspecified: Secondary | ICD-10-CM | POA: Insufficient documentation

## 2021-03-09 MED ORDER — QUETIAPINE FUMARATE 100 MG PO TABS: 100.0000 mg | ORAL_TABLET | Freq: Every day | ORAL | 0 refills | Status: DC

## 2021-03-09 NOTE — Progress Notes (Signed)
   03/09/21 2148  [facility] Triage Screening (Walk-ins at [facility] only)  How Did You Hear About [place]? Self  What Is the Reason for Your Visit/Call Today? Wei is a 30 yo female presenting to [facility] to get refill for medication she was given after a recent hospitalization at [facility]. Pt reports that she was discharged with 1 month supply of medication and she is down to only one pill left (Seroquel).  Pt reports that she has seroquel 100 mg hs and gabapentin 300 mg tid. Pt reports that prior to recent hospitalization pt was suicidal due to multiple external stressors. Pt tearful throughout triage. Pt denying current SI, HI, or AVH. Pt denies any current substance use.  How Long Has This Been Causing You Problems? 1-6 months  Have You Recently Had Any Thoughts About Hurting Yourself? Yes  How long ago did you have thoughts about hurting yourself? 3 weeks ago suicidal ideation--was hospitalized for a week at [facility]. Pt has been discharged for close to one month.  Are You Planning to Commit Suicide/Harm Yourself At This time? No  Have you Recently Had Thoughts About Hurting Someone Karolee Ohs? No  Are You Planning To Harm Someone At This Time? No  Are you currently experiencing any auditory, visual or other hallucinations? No  Have You Used Any Alcohol or Drugs in the Past 24 Hours? No  Do you have any current medical co-morbidities that require immediate attention? No  Clinician description of patient physical appearance/behavior: pt tearful and anxious  What Do You Feel Would Help You the Most Today? Medication(s)  If access to [facility] was not available, would you have sought care in the Emergency Department? Yes  Determination of Need Routine (7 days)  Options For Referral Medication Management

## 2021-03-10 NOTE — Discharge Instructions (Signed)

## 2021-03-10 NOTE — ED Provider Notes (Signed)
Behavioral Health Urgent Care Medical Screening Exam  Patient Name: Faith Beck MRN: [id number] Date of Evaluation: 03/10/21 Chief Complaint:  Medication refill  Diagnosis:  Final diagnoses:  Bipolar 1 disorder (HCC)    History of Present illness: Faith Beck is a 30 y.o. female with documented past psychiatric history significant for bipolar 1 disorder and bipolar affective disorder, as well as documented past medical history significant for kidney stones, migraines and asthma, who presents to the [facility] behavioral health urgent care [facility]) unaccompanied as a voluntary walk-in for medication refill.  Patient states that she was recently psychiatrically hospitalized at [facility] health [facility] regional and was discharged on 02/08/2021 on gabapentin 300 mg p.o. 3 times daily and Seroquel 100 mg p.o. nightly/at bedtime.  Patient reports that she received 30-day supplies of these prescriptions.  She reports that she has 1 tablet of gabapentin and 1 tablet of Seroquel left.  She reports that since her discharge from [facility] regional in October, she has become established with a therapist at [facility] [place] but she reports that the quickest appointment she was able to obtain with an outpatient psychiatrist was at [facility] [place] for 03/25/2021.  Thus, patient is requesting medication refills for her psychotropic medications noted above to serve as a bridge until she can follow up with her outpatient psychiatric provider on 03/25/2021.  Per chart review, patient was psychiatrically hospitalized at [facility] for bipolar 1 disorder from 02/02/2021 to 02/09/2021 and was discharged on 30-day supplies of gabapentin 300 mg p.o. 3 times daily and Seroquel 100 mg p.o. at nighttime.  Aside from the psychotropic medications noted above, patient states that she is not taking any additional  psychotropic medications at this time.  Patient denies SI currently on exam.  She denies experiencing any SI since she was discharged from [facility] regional in October.  She endorses history of 1 past suicide attempt in 2016 in which she attempted to overdose on trazodone at that time.  Patient endorses history of self-injurious behavior via cutting when she states that she has not intentionally cut herself since she was a teenager.  Patient denies history of intentionally burning herself.  Patient denies homicidal ideation.  She denies auditory hallucinations or visual hallucinations.  She denies paranoia.  Patient describes her sleep as poor, about 5 to 6 hours per night.  She denies anhedonia, feelings of guilt/hopelessness/worthlessness,.  She endorses intermittent decreased energy.  She endorses decreased concentration.  She denies appetite or weight changes.  Patient reports that she currently sees a therapist [facility] [place] in [place].  She reports that her 03/25/2021 psychiatry appointment will also be at the [facility] location for [facility] [place].  She reports that she last saw her therapist last Friday and she reports that her next therapy appointment is in a few weeks.  She reports that she is supposed to begin [facility] therapy session with her therapist.  Per chart review the patient is point regional hospitalization in October, patient's UDS was positive for cocaine and oxycodone at that time.  Patient denies any cocaine use since she was discharged from [facility] regional at the beginning of October 2022.  Patient reports that she did use marijuana about 1.5 weeks ago which she states she smoked a few hits of a joint at that time, but she states that she has not used any  marijuana since then and she states that she is not going to be using marijuana anymore.  Patient denies any additional illicit substance use since she was discharged from [facility] regional in  October 2022.  Patient does endorse drinking 1 alcoholic beverage at dinner about once per week but she states that she has not drank any alcohol since she was discharged from [facility] regional.  Patient endorses vaping nicotine intermittently.  Patient denies history of seizures.  Per PDMP review, patient has not been prescribed any controlled substances since December 2021. Patient lives in [place] with her husband and 27 year old, 19-year-old, and 69-year-old children.  Patient denies access to firearms or weapons.  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  She states that her main sources of support are her husband and her mother.  On exam, patient is sitting comfortably, in no acute distress.  Her mood is euthymic with congruent affect.  She is alert and oriented x4, cooperative, and answers all questions appropriately.  No indication the patient is responding to internal or external stimuli on exam.  Psychiatric Specialty Exam  Presentation  General Appearance:Appropriate for Environment; Well Groomed  Eye Contact:Good  Speech:Clear and Coherent; Normal Rate  Speech Volume:Normal  Handedness:No data recorded  Mood and Affect  Mood:Euthymic  Affect:Congruent   Thought Process  Thought Processes:Coherent; Goal Directed; Linear  Descriptions of Associations:Intact  Orientation:Full (Time, Place and Person)  Thought Content:Logical; WDL    Hallucinations:None  Ideas of Reference:None  Suicidal Thoughts:No  Homicidal Thoughts:No   Sensorium  Memory:Immediate Good; Recent Good; Remote Good  Judgment:Good  Insight:Good   Executive Functions  Concentration:Good  Attention Span:Good  Recall:Good  Fund of Knowledge:Good  Language:Good   Psychomotor Activity  Psychomotor Activity:Normal   Assets  Assets:Communication Skills; Desire for Improvement; Financial Resources/Insurance; Housing; Leisure Time;  Physical Health; Resilience; Social Support; Transportation; Vocational/Educational   Sleep  Sleep:Poor  Number of hours: 5   No data recorded  Physical Exam: Physical Exam Vitals reviewed.  Constitutional:      General: She is not in acute distress.    Appearance: Normal appearance. She is not ill-appearing, toxic-appearing or diaphoretic.  HENT:     Head: Normocephalic and atraumatic.     Right Ear: External ear normal.     Left Ear: External ear normal.     Nose: Nose normal.  Eyes:     General:        Right eye: No discharge.        Left eye: No discharge.     Conjunctiva/sclera: Conjunctivae normal.  Cardiovascular:     Rate and Rhythm: Normal rate.  Pulmonary:     Effort: Pulmonary effort is normal. No respiratory distress.  Musculoskeletal:        General: Normal range of motion.     Cervical back: Normal range of motion.  Neurological:     General: No focal deficit present.     Mental Status: She is alert and oriented to person, place, and time.     Comments: No tremor noted.   Psychiatric:        Attention and Perception: Attention and perception normal. She does not perceive auditory or visual hallucinations.        Mood and Affect: Mood and affect normal.        Speech: Speech normal.        Behavior: Behavior is not agitated, slowed, aggressive, withdrawn, hyperactive or combative.  Behavior is cooperative.        Thought Content: Thought content is not paranoid or delusional. Thought content does not include homicidal or suicidal ideation.   Review of Systems  Constitutional:  Positive for malaise/fatigue. Negative for chills, diaphoresis, fever and weight loss.  HENT:  Negative for congestion.   Respiratory:  Negative for cough and shortness of breath.   Cardiovascular:  Negative for chest pain and palpitations.  Gastrointestinal:  Negative for abdominal pain, constipation, diarrhea, nausea and vomiting.  Neurological:  Negative for dizziness, seizures  and headaches.  Psychiatric/Behavioral:  Negative for depression, hallucinations, memory loss, substance abuse and suicidal ideas. The patient has insomnia. The patient is not nervous/anxious.   All other systems reviewed and are negative.  Vitals: Blood pressure 119/85, pulse 95, temperature 98 F (36.7 C), resp. rate 18, SpO2 100 %, unknown if currently breastfeeding. There is no height or weight on file to calculate BMI.  Musculoskeletal: Strength & Muscle Tone: within normal limits Gait & Station: normal Patient leans: N/A   [facility] MSE Discharge Disposition for Follow up and Recommendations: Based on my evaluation the patient does not appear to have an emergency medical condition and can be discharged with resources and follow up care in outpatient services for Medication Management and Individual Therapy and psychotropic medication prescription (see details below).  Patient denies SI, HI, AVH, or paranoia on exam.  Patient is not psychotic on exam.  Patient is not an imminent risk/threat to herself or others at this time.  Patient verbally contracts for safety.  Patient does not meet inpatient psychiatric treatment criteria, [facility] continues assessment criteria, or [facility] facility based crisis [facility]) criteria at this time.  Patient is inquiring about obtaining refills for her gabapentin and Seroquel prescriptions to serve as a bridge until she can follow up with her outpatient psychiatric provider on 03/25/2021 at [facility] [place].  Based on patient's history of substance abuse and potential addictive properties of gabapentin, I do not feel comfortable with refilling patient's gabapentin at this time due to my inability to follow up with the patient on an outpatient basis.  I notified patient that I would not be refilling her gabapentin and she expressed understanding of this.  However, I do feel comfortable with providing a refill for patient's Seroquel 100 mg p.o. nightly  prescription to serve as a bridge for her until she can follow up with her outpatient psychiatric provider on 03/25/2021 at [facility] [place]. E-prescription for refill of patient's Seroquel 100 mg p.o. daily at bedtime (15 tablets, 0 refills) for bipolar disorder/mood stability sent to patient's pharmacy of choice.  Recommend that patient continue to follow up with her therapy sessions with [facility] [place] and recommend that patient attend her 03/25/2021 [facility] [place] psychiatry appointment to discuss further adjustments to her psychotropic medication regimen as necessary as well as to establish regular outpatient follow-up with outpatient psychiatric provider for continued psychotropic medication management.  Patient is agreeable to this plan.  Address and contact information for [facility] location provided to patient in AVS as well as in list of additional outpatient psychiatry [facility] resources that patient can utilize in the case that she is unable to continue follow-up with [facility] [place].  Safety planning done at length with the patient regarding appropriate actions to take/resources to utilize [facility] nearest ED, 911, suicide prevention [facility]) if the patient becomes suicidal or homicidal,  if the patient's condition rapidly deteriorates/worsen/does not improve, or if the patient begins to experience a mental health crisis.  Patient verbalizes understanding and agreement of the overall plan and recommendations, including safety plan. All patient's questions answered and concerns addressed. Patient discharged home.   Jaclyn Shaggy, PA-C 03/10/2021, 12:11 AM

## 2021-03-11 ENCOUNTER — Telehealth (HOSPITAL_COMMUNITY): Payer: Self-pay

## 2021-03-11 NOTE — BH Assessment (Signed)
Care Management - Follow Up [facility] Discharges   Writer attempted to make contact with patient today and was unsuccessful.  Writer left a HIPPA compliant voice message.   Per chart review, patient will follow up with her established Therapist at [facility] the [place] on 03/25/2021.

## 2021-09-21 ENCOUNTER — Ambulatory Visit: Payer: Medicaid Other | Admitting: Family

## 2021-09-21 ENCOUNTER — Other Ambulatory Visit: Payer: Self-pay | Admitting: *Deleted

## 2021-09-21 ENCOUNTER — Encounter: Payer: Self-pay | Admitting: *Deleted

## 2021-09-23 ENCOUNTER — Encounter: Payer: Self-pay | Admitting: Psychiatry

## 2021-09-23 ENCOUNTER — Ambulatory Visit (INDEPENDENT_AMBULATORY_CARE_PROVIDER_SITE_OTHER): Payer: Medicaid Other | Admitting: Psychiatry

## 2021-09-23 VITALS — BP 132/94 | HR 104 | Ht 63.0 in | Wt 258.0 lb

## 2021-09-23 DIAGNOSIS — G43019 Migraine without aura, intractable, without status migrainosus: Secondary | ICD-10-CM | POA: Diagnosis not present

## 2021-09-23 DIAGNOSIS — R200 Anesthesia of skin: Secondary | ICD-10-CM

## 2021-09-23 DIAGNOSIS — R519 Headache, unspecified: Secondary | ICD-10-CM

## 2021-09-23 MED ORDER — UBRELVY 100 MG PO TABS: 100.0000 mg | ORAL_TABLET | ORAL | 3 refills | Status: DC | PRN

## 2021-09-23 MED ORDER — LORAZEPAM 0.5 MG PO TABS: ORAL_TABLET | ORAL | 0 refills | Status: DC

## 2021-09-23 NOTE — Progress Notes (Signed)
Referring:  Ricard Dillon, NP [address] [place],  [place] [address]  PCP: Ricard Dillon, NP  Neurology was asked to evaluate Faith Beck, a 31 year old female for a chief complaint of headaches.  Our recommendations of care will be communicated by shared medical record.    CC:  headaches  History provided from self  HPI:  Medical co-morbidities: kidney stones, bipolar disorder  The patient presents for evaluation of headaches. She has a history of headaches since she was a teenager, but they have become daily in the past 6-8 months. Headaches are described as unilateral (R>L) throbbing and stabbing with associated photophobia, nausea, and vomiting. They can last all day.  She previously tried Imitrex and Nurtec for rescue but these were ineffective. Currently she takes Ubrelvy 50 mg PRN which helps some of the time, but sometimes it does not touch her headache. She currently takes OTCs daily.  Headache History: Onset: teenager, became daily 6-8 months ago Triggers: none Aura: blurry Location: right>left unilateral Quality/Description: stabbing, pounding Associated Symptoms:  Photophobia: yes  Phonophobia: no  Nausea: yes Vomiting: yes Other symptoms: dizziness Worse with activity?: yes Duration of headaches: up to 24 hours  Headache days per month: 30 Headache free days per month: 0  Current Treatment: Abortive Ubrelvy 50 mg PRN   Preventative none  Prior Therapies                                  Prevention: Wellbutrin Buspirone Gabapentin 300 mg TID Depakote 250 mg BID Cymbalta 30 mg daily Lexapro 10 mg daily Occipital nerve blocks - helped Topamax contraindicated due to kidney stones  Rescue: Ubrelvy 50 mg PRN Imitrex 50 mg PRN - lack of efficacy Nurtec 75 mg PRN - lack of efficacy Toradol Seroquel 100 mg QHS Mobic 15 mg PRN Zofran 4 mg PRN Flexeril 5 mg PRN Robaxin 500 mg PRN Baclofen 5 mg PRN  LABS: CBC    Component Value  Date/Time   WBC 7.4 02/12/2018 2053   RBC 4.85 02/12/2018 2053   HGB 13.9 02/12/2018 2053   HGB 13.1 10/06/2017 1611   HCT 41.7 02/12/2018 2053   HCT 40.7 10/06/2017 1611   PLT 216 02/12/2018 2053   PLT 259 10/06/2017 1611   MCV 86.0 02/12/2018 2053   MCV 87 10/06/2017 1611   MCH 28.7 02/12/2018 2053   MCHC 33.3 02/12/2018 2053   RDW 14.3 02/12/2018 2053   RDW 15.2 10/06/2017 1611   LYMPHSABS 0.7 02/12/2018 2053   LYMPHSABS 2.2 10/06/2017 1611   MONOABS 0.7 02/12/2018 2053   EOSABS 0.1 02/12/2018 2053   EOSABS 0.7 (H) 10/06/2017 1611   BASOSABS 0.0 02/12/2018 2053   BASOSABS 0.0 10/06/2017 1611      Latest Ref Rng & Units 02/12/2018    8:53 PM 01/05/2018    1:52 AM 10/06/2017    4:11 PM  CMP  Glucose 70 - 99 mg/dL 97   77   94    BUN 6 - 20 mg/dL 12   17   11     Creatinine 0.44 - 1.00 mg/dL 0.89   0.84   0.77    Sodium 135 - 145 mmol/L 139   143   140    Potassium 3.5 - 5.1 mmol/L 3.4   3.6   3.8    Chloride 98 - 111 mmol/L 106   109   106  CO2 22 - 32 mmol/L 22   25   22     Calcium 8.9 - 10.3 mg/dL 9.2   9.2   9.5    Total Protein 6.5 - 8.1 g/dL 7.4   6.6   6.4    Total Bilirubin 0.3 - 1.2 mg/dL 0.6   1.2   0.3    Alkaline Phos 38 - 126 U/L 66   65   72    AST 15 - 41 U/L 18   26   17     ALT 0 - 44 U/L 13   12   10        IMAGING:  none   Current Outpatient Medications on File Prior to Visit  Medication Sig Dispense Refill   buPROPion (WELLBUTRIN SR) 150 MG 12 hr tablet Take 150 mg by mouth 2 (two) times daily.     busPIRone (BUSPAR) 10 MG tablet Take 10 mg by mouth 2 (two) times daily.     cetirizine (ZYRTEC) 10 MG tablet Take 10 mg by mouth daily as needed for allergies.     Cholecalciferol (VITAMIN D3) 1.25 MG (50000 UT) CAPS Take by mouth.     gabapentin (NEURONTIN) 300 MG capsule Take 300 mg by mouth 3 (three) times daily.     omeprazole (PRILOSEC) 20 MG capsule Take 20 mg by mouth daily.     VENTOLIN HFA 108 (90 Base) MCG/ACT inhaler INHALE 2 PUFFS INTO  THE LUNGS EVERY 6 HOURS AS NEEDED FOR WHEEZING OR SHORTNESS OF BREATH 18 g 2   VISTARIL 50 MG capsule Take 50 mg by mouth 2 (two) times daily as needed.     No current facility-administered medications on file prior to visit.     Allergies: No Known Allergies  Family History: Migraine or other headaches in the family:  brother Aneurysms in a first degree relative:  none Brain tumors in the family:  none Other neurological illness in the family:   none  Past Medical History: 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[place])    Migraine without status migrainosus     Past Surgical History Past Surgical History:  Procedure Laterality Date   CESAREAN SECTION     x3   CHOLECYSTECTOMY N/A 02/19/2015   Procedure: LAPAROSCOPIC CHOLECYSTECTOMY WITH INTRAOPERATIVE CHOLANGIOGRAM;  Surgeon: Armandina Gemma, MD;  Location: [facility];  Service: General;  Laterality: N/A;   DILATION AND CURETTAGE OF UTERUS     TUBAL LIGATION      Social History: Social History   Tobacco Use   Smoking status: Former    Types: Cigarettes    Quit date: 06/11/2015    Years since quitting: 6.2   Smokeless tobacco: Never  Vaping Use   Vaping Use: Never used  Substance Use Topics   Alcohol use: No    Comment: occas   Drug use: Yes    Types: Marijuana    ROS: Negative for fevers, chills. Positive for headaches. All other systems reviewed and negative unless stated otherwise in HPI.   Physical Exam:   Vital Signs: BP (!) 132/94   Pulse (!) 104   Ht 5\' 3"  (1.6 m)   Wt 258 lb (117 kg)   LMP 05/23/2018   BMI 45.70 kg/m  GENERAL: well appearing,in no acute distress,alert SKIN:  Color, texture, turgor normal.  No rashes or lesions HEAD:  Normocephalic/atraumatic. CV:  RRR RESP: Normal respiratory effort MSK: +tenderness to palpation over bilateral occiput, neck, and  shoulders  NEUROLOGICAL: Mental Status: Alert, oriented to person, place and time,Follows commands Cranial Nerves: PERRL, visual fields intact to confrontation, extraocular movements intact, diminished sensation over left V1-3, no facial droop or ptosis, hearing grossly intact, no dysarthria Motor: muscle strength 5/5 both upper and lower extremities Reflexes: 2+ throughout Sensation: Diminished sensation to light touch over LUE and LLE Coordination: Finger-to- nose-finger intact bilaterally Gait: normal-based   IMPRESSION: 31 year old female with a history of kidney stones, bipolar disorder who presents for evaluation of worsening migraines over the past 6-8 months. Will order MRI brain as neurological exam is significant for diminished sensation over her left hemibody. Suspect she may have a component of medication overuse headache in the setting of daily OTC use. Counseled on limiting OTCs to 2 days per week to avoid rebound headaches. She has failed multiple preventive medications and cannot take Topamax due to kidney stones. Will start Botox for migraine prevention and increase Ubrelvy to 100 mg PRN.   PLAN: -MRI brain with contrast -Preventive: Start Botox -Rescue: Increase Ubrelvy to 100 mg PRN -next steps: consider neck PT, CGRP, propranolol (pt would prefer to avoid taking another pill if possible). Consider ODT or nasal spray triptan for rescue  I spent a total of 38 minutes chart reviewing and counseling the patient. Headache education was done. Discussed treatment options including preventive and acute medications. Discussed medication overuse headache and to limit use of acute treatments to no more than 2 days/week or 10 days/month. Discussed medication side effects, adverse reactions and drug interactions. Written educational materials and patient instructions outlining all of the above were given.  Follow-up: for Botox   Genia Harold, MD 09/23/2021   3:24 PM

## 2021-09-23 NOTE — Patient Instructions (Addendum)
MRI brain Start Botox for migraine prevention Increase Ubrelvy to 100 mg as needed. You can repeat a dose in 2 hours if headache persists. Try to limit use of over the counter medication to 2 days per week to avoid rebound headache

## 2021-09-28 ENCOUNTER — Telehealth: Payer: Self-pay | Admitting: Psychiatry

## 2021-09-28 NOTE — Telephone Encounter (Signed)
medicaid NPR sent to GI they will call the patient to schedule 

## 2021-09-30 ENCOUNTER — Telehealth: Payer: Self-pay | Admitting: Psychiatry

## 2021-09-30 ENCOUNTER — Ambulatory Visit (INDEPENDENT_AMBULATORY_CARE_PROVIDER_SITE_OTHER): Payer: Medicaid Other | Admitting: Orthopedic Surgery

## 2021-09-30 DIAGNOSIS — M545 Low back pain, unspecified: Secondary | ICD-10-CM

## 2021-09-30 MED ORDER — PREDNISONE 10 MG PO TABS: 10.0000 mg | ORAL_TABLET | Freq: Every day | ORAL | 0 refills | Status: DC

## 2021-09-30 NOTE — Telephone Encounter (Signed)
Completed Botox PA, placed in Nurse Pod for MD signature.

## 2021-10-05 NOTE — Telephone Encounter (Signed)
Faxed signed PA form with OV notes to  Medicaid. ? ?

## 2021-10-07 ENCOUNTER — Ambulatory Visit: Payer: Medicaid Other | Admitting: Psychiatry

## 2021-10-07 ENCOUNTER — Encounter: Payer: Self-pay | Admitting: Orthopedic Surgery

## 2021-10-07 NOTE — Progress Notes (Signed)
Office Visit Note   Patient: Faith Beck           Date of Birth: 03/07/1991           MRN: [id number] Visit Date: 09/30/2021              Requested by: Lars Mage, NP [address] [place],  [place] [address] PCP: Lars Mage, NP  Chief Complaint  Patient presents with   Lower Back - Pain      HPI: Patient is a 31 year old woman who presents with acute lower back pain that radiates from midline to the buttocks bilaterally.  Patient complains of still pain with standing.  Pain with leaning over to wash dishes.  Assessment & Plan: Visit Diagnoses:  1. Acute bilateral low back pain without sciatica     Plan: Recommended prednisone and physical therapy  Follow-Up Instructions: Return in about 4 weeks (around 10/28/2021).   Ortho Exam  Patient is alert, oriented, no adenopathy, well-dressed, normal affect, normal respiratory effort. Examination patient has difficulty getting from a sitting to standing position.  She has a negative straight leg raise bilaterally no focal motor weakness.  Review of her old radiographs of the lumbar spine shows no pars defect no joint space narrowing no osteophytic bone spurs.  Imaging: No results found. No images are attached to the encounter.  Labs: Lab Results  Component Value Date   REPTSTATUS 02/12/2018 FINAL 02/10/2018   CULT >=100,000 COLONIES/mL KLEBSIELLA PNEUMONIAE (A) 02/10/2018   LABORGA KLEBSIELLA PNEUMONIAE (A) 02/10/2018     Lab Results  Component Value Date   ALBUMIN 4.0 02/12/2018   ALBUMIN 3.9 01/05/2018   ALBUMIN 4.1 10/06/2017    No results found for: MG No results found for: VD25OH  No results found for: PREALBUMIN    Latest Ref Rng & Units 02/12/2018    8:53 PM 01/05/2018    3:46 AM 10/06/2017    4:11 PM  CBC EXTENDED  WBC 4.0 - 10.5 K/uL 7.4   8.2   7.9    RBC 3.87 - 5.11 MIL/uL 4.85   4.37   4.68    Hemoglobin 12.0 - 15.0 g/dL 93.8   18.2   99.3    HCT 36.0 - 46.0 % 41.7   38.3   40.7     Platelets 150 - 400 K/uL 216   211   259    NEUT# 1.7 - 7.7 K/uL 5.9    4.4    Lymph# 0.7 - 4.0 K/uL 0.7    2.2       There is no height or weight on file to calculate BMI.  Orders:  No orders of the defined types were placed in this encounter.  Meds ordered this encounter  Medications   predniSONE (DELTASONE) 10 MG tablet    Sig: Take 1 tablet (10 mg total) by mouth daily with breakfast.    Dispense:  30 tablet    Refill:  0     Procedures: No procedures performed  Clinical Data: No additional findings.  ROS:  All other systems negative, except as noted in the HPI. Review of Systems  Objective: Vital Signs: LMP 05/23/2018   Specialty Comments:  No specialty comments available.  PMFS History: Patient Active Problem List   Diagnosis Date Noted   Bipolar affective disorder, currently depressed, mild (HCC) 08/01/2018   Chronic migraine without aura without status migrainosus, not intractable 08/01/2018   Mild intermittent asthma with acute exacerbation 01/10/2018  Kidney stones 01/10/2018   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)    Migraine without status migrainosus     Family History  Problem Relation Age of Onset   Asthma Mother    COPD Mother    Fibromyalgia Mother    Hypertension Father    Cancer Father        leukemia   Diabetes Father    Heart disease Father    Pancreatic cancer Father    Migraines Brother    Asthma Daughter    Healthy Son     Past Surgical History:  Procedure Laterality Date   CESAREAN SECTION     x3   CHOLECYSTECTOMY N/A 02/19/2015   Procedure: LAPAROSCOPIC CHOLECYSTECTOMY WITH INTRAOPERATIVE CHOLANGIOGRAM;  Surgeon: Darnell Level, MD;  Location: [facility];  Service: General;  Laterality: N/A;   DILATION AND CURETTAGE OF UTERUS     TUBAL LIGATION     Social History   Occupational History    Not on file  Tobacco Use   Smoking status: Former    Types: Cigarettes    Quit date: 06/11/2015    Years since quitting: 6.3   Smokeless tobacco: Never  Vaping Use   Vaping Use: Never used  Substance and Sexual Activity   Alcohol use: No    Comment: occas   Drug use: Yes    Types: Marijuana   Sexual activity: Yes    Birth control/protection: None

## 2021-10-10 ENCOUNTER — Ambulatory Visit
Admission: RE | Admit: 2021-10-10 | Discharge: 2021-10-10 | Disposition: A | Discharge status: Home / Self Care | Payer: Medicaid Other | Source: Ambulatory Visit | Attending: Psychiatry | Admitting: Psychiatry

## 2021-10-10 DIAGNOSIS — R200 Anesthesia of skin: Secondary | ICD-10-CM | POA: Diagnosis not present

## 2021-10-10 DIAGNOSIS — R519 Headache, unspecified: Secondary | ICD-10-CM

## 2021-10-10 MED ORDER — GADOBENATE DIMEGLUMINE 529 MG/ML IV SOLN
20.0000 mL | Freq: Once | INTRAVENOUS | Status: AC | PRN
  Administered 2021-10-10: 20 mL via INTRAVENOUS

## 2021-10-13 MED ORDER — BOTOX 200 UNITS IJ SOLR: INTRAMUSCULAR | 2 refills | Status: DC

## 2021-10-13 NOTE — Addendum Note (Signed)
Addended by: Ann Maki on: 10/13/2021 03:01 PM   Modules accepted: Orders

## 2021-10-13 NOTE — Telephone Encounter (Signed)
Received approval from [place], [place] # [id number] (05/30-2023-04/03/2022). Please send Botox RX to [facility].

## 2021-10-13 NOTE — Telephone Encounter (Signed)
Refill has been sent.  °

## 2021-10-20 ENCOUNTER — Other Ambulatory Visit: Payer: Self-pay | Admitting: Orthopedic Surgery

## 2021-11-01 ENCOUNTER — Ambulatory Visit: Payer: Medicaid Other | Admitting: Orthopedic Surgery

## 2021-12-23 ENCOUNTER — Telehealth: Payer: Self-pay | Admitting: Psychiatry

## 2021-12-23 ENCOUNTER — Encounter: Payer: Self-pay | Admitting: Psychiatry

## 2021-12-23 NOTE — Telephone Encounter (Signed)
LVM and sent mychart msg informing pt to call back to schedule initial botox appointment 

## 2022-05-12 ENCOUNTER — Telehealth: Payer: Self-pay | Admitting: Neurology

## 2022-05-12 NOTE — Telephone Encounter (Signed)
Have been advised that the patient needs a new PA for the new year for the botox. J Code - N0539 ICD G43.719  Please renew for the new year

## 2022-05-18 ENCOUNTER — Other Ambulatory Visit (HOSPITAL_COMMUNITY): Payer: Self-pay

## 2022-05-18 NOTE — Telephone Encounter (Signed)
Patient Advocate Encounter   Received notification that prior authorization for Botox 200UNIT solution is required.   PA submitted on 05/18/2022 Key [id number] Status is pending       Lyndel Safe, [place] Patient Advocate Specialist [place] Patient Advocate Team Direct Number: [phone]  Fax: [phone]

## 2022-05-19 ENCOUNTER — Other Ambulatory Visit (HOSPITAL_COMMUNITY): Payer: Self-pay

## 2022-05-19 MED ORDER — BOTOX 200 UNITS IJ SOLR
INTRAMUSCULAR | 1 refills | Status: DC
  Filled 2022-05-19: qty 1, fill #0
  Filled 2022-05-30: qty 1, 34d supply, fill #0
  Filled 2022-08-30: qty 1, 34d supply, fill #1

## 2022-05-19 NOTE — Addendum Note (Signed)
Addended by: Darleen Crocker on: 05/19/2022 09:43 AM   Modules accepted: Orders

## 2022-05-19 NOTE — Telephone Encounter (Signed)
Pharmacy Patient Advocate Encounter  Prior Authorization for Botox 200UNIT solution has been approved.    PA# PA Case ID #: [id number] Effective dates: 05/18/2022 through 05/19/2023  Per test billing copay is $3.00  This can be filled at [facility]

## 2022-05-23 ENCOUNTER — Other Ambulatory Visit: Payer: Self-pay

## 2022-05-25 NOTE — Telephone Encounter (Signed)
LVM and sent MyChart message asking pt to call back and schedule Botox appt.

## 2022-05-30 ENCOUNTER — Other Ambulatory Visit: Payer: Self-pay

## 2022-05-30 ENCOUNTER — Other Ambulatory Visit (HOSPITAL_COMMUNITY): Payer: Self-pay

## 2022-06-02 ENCOUNTER — Other Ambulatory Visit (HOSPITAL_COMMUNITY): Payer: Self-pay

## 2022-06-03 ENCOUNTER — Other Ambulatory Visit (HOSPITAL_COMMUNITY): Payer: Self-pay

## 2022-06-06 ENCOUNTER — Other Ambulatory Visit (HOSPITAL_COMMUNITY): Payer: Self-pay

## 2022-06-07 ENCOUNTER — Other Ambulatory Visit: Payer: Self-pay

## 2022-06-07 ENCOUNTER — Other Ambulatory Visit (HOSPITAL_COMMUNITY): Payer: Self-pay

## 2022-06-15 ENCOUNTER — Ambulatory Visit: Payer: Medicaid Other | Admitting: Physician Assistant

## 2022-06-15 ENCOUNTER — Telehealth: Payer: Self-pay | Admitting: Physician Assistant

## 2022-06-15 NOTE — Telephone Encounter (Signed)
Good morning Faith Beck,   Patient called stating she would not be able to come to her appointment with you this morning at 9:30 due to testing positive for strep throat yesterday.    Patient was rescheduled for 3/13 at 10:30

## 2022-06-16 ENCOUNTER — Ambulatory Visit (INDEPENDENT_AMBULATORY_CARE_PROVIDER_SITE_OTHER): Payer: Medicaid Other | Admitting: Adult Health

## 2022-06-16 ENCOUNTER — Encounter: Payer: Self-pay | Admitting: Adult Health

## 2022-06-16 ENCOUNTER — Ambulatory Visit (INDEPENDENT_AMBULATORY_CARE_PROVIDER_SITE_OTHER): Payer: Medicaid Other | Admitting: Physician Assistant

## 2022-06-16 ENCOUNTER — Encounter: Payer: Self-pay | Admitting: Neurology

## 2022-06-16 ENCOUNTER — Encounter: Payer: Self-pay | Admitting: Physician Assistant

## 2022-06-16 VITALS — BP 124/78 | HR 91 | Temp 97.7°F | Resp 16 | Ht 63.0 in | Wt 240.8 lb

## 2022-06-16 DIAGNOSIS — M549 Dorsalgia, unspecified: Secondary | ICD-10-CM | POA: Diagnosis not present

## 2022-06-16 DIAGNOSIS — F419 Anxiety disorder, unspecified: Secondary | ICD-10-CM | POA: Diagnosis not present

## 2022-06-16 DIAGNOSIS — F32A Depression, unspecified: Secondary | ICD-10-CM | POA: Insufficient documentation

## 2022-06-16 DIAGNOSIS — M5441 Lumbago with sciatica, right side: Secondary | ICD-10-CM

## 2022-06-16 DIAGNOSIS — G43019 Migraine without aura, intractable, without status migrainosus: Secondary | ICD-10-CM

## 2022-06-16 DIAGNOSIS — R519 Headache, unspecified: Secondary | ICD-10-CM

## 2022-06-16 DIAGNOSIS — N62 Hypertrophy of breast: Secondary | ICD-10-CM | POA: Diagnosis not present

## 2022-06-16 DIAGNOSIS — B9689 Other specified bacterial agents as the cause of diseases classified elsewhere: Secondary | ICD-10-CM

## 2022-06-16 DIAGNOSIS — G8929 Other chronic pain: Secondary | ICD-10-CM

## 2022-06-16 DIAGNOSIS — L03115 Cellulitis of right lower limb: Secondary | ICD-10-CM | POA: Insufficient documentation

## 2022-06-16 DIAGNOSIS — N760 Acute vaginitis: Secondary | ICD-10-CM | POA: Insufficient documentation

## 2022-06-16 MED ORDER — DOXYCYCLINE HYCLATE 100 MG PO TABS: 100.0000 mg | ORAL_TABLET | Freq: Two times a day (BID) | ORAL | 0 refills | Status: DC

## 2022-06-16 MED ORDER — ONABOTULINUMTOXINA 200 UNITS IJ SOLR
155.0000 [IU] | Freq: Once | INTRAMUSCULAR | Status: AC
  Administered 2022-06-16: 155 [IU] via INTRAMUSCULAR

## 2022-06-16 MED ORDER — BUPROPION HCL ER (SR) 150 MG PO TB12: 150.0000 mg | ORAL_TABLET | Freq: Two times a day (BID) | ORAL | 1 refills | Status: DC

## 2022-06-16 MED ORDER — METRONIDAZOLE 500 MG PO TABS: 500.0000 mg | ORAL_TABLET | Freq: Two times a day (BID) | ORAL | 0 refills | Status: DC

## 2022-06-16 MED ORDER — BUSPIRONE HCL 10 MG PO TABS: 10.0000 mg | ORAL_TABLET | Freq: Two times a day (BID) | ORAL | 1 refills | Status: AC

## 2022-06-16 NOTE — Progress Notes (Signed)
Subjective:    Patient ID: Faith Beck, female    DOB: 07-03-1990, 32 y.o.   MRN: [id number]  Chief Complaint  Patient presents with   New Patient (Initial Visit)   Cyst    Inside of thighs, hurts to wash and wear underwear   Shoulder Pain    right   Hip Pain    Bilateral     HPI 32 y.o. patient presents today for new patient establishment with me.   Current Care Team: Dr. Billey Gosling - neurology - Botox first round this morning    Acute Concerns: Vaginal discharge - some itching and odor, some burning, white; would like to  have BV treatment as she has had this several times and is having same symptoms. No new sexual partners. Not worried about STDs.   Right scapular pain and low back pain - thinks this may be secondary to her breast size - G-cups, special orders her bras. Chronic indents in her shoulders.   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[place])    Migraine without status migrainosus     Past Surgical History:  Procedure Laterality Date   ABDOMINAL HYSTERECTOMY     still has L ovary   CESAREAN SECTION     x3   CHOLECYSTECTOMY N/A 02/19/2015   Procedure: LAPAROSCOPIC CHOLECYSTECTOMY WITH INTRAOPERATIVE CHOLANGIOGRAM;  Surgeon: Armandina Gemma, MD;  Location: [facility];  Service: General;  Laterality: N/A;   DILATION AND CURETTAGE OF UTERUS     TUBAL LIGATION      Family History  Problem Relation Age of Onset   Asthma Mother    COPD Mother    Fibromyalgia Mother    Hypertension Father    Cancer Father        leukemia   Diabetes Father    Heart disease Father    Pancreatic cancer Father    Migraines Brother    Asthma Daughter    Healthy Son     Social History   Tobacco Use   Smoking status: Former    Types: Cigarettes    Quit date: 06/11/2015    Years since  quitting: 7.0   Smokeless tobacco: Never  Vaping Use   Vaping Use: Never used  Substance Use Topics   Alcohol use: No    Comment: occas   Drug use: Yes    Types: Marijuana     No Known Allergies  Review of Systems NEGATIVE UNLESS OTHERWISE INDICATED IN HPI      Objective:     BP 124/78   Pulse 91   Temp 97.7 F (36.5 C) (Temporal)   Resp 16   Ht 5' 3"$  (1.6 m)   Wt 240 lb 12.8 oz (109.2 kg)   LMP 05/23/2018   SpO2 100%   BMI 42.66 kg/m   Wt Readings from Last 3 Encounters:  06/16/22 240 lb 12.8 oz (109.2 kg)  09/23/21 258 lb (117 kg)  04/24/18 217 lb 8 oz (98.7 kg)    BP Readings from Last 3 Encounters:  06/16/22 124/78  09/23/21 (!) 132/94  05/24/18 119/80     Physical Exam Vitals and nursing note reviewed.  Constitutional:      Appearance: Normal appearance. She is morbidly obese. She  is not toxic-appearing.  HENT:     Head: Normocephalic and atraumatic.  Eyes:     Extraocular Movements: Extraocular movements intact.     Conjunctiva/sclera: Conjunctivae normal.     Pupils: Pupils are equal, round, and reactive to light.  Cardiovascular:     Rate and Rhythm: Normal rate and regular rhythm.     Pulses: Normal pulses.     Heart sounds: Normal heart sounds.  Pulmonary:     Effort: Pulmonary effort is normal.     Breath sounds: Normal breath sounds.  Musculoskeletal:        General: Normal range of motion.     Cervical back: Normal range of motion and neck supple.  Skin:    General: Skin is warm and dry.     Findings: Lesion (R inner thigh area of cellulitis) present.  Neurological:     General: No focal deficit present.     Mental Status: She is alert and oriented to person, place, and time.  Psychiatric:        Mood and Affect: Mood normal.        Behavior: Behavior normal.        Assessment & Plan:  Chronic bilateral low back pain with right-sided sciatica -     Ambulatory referral to Plastic Surgery  Chronic upper back pain -      Ambulatory referral to Plastic Surgery  Large breasts Assessment & Plan: Referral to plastics to discuss reduction   Orders: -     Ambulatory referral to Plastic Surgery  Anxiety and depression Assessment & Plan: Stable Wellbutrin SR 150 mg BID; Buspar 10 mg BID; refilled today    Bacterial vaginosis Assessment & Plan: Recurrent per patient; deferred testing today; treat with metronidazole 500 mg BID, no ETOH with this med   Cellulitis of right thigh Assessment & Plan: Use Hibiclens solution or Dial gold bar soap daily to help with skin concerns. Doxycycline as directed. Pt aware of risks vs benefits and possible adverse reactions.    Other orders -     buPROPion HCl ER (SR); Take 1 tablet (150 mg total) by mouth 2 (two) times daily.  Dispense: 180 tablet; Refill: 1 -     busPIRone HCl; Take 1 tablet (10 mg total) by mouth 2 (two) times daily.  Dispense: 180 tablet; Refill: 1 -     Doxycycline Hyclate; Take 1 tablet (100 mg total) by mouth 2 (two) times daily for 10 days.  Dispense: 20 tablet; Refill: 0 -     metroNIDAZOLE; Take 1 tablet (500 mg total) by mouth 2 (two) times daily. Do not use alcohol while taking this medication.  Dispense: 14 tablet; Refill: 0        Return in about 3 months (around 09/14/2022) for CPE, fasting labs .     Hatcher Froning M Isahi Godwin, PA-C

## 2022-06-16 NOTE — Progress Notes (Signed)
Update 06/16/2022 JM: Patient is being seen for initial Botox visit.  She continues to have daily migraine type headaches. Does report benefit with higher dose of Ubrelvy, at times may need to take second dose.  Tolerated procedure well today. Will return in 3 months for repeat injection.     Consent Form Botulism Toxin Injection For Chronic Migraine    Reviewed orally with patient, additionally signature is on file:  Botulism toxin has been approved by the Federal drug administration for treatment of chronic migraine. Botulism toxin does not cure chronic migraine and it may not be effective in some patients.  The administration of botulism toxin is accomplished by injecting a small amount of toxin into the muscles of the neck and head. Dosage must be titrated for each individual. Any benefits resulting from botulism toxin tend to wear off after 3 months with a repeat injection required if benefit is to be maintained. Injections are usually done every 3-4 months with maximum effect peak achieved by about 2 or 3 weeks. Botulism toxin is expensive and you should be sure of what costs you will incur resulting from the injection.  The side effects of botulism toxin use for chronic migraine may include:   -Transient, and usually mild, facial weakness with facial injections  -Transient, and usually mild, head or neck weakness with head/neck injections  -Reduction or loss of forehead facial animation due to forehead muscle weakness  -Eyelid drooping  -Dry eye  -Pain at the site of injection or bruising at the site of injection  -Double vision  -Potential unknown long term risks   Contraindications: You should not have Botox if you are pregnant, nursing, allergic to albumin, have an infection, skin condition, or muscle weakness at the site of the injection, or have myasthenia gravis, Lambert-Eaton syndrome, or ALS.  It is also possible that as with any injection, there may be an allergic  reaction or no effect from the medication. Reduced effectiveness after repeated injections is sometimes seen and rarely infection at the injection site may occur. All care will be taken to prevent these side effects. If therapy is given over a long time, atrophy and wasting in the muscle injected may occur. Occasionally the patient's become refractory to treatment because they develop antibodies to the toxin. In this event, therapy needs to be modified.  I have read the above information and consent to the administration of botulism toxin.    BOTOX PROCEDURE NOTE FOR MIGRAINE HEADACHE  Contraindications and precautions discussed with patient(above). Aseptic procedure was observed and patient tolerated procedure. Procedure performed by Frann Rider, AGNP-BC.   The condition has existed for more than 6 months, and pt does not have a diagnosis of ALS, Myasthenia Gravis or Lambert-Eaton Syndrome.  Risks and benefits of injections discussed and pt agrees to proceed with the procedure.  Written consent obtained  These injections are medically necessary. Pt  receives good benefits from these injections. These injections do not cause sedations or hallucinations which the oral therapies may cause.   Description of procedure:  The patient was placed in a sitting position. The standard protocol was used for Botox as follows, with 5 units of Botox injected at each site:  -Procerus muscle, midline injection  -Corrugator muscle, bilateral injection  -Frontalis muscle, bilateral injection, with 2 sites each side, medial injection was performed in the upper one third of the frontalis muscle, in the region vertical from the medial inferior edge of the superior orbital rim.  The lateral injection was again in the upper one third of the forehead vertically above the lateral limbus of the cornea, 1.5 cm lateral to the medial injection site.  -Temporalis muscle injection, 4 sites, bilaterally. The first  injection was 3 cm above the tragus of the ear, second injection site was 1.5 cm to 3 cm up from the first injection site in line with the tragus of the ear. The third injection site was 1.5-3 cm forward between the first 2 injection sites. The fourth injection site was 1.5 cm posterior to the second injection site. 5th site laterally in the temporalis  muscleat the level of the outer canthus.  -Occipitalis muscle injection, 3 sites, bilaterally. The first injection was done one half way between the occipital protuberance and the tip of the mastoid process behind the ear. The second injection site was done lateral and superior to the first, 1 fingerbreadth from the first injection. The third injection site was 1 fingerbreadth superiorly and medially from the first injection site.  -Cervical paraspinal muscle injection, 2 sites, bilaterally. The first injection site was 1 cm from the midline of the cervical spine, 3 cm inferior to the lower border of the occipital protuberance. The second injection site was 1.5 cm superiorly and laterally to the first injection site.  -Trapezius muscle injection was performed at 3 sites, bilaterally. The first injection site was in the upper trapezius muscle halfway between the inflection point of the neck, and the acromion. The second injection site was one half way between the acromion and the first injection site. The third injection was done between the first injection site and the inflection point of the neck.    A total of 200 units of Botox was prepared, 155 units of Botox was injected as documented above, any Botox not injected was wasted. The patient tolerated the procedure well, there were no complications of the above procedure.   Frann Rider, AGNP-BC  [facility] [address] [place], [place] [address]  Phone [phone] Fax [phone] Note: This document was prepared with digital dictation and possible smart  phrase technology. Any transcriptional errors that result from this process are unintentional.

## 2022-06-16 NOTE — Patient Instructions (Addendum)
Welcome to [facility]! It was a pleasure meeting you today.  As discussed, Please schedule a 3 month follow up visit for fasting labs.   Referral placed to plastic surgery to discuss possible breast reduction.  Take the doxycycline and flagyl as directed. Use Hibiclens solution or Dial gold bar soap daily to help with skin concerns.  Call sooner if any concerns.   PLEASE NOTE:  If you had any LAB tests please let us know if you have not heard back within a few days. You may see your results on MyChart before we have a chance to review them but we will give you a call once they are reviewed by [place]. If we ordered any REFERRALS today, please let us know if you have not heard from their office within the next two weeks. Let us know through MyChart if you are needing REFILLS, or have your pharmacy send [place] the request. You can also use MyChart to communicate with me or any office staff.  Please try these tips to maintain a healthy lifestyle:  Eat most of your calories during the day when you are active. Eliminate processed foods including packaged sweets (pies, cakes, cookies), reduce intake of potatoes, white bread, white pasta, and white rice. Look for whole grain options, oat flour or almond flour.  Each meal should contain half fruits/vegetables, one quarter protein, and one quarter carbs (no bigger than a computer mouse).  Cut down on sweet beverages. This includes juice, soda, and sweet tea. Also watch fruit intake, though this is a healthier sweet option, it still contains natural sugar! Limit to 3 servings daily.  Drink at least 1 glass of water with each meal and aim for at least 8 glasses (64 ounces) per day.  Exercise at least 150 minutes every week to the best of your ability.    Take Care,  Nohea Kras, PA-C

## 2022-06-16 NOTE — Progress Notes (Signed)
Botox- 200 units x 1 vial Lot: [id number] Expiration: 10/2024 NDC: 6767-2094-70   Bacteriostatic 0.9% Sodium Chloride- 47mL total [id number] Expiration: 11/25 NDC: 76546-503-54   Dx: S56.812 SP

## 2022-06-19 NOTE — Assessment & Plan Note (Signed)
Stable Wellbutrin SR 150 mg BID; Buspar 10 mg BID; refilled today

## 2022-06-19 NOTE — Assessment & Plan Note (Signed)
Recurrent per patient; deferred testing today; treat with metronidazole 500 mg BID, no ETOH with this med

## 2022-06-19 NOTE — Assessment & Plan Note (Signed)
Referral to plastics to discuss reduction

## 2022-06-19 NOTE — Assessment & Plan Note (Signed)
Use Hibiclens solution or Dial gold bar soap daily to help with skin concerns. Doxycycline as directed. Pt aware of risks vs benefits and possible adverse reactions.

## 2022-06-21 ENCOUNTER — Encounter: Payer: Self-pay | Admitting: Physician Assistant

## 2022-06-21 ENCOUNTER — Encounter: Payer: Self-pay | Admitting: Psychiatry

## 2022-06-21 DIAGNOSIS — G43019 Migraine without aura, intractable, without status migrainosus: Secondary | ICD-10-CM

## 2022-06-21 NOTE — Telephone Encounter (Signed)
Called [facility] at [phone]. Spoke w/ tech. Confirmed PA needed. Bin: [id number] PCN: [id number] ID: [id number] L GRPVeverly Fells [id number]  Submitted urgent PA for Ubrevly on covermymeds. Key: [id number]. Waiting on determination from [employer].

## 2022-06-22 ENCOUNTER — Ambulatory Visit: Payer: Medicaid Other | Admitting: Gastroenterology

## 2022-06-22 NOTE — Telephone Encounter (Signed)
That's odd that they have approved this previously but now not? Please advise patient re: insurance requirements. Can place order for rizatriptan and if no benefit or unable to tolerate, we can revisit use of Ubrelvy. Thank you.

## 2022-06-23 MED ORDER — RIZATRIPTAN BENZOATE 10 MG PO TABS: 10.0000 mg | ORAL_TABLET | ORAL | 11 refills | Status: DC | PRN

## 2022-06-23 NOTE — Addendum Note (Signed)
Addended by: Wyvonnia Lora on: 06/23/2022 08:32 AM   Modules accepted: Orders

## 2022-06-27 ENCOUNTER — Encounter: Payer: Self-pay | Admitting: Physician Assistant

## 2022-07-20 ENCOUNTER — Encounter: Payer: Self-pay | Admitting: Plastic Surgery

## 2022-07-20 ENCOUNTER — Ambulatory Visit (INDEPENDENT_AMBULATORY_CARE_PROVIDER_SITE_OTHER): Payer: Medicaid Other | Admitting: Physician Assistant

## 2022-07-20 ENCOUNTER — Ambulatory Visit: Payer: Medicaid Other | Admitting: Plastic Surgery

## 2022-07-20 ENCOUNTER — Encounter: Payer: Self-pay | Admitting: Physician Assistant

## 2022-07-20 VITALS — BP 132/78 | HR 88 | Ht 63.0 in | Wt 243.0 lb

## 2022-07-20 VITALS — BP 135/87 | HR 87 | Ht 63.0 in | Wt 243.8 lb

## 2022-07-20 DIAGNOSIS — M542 Cervicalgia: Secondary | ICD-10-CM | POA: Diagnosis not present

## 2022-07-20 DIAGNOSIS — R1013 Epigastric pain: Secondary | ICD-10-CM

## 2022-07-20 DIAGNOSIS — R21 Rash and other nonspecific skin eruption: Secondary | ICD-10-CM

## 2022-07-20 DIAGNOSIS — R1314 Dysphagia, pharyngoesophageal phase: Secondary | ICD-10-CM | POA: Diagnosis not present

## 2022-07-20 DIAGNOSIS — K219 Gastro-esophageal reflux disease without esophagitis: Secondary | ICD-10-CM | POA: Diagnosis not present

## 2022-07-20 DIAGNOSIS — M546 Pain in thoracic spine: Secondary | ICD-10-CM | POA: Diagnosis not present

## 2022-07-20 DIAGNOSIS — G8929 Other chronic pain: Secondary | ICD-10-CM

## 2022-07-20 DIAGNOSIS — N62 Hypertrophy of breast: Secondary | ICD-10-CM

## 2022-07-20 DIAGNOSIS — Z6841 Body Mass Index (BMI) 40.0 and over, adult: Secondary | ICD-10-CM

## 2022-07-20 MED ORDER — FAMOTIDINE 40 MG PO TABS: 40.0000 mg | ORAL_TABLET | Freq: Every day | ORAL | 11 refills | Status: AC

## 2022-07-20 MED ORDER — OMEPRAZOLE 40 MG PO CPDR: 40.0000 mg | DELAYED_RELEASE_CAPSULE | Freq: Two times a day (BID) | ORAL | 11 refills | Status: DC

## 2022-07-20 NOTE — Progress Notes (Signed)
Referring Provider Allwardt, Randa Evens, PA-C [address] [place],  [place] [address]   CC:  Chief Complaint  Patient presents with   Consult           Faith Beck is an 32 y.o. female.  HPI: Faith Beck 18-year-old female who presents today for evaluation for a bilateral breast reduction.  Patient states that she has been having upper back and neck pain which is worsened over the past 2 years.  She notes grooving in her shoulders from her bra straps and has had rashes under her breast which she treats with over-the-counter antifungal treatments.  She denies any other significant breast issues.  She denies smoking and is not on blood thinners.  No Known Allergies  Outpatient Encounter Medications as of 07/20/2022  Medication Sig   botulinum toxin Type A (BOTOX) 200 units injection Provide to inject 155 units into the muscles of the head and neck every 12 weeks. Discard remainder.   buPROPion (WELLBUTRIN SR) 150 MG 12 hr tablet Take 1 tablet (150 mg total) by mouth 2 (two) times daily.   busPIRone (BUSPAR) 10 MG tablet Take 1 tablet (10 mg total) by mouth 2 (two) times daily.   cetirizine (ZYRTEC) 10 MG tablet Take 10 mg by mouth daily as needed for allergies.   Cholecalciferol (VITAMIN D3) 1.25 MG (50000 UT) CAPS Take by mouth.   gabapentin (NEURONTIN) 300 MG capsule Take 300 mg by mouth 3 (three) times daily.   omeprazole (PRILOSEC) 20 MG capsule Take 20 mg by mouth daily.   rizatriptan (MAXALT) 10 MG tablet Take 1 tablet (10 mg total) by mouth as needed for migraine. May repeat once in 2 hours if needed. No more than 2 tablets in 24 hours.   VENTOLIN HFA 108 (90 Base) MCG/ACT inhaler INHALE 2 PUFFS INTO THE LUNGS EVERY 6 HOURS AS NEEDED FOR WHEEZING OR SHORTNESS OF BREATH   VISTARIL 50 MG capsule Take 50 mg by mouth 2 (two) times daily as needed.   metroNIDAZOLE (FLAGYL) 500 MG tablet Take 1 tablet (500 mg total) by mouth 2 (two) times daily. Do not use alcohol while taking  this medication. (Patient not taking: Reported on 07/20/2022)   No facility-administered encounter medications on file as of 07/20/2022.     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[place])    Migraine without status migrainosus     Past Surgical History:  Procedure Laterality Date   ABDOMINAL HYSTERECTOMY     still has L ovary   CESAREAN SECTION     x3   CHOLECYSTECTOMY N/A 02/19/2015   Procedure: LAPAROSCOPIC CHOLECYSTECTOMY WITH INTRAOPERATIVE CHOLANGIOGRAM;  Surgeon: Armandina Gemma, MD;  Location: [facility];  Service: General;  Laterality: N/A;   DILATION AND CURETTAGE OF UTERUS     TUBAL LIGATION      Family History  Problem Relation Age of Onset   Asthma Mother    COPD Mother    Fibromyalgia Mother    Hypertension Father    Cancer Father        leukemia   Diabetes Father    Heart disease Father    Pancreatic cancer Father    Migraines Brother    Asthma Daughter    Healthy Son     Social History   Social History Narrative  Not on file     Review of Systems General: Denies fevers, chills, weight loss CV: Denies chest pain, shortness of breath, palpitations Musculoskeletal: Upper back and neck pain which the patient feels are due to the large size of her breast.  Physical Exam    07/20/2022   10:25 AM 07/20/2022    9:39 AM 06/16/2022   11:04 AM  Vitals with BMI  Height '5\' 3"'$  '5\' 3"'$  '5\' 3"'$   Weight 243 lbs 243 lbs 13 oz 240 lbs 13 oz  BMI 43.06 Q000111Q [id number]  Systolic Q000111Q A999333 A999333  Diastolic 78 87 78  Pulse 88 87 91    General:  No acute distress,  Alert and oriented, Non-Toxic, Normal speech and affect Breast: The patient has moderately large breasts which are pendulous with grade 3 ptosis.  She measures 35 cm sternal notch to nipple on the right and 36 cm on the left her fold to nipple distance is 18 cm on the right and 18 cm on the left.  She  does not have any obvious masses and there are no nipple abnormalities or obvious nipple discharge. Mammogram: Patient has not begun mammographic screening Assessment/Plan Symptomatic macromastia: The patient feels that her upper back and neck pain are due to the large size of her breast.  On examination I believe that I can remove between 600 g and 700 g per breast.  We discussed the procedure at length including the location of the incisions and the unpredictable nature of scarring.  We discussed the risks of bleeding, infection, and seroma formation.  She understands I will use drains postoperatively.  She understands that she will have a restriction of no heavy lifting greater than 20 pounds, no vigorous activity, no submerging the incisions in water for 6 weeks.  We discussed the risks of nipple loss due to nipple ischemia.  She understands that there will be changes in feeling in the nipples as well as changes in feeling in the breast skin. The patient's BMI is currently 43.  I have asked her to work on weight loss.  I would like to see her BMI down to at least 40 prior to surgery.  She and I discussed why this is including improving her risk profile for surgery and improving the risks of complications postoperatively.  She understands all of this and agrees.  She request that I submit her for consideration of a breast reduction.  Photographs were obtained today with her consent.  Faith Beck 07/20/2022, 11:14 AM

## 2022-07-20 NOTE — Patient Instructions (Addendum)
You have been scheduled for an endoscopy. Please follow written instructions given to you at your visit today. If you use inhalers (even only as needed), please bring them with you on the day of your procedure.  We have sent the following medications to your pharmacy for you to pick up at your convenience: Omeprazole 40 mg & Pepcid 40 mg  Due to recent changes in healthcare laws, you may see the results of your imaging and laboratory studies on MyChart before your provider has had a chance to review them.  We understand that in some cases there may be results that are confusing or concerning to you. Not all laboratory results come back in the same time frame and the provider may be waiting for multiple results in order to interpret others.  Please give [place] 48 hours in order for your provider to thoroughly review all the results before contacting the office for clarification of your results.    It was a pleasure to see you today!  Thank you for trusting me with your gastrointestinal care!

## 2022-07-20 NOTE — Progress Notes (Signed)
Chief Complaint: Dysphagia and GERD  Review of pertinent gastrointestinal problems: 1. Severe reflux esophagitis: Presented with nausea, vomiting, weight loss, EGD October 2019, Dr. Ardis Hughs found severe reflux type esophagitis, suggestion of possible Barrett's.  Multiple biopsies were taken from her esophagus, the GE junction, the stomach.  She had no intestinal metaplasia, no H. pylori, just acid related damage.     Old Data Reviewed: CT scan abd, pelvis with IV and oral constrast 01/2017: Small esophageal hiatal hernia. Prominent lymph node adjacent to the distal esophagus measures 1.4 cm in diameter. This was present on the previous studies without interval change. CT scan renal stone study (without IV contrast) October 2019 : slight thickening of the included distal esophagus with stablemildly enlarged paraesophageal lymph node measuring 13 mm short axis. Question esophageal inflammation.   HPI:    Faith Beck is a 32 year old female with a past medical history as listed below, previously known to Dr. Ardis Hughs, who was referred to me by Lupe Carney B, NP for a complaint of GERD and dysphagia.      04/24/2018 office visit with Dr. Ardis Hughs and at that time doing well on twice daily PPI and Pepcid at night.    Today, the patient presents to clinic and tells me that she is just using over-the-counter Omeprazole over the past few years, all of her problems have returned.  Tells me she lost insurance for a while so she was unable to keep up with her GI issues.  Now continues with epigastric pain which is constant worse with eating and trouble swallowing over the past year.  Tells me typically she can either make yourself vomit if something is stuck in her throat or eventually she will swallow it down.  Apparently had an episode over New Year's where a small piece of steak got stuck and it took her 24 hours to get it down.  It does sound like a true obstruction at that time unable to drink liquids  excetra.  Also with reflux.    Denies fever, chills, change in bowel habits or symptoms that awaken her from sleep.  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[place])    Migraine without status migrainosus     Past Surgical History:  Procedure Laterality Date   ABDOMINAL HYSTERECTOMY     still has L ovary   CESAREAN SECTION     x3   CHOLECYSTECTOMY N/A 02/19/2015   Procedure: LAPAROSCOPIC CHOLECYSTECTOMY WITH INTRAOPERATIVE CHOLANGIOGRAM;  Surgeon: Armandina Gemma, MD;  Location: [facility];  Service: General;  Laterality: N/A;   DILATION AND CURETTAGE OF UTERUS     TUBAL LIGATION      Current Outpatient Medications  Medication Sig Dispense Refill   botulinum toxin Type A (BOTOX) 200 units injection Provide to inject 155 units into the muscles of the head and neck every 12 weeks. Discard remainder. 1 each 1   buPROPion (WELLBUTRIN SR) 150 MG 12 hr tablet Take 1 tablet (150 mg total) by mouth 2 (two) times daily. 180 tablet 1   busPIRone (BUSPAR) 10 MG tablet Take 1 tablet (10 mg total) by mouth 2 (two) times daily. 180 tablet 1   cetirizine (ZYRTEC) 10 MG tablet Take 10 mg by mouth daily as needed for allergies.     Cholecalciferol (VITAMIN D3) 1.25 MG (50000 UT)  CAPS Take by mouth.     gabapentin (NEURONTIN) 300 MG capsule Take 300 mg by mouth 3 (three) times daily.     omeprazole (PRILOSEC) 20 MG capsule Take 20 mg by mouth daily.     rizatriptan (MAXALT) 10 MG tablet Take 1 tablet (10 mg total) by mouth as needed for migraine. May repeat once in 2 hours if needed. No more than 2 tablets in 24 hours. 10 tablet 11   VENTOLIN HFA 108 (90 Base) MCG/ACT inhaler INHALE 2 PUFFS INTO THE LUNGS EVERY 6 HOURS AS NEEDED FOR WHEEZING OR SHORTNESS OF BREATH 18 g 2   VISTARIL 50 MG capsule Take 50 mg by mouth 2 (two) times daily as needed.     metroNIDAZOLE (FLAGYL) 500 MG tablet Take  1 tablet (500 mg total) by mouth 2 (two) times daily. Do not use alcohol while taking this medication. (Patient not taking: Reported on 07/20/2022) 14 tablet 0   No current facility-administered medications for this visit.    Allergies as of 07/20/2022   (No Known Allergies)    Family History  Problem Relation Age of Onset   Asthma Mother    COPD Mother    Fibromyalgia Mother    Hypertension Father    Cancer Father        leukemia   Diabetes Father    Heart disease Father    Pancreatic cancer Father    Migraines Brother    Asthma Daughter    Healthy Son     Social History   Socioeconomic History   Marital status: Married    Spouse name: Not on file   Number of children: Not on file   Years of education: Not on file   Highest education level: Not on file  Occupational History   Not on file  Tobacco Use   Smoking status: Former    Types: Cigarettes    Quit date: 06/11/2015    Years since quitting: 7.1   Smokeless tobacco: Never  Vaping Use   Vaping Use: Never used  Substance and Sexual Activity   Alcohol use: No    Comment: occas   Drug use: Yes    Types: Marijuana   Sexual activity: Yes    Birth control/protection: None  Other Topics Concern   Not on file  Social History Narrative   Not on file   Social Determinants of Health   Financial Resource Strain: Not on file  Food Insecurity: Not on file  Transportation Needs: Not on file  Physical Activity: Not on file  Stress: Not on file  Social Connections: Not on file  Intimate Partner Violence: Not on file    Review of Systems:    Constitutional: No weight loss, fever or chills Skin: No rash  Cardiovascular: No chest pain Respiratory: No SOB  Gastrointestinal: See HPI and otherwise negative Genitourinary: No dysuria  Neurological: No headache, dizziness or syncope Musculoskeletal: No new muscle or joint pain Hematologic: No bleeding Psychiatric: No history of depression or anxiety   Physical  Exam:  Vital signs: BP 132/78   Pulse 88   Ht '5\' 3"'$  (1.6 m)   Wt 243 lb (110.2 kg)   LMP 05/23/2018   BMI 43.05 kg/m    Constitutional:   Pleasant obese Caucasian female appears to be in NAD, Well developed, Well nourished, alert and cooperative Head:  Normocephalic and atraumatic. Eyes:   PEERL, EOMI. No icterus. Conjunctiva pink. Ears:  Normal auditory acuity. Neck:  Supple Throat: Oral  cavity and pharynx without inflammation, swelling or lesion.  Respiratory: Respirations even and unlabored. Lungs clear to auscultation bilaterally.   No wheezes, crackles, or rhonchi.  Cardiovascular: Normal S1, S2. No MRG. Regular rate and rhythm. No peripheral edema, cyanosis or pallor.  Gastrointestinal:  Soft, nondistended, mild-mod epigastric ttp No rebound or guarding. Normal bowel sounds. No appreciable masses or hepatomegaly. Rectal:  Not performed.  Msk:  Symmetrical without gross deformities. Without edema, no deformity or joint abnormality.  Neurologic:  Alert and  oriented x4;  grossly normal neurologically.  Skin:   Dry and intact without significant lesions or rashes. Psychiatric: Demonstrates good judgement and reason without abnormal affect or behaviors.  No recent labs or imaging.  Assessment: 1.  Dysphagia: Trouble swallowing over the past year, history of reflux and epigastric pain, severe esophagitis at time of last EGD in 2019; likely GERD + stricture + gastritis 2.  GERD 3.  Epigastric pain  Plan: 1.  Scheduled patient for diagnostic EGD with dilation in the [place] with Dr. Hilarie Fredrickson.  Did provide patient with a detailed list of risks for the procedure and she agrees to proceed. Patient is appropriate for endoscopic procedure(s) in the ambulatory ([place]) setting.  2.  Reviewed anti-dysphagia measures including taking small bites, drinking sips of water in between bites, the chin tuck technique and avoiding distraction while eating 3.  Restarted the patient on Omeprazole 40 mg twice  daily, 30-60 minutes before breakfast and dinner #60 with 11 refills. 4.  Also restarted Pepcid 40 mg nightly #30 with 11 refills 5.  Patient to follow in clinic per recommendations from Dr. Hilarie Fredrickson after time of procedures.  Note and procedure scheduled/sent to Dr. Hilarie Fredrickson in lieu of Dr. Ardis Hughs absence.  Faith Newer, PA-C [facility] 07/20/2022, 10:58 AM  Cc: Ricard Dillon, NP

## 2022-07-25 ENCOUNTER — Telehealth: Payer: Self-pay | Admitting: Gastroenterology

## 2022-07-25 ENCOUNTER — Encounter: Payer: Medicaid Other | Admitting: Gastroenterology

## 2022-07-25 NOTE — Telephone Encounter (Signed)
Good Afternoon Dr. Candis Schatz,   Patient called stating that she needed to cancel her EGD procedure with you this afternoon at 4:00 due to her husband being in the hospital.   Patient was rescheduled for 4/19 at 4:00

## 2022-07-26 ENCOUNTER — Other Ambulatory Visit: Payer: Self-pay | Admitting: Physician Assistant

## 2022-08-06 NOTE — Progress Notes (Signed)
Addendum: Reviewed and agree with assessment and management plan. Dantae Meunier M, MD  

## 2022-08-15 ENCOUNTER — Other Ambulatory Visit: Payer: Self-pay | Admitting: Physician Assistant

## 2022-08-15 NOTE — Telephone Encounter (Signed)
Last refill by Historical provider

## 2022-08-23 ENCOUNTER — Encounter: Payer: Self-pay | Admitting: Physician Assistant

## 2022-08-24 NOTE — Telephone Encounter (Signed)
Please see pt message and advise, she is requesting refills of Gabapentin and Hydroxizine and previously filled by former provider. Please sent to [facility] in [place] for patient

## 2022-08-25 ENCOUNTER — Telehealth: Payer: Self-pay

## 2022-08-25 NOTE — Telephone Encounter (Signed)
Uploaded for PA [id number] eff 04/29-07/28/2024 CPT code 09811.  Notified patient via MyChart

## 2022-08-26 ENCOUNTER — Encounter: Payer: Medicaid Other | Admitting: Gastroenterology

## 2022-08-29 ENCOUNTER — Ambulatory Visit: Payer: Medicaid Other | Admitting: Gastroenterology

## 2022-08-30 ENCOUNTER — Other Ambulatory Visit (HOSPITAL_COMMUNITY): Payer: Self-pay

## 2022-08-30 ENCOUNTER — Telehealth: Payer: Self-pay

## 2022-08-30 NOTE — Telephone Encounter (Addendum)
Received denial from insurance company for CPT [phone] due to, it is not a covered benefit under the patients plan. Sent message via MyChart advising and offering a quote.  04/24-Patient turned down quote offer.

## 2022-09-08 ENCOUNTER — Other Ambulatory Visit (HOSPITAL_COMMUNITY): Payer: Self-pay

## 2022-09-13 NOTE — Progress Notes (Deleted)
09/13/22 ALL: She returns for second Botox procedure. Previously having daily migraines. Rizatriptan   06/16/2022 JM: Patient is being seen for initial Botox visit.  She continues to have daily migraine type headaches. Does report benefit with higher dose of Ubrelvy, at times may need to take second dose.  Tolerated procedure well today. Will return in 3 months for repeat injection.   Consent Form Botulism Toxin Injection For Chronic Migraine    Reviewed orally with patient, additionally signature is on file:  Botulism toxin has been approved by the Federal drug administration for treatment of chronic migraine. Botulism toxin does not cure chronic migraine and it may not be effective in some patients.  The administration of botulism toxin is accomplished by injecting a small amount of toxin into the muscles of the neck and head. Dosage must be titrated for each individual. Any benefits resulting from botulism toxin tend to wear off after 3 months with a repeat injection required if benefit is to be maintained. Injections are usually done every 3-4 months with maximum effect peak achieved by about 2 or 3 weeks. Botulism toxin is expensive and you should be sure of what costs you will incur resulting from the injection.  The side effects of botulism toxin use for chronic migraine may include:   -Transient, and usually mild, facial weakness with facial injections  -Transient, and usually mild, head or neck weakness with head/neck injections  -Reduction or loss of forehead facial animation due to forehead muscle weakness  -Eyelid drooping  -Dry eye  -Pain at the site of injection or bruising at the site of injection  -Double vision  -Potential unknown long term risks   Contraindications: You should not have Botox if you are pregnant, nursing, allergic to albumin, have an infection, skin condition, or muscle weakness at the site of the injection, or have myasthenia gravis, Lambert-Eaton  syndrome, or ALS.  It is also possible that as with any injection, there may be an allergic reaction or no effect from the medication. Reduced effectiveness after repeated injections is sometimes seen and rarely infection at the injection site may occur. All care will be taken to prevent these side effects. If therapy is given over a long time, atrophy and wasting in the muscle injected may occur. Occasionally the patient's become refractory to treatment because they develop antibodies to the toxin. In this event, therapy needs to be modified.  I have read the above information and consent to the administration of botulism toxin.    BOTOX PROCEDURE NOTE FOR MIGRAINE HEADACHE  Contraindications and precautions discussed with patient(above). Aseptic procedure was observed and patient tolerated procedure. Procedure performed by Shawnie Dapper, FNP-C.   The condition has existed for more than 6 months, and pt does not have a diagnosis of ALS, Myasthenia Gravis or Lambert-Eaton Syndrome.  Risks and benefits of injections discussed and pt agrees to proceed with the procedure.  Written consent obtained  These injections are medically necessary. Pt  receives good benefits from these injections. These injections do not cause sedations or hallucinations which the oral therapies may cause.   Description of procedure:  The patient was placed in a sitting position. The standard protocol was used for Botox as follows, with 5 units of Botox injected at each site:  -Procerus muscle, midline injection  -Corrugator muscle, bilateral injection  -Frontalis muscle, bilateral injection, with 2 sites each side, medial injection was performed in the upper one third of the frontalis muscle, in the region  vertical from the medial inferior edge of the superior orbital rim. The lateral injection was again in the upper one third of the forehead vertically above the lateral limbus of the cornea, 1.5 cm lateral to the medial  injection site.  -Temporalis muscle injection, 4 sites, bilaterally. The first injection was 3 cm above the tragus of the ear, second injection site was 1.5 cm to 3 cm up from the first injection site in line with the tragus of the ear. The third injection site was 1.5-3 cm forward between the first 2 injection sites. The fourth injection site was 1.5 cm posterior to the second injection site. 5th site laterally in the temporalis  muscleat the level of the outer canthus.  -Occipitalis muscle injection, 3 sites, bilaterally. The first injection was done one half way between the occipital protuberance and the tip of the mastoid process behind the ear. The second injection site was done lateral and superior to the first, 1 fingerbreadth from the first injection. The third injection site was 1 fingerbreadth superiorly and medially from the first injection site.  -Cervical paraspinal muscle injection, 2 sites, bilaterally. The first injection site was 1 cm from the midline of the cervical spine, 3 cm inferior to the lower border of the occipital protuberance. The second injection site was 1.5 cm superiorly and laterally to the first injection site.  -Trapezius muscle injection was performed at 3 sites, bilaterally. The first injection site was in the upper trapezius muscle halfway between the inflection point of the neck, and the acromion. The second injection site was one half way between the acromion and the first injection site. The third injection was done between the first injection site and the inflection point of the neck.   Will return for repeat injection in 3 months.   A total of 200 units of Botox was prepared, 155 units of Botox was injected as documented above, any Botox not injected was wasted. The patient tolerated the procedure well, there were no complications of the above procedure.

## 2022-09-14 ENCOUNTER — Ambulatory Visit: Payer: Medicaid Other | Admitting: Family Medicine

## 2022-09-14 ENCOUNTER — Encounter: Payer: Self-pay | Admitting: Family Medicine

## 2022-09-14 DIAGNOSIS — G43019 Migraine without aura, intractable, without status migrainosus: Secondary | ICD-10-CM

## 2022-09-15 ENCOUNTER — Encounter: Payer: Medicaid Other | Admitting: Physician Assistant

## 2022-09-19 ENCOUNTER — Ambulatory Visit (AMBULATORY_SURGERY_CENTER): Payer: Medicaid Other

## 2022-09-19 VITALS — Ht 63.0 in | Wt 243.0 lb

## 2022-09-19 DIAGNOSIS — G8929 Other chronic pain: Secondary | ICD-10-CM

## 2022-09-19 DIAGNOSIS — K219 Gastro-esophageal reflux disease without esophagitis: Secondary | ICD-10-CM

## 2022-09-19 DIAGNOSIS — R1314 Dysphagia, pharyngoesophageal phase: Secondary | ICD-10-CM

## 2022-09-19 DIAGNOSIS — R1013 Epigastric pain: Secondary | ICD-10-CM

## 2022-09-19 NOTE — Progress Notes (Signed)
No egg or soy allergy known to patient  No issues known to pt with past sedation with any surgeries or procedures Patient denies ever being told they had issues or difficulty with intubation  No FH of Malignant Hyperthermia Pt is not on diet pills Pt is not on  home 02  Pt is not on blood thinners  Pt denies issues with constipation  No A fib or A flutter Have any cardiac testing pending--no  Pt denies mobility issues   Patient's chart reviewed by Cathlyn Parsons CNRA prior to previsit and patient appropriate for the [facility].  Previsit completed and red dot placed by patient's name on their procedure day (on provider's schedule).     PV completed with pt. Instructions reviewed and sent to pt via mychart and mailing address

## 2022-09-21 ENCOUNTER — Encounter: Payer: Self-pay | Admitting: Physician Assistant

## 2022-09-21 NOTE — Telephone Encounter (Signed)
Called pt and lvm with PCP recommendations

## 2022-09-27 ENCOUNTER — Encounter: Payer: Medicaid Other | Admitting: Physician Assistant

## 2022-10-06 ENCOUNTER — Encounter: Payer: Self-pay | Admitting: Gastroenterology

## 2022-10-06 ENCOUNTER — Ambulatory Visit (AMBULATORY_SURGERY_CENTER): Payer: Medicaid Other | Admitting: Gastroenterology

## 2022-10-06 VITALS — BP 112/63 | HR 81 | Temp 98.7°F | Resp 78 | Ht 63.0 in | Wt 241.2 lb

## 2022-10-06 DIAGNOSIS — K2100 Gastro-esophageal reflux disease with esophagitis, without bleeding: Secondary | ICD-10-CM

## 2022-10-06 DIAGNOSIS — R1314 Dysphagia, pharyngoesophageal phase: Secondary | ICD-10-CM

## 2022-10-06 DIAGNOSIS — K2270 Barrett's esophagus without dysplasia: Secondary | ICD-10-CM | POA: Diagnosis not present

## 2022-10-06 DIAGNOSIS — K219 Gastro-esophageal reflux disease without esophagitis: Secondary | ICD-10-CM

## 2022-10-06 MED ORDER — SODIUM CHLORIDE 0.9 % IV SOLN: 500.0000 mL | Freq: Once | INTRAVENOUS | Status: DC

## 2022-10-06 MED ORDER — OMEPRAZOLE 40 MG PO CPDR: 40.0000 mg | DELAYED_RELEASE_CAPSULE | Freq: Two times a day (BID) | ORAL | 3 refills | Status: DC

## 2022-10-06 NOTE — Progress Notes (Signed)
Pt's states no medical or surgical changes since previsit or office visit. 

## 2022-10-06 NOTE — Progress Notes (Signed)
Called to room to assist during endoscopic procedure.  Patient ID and intended procedure confirmed with present staff. Received instructions for my participation in the procedure from the performing physician.  

## 2022-10-06 NOTE — Progress Notes (Signed)
[facility] History and Physical   Primary Care Physician:  Allwardt, Crist Infante, PA-C   Reason for Procedure:   GERD, dysphagia, history of esophagitis  Plan:    EGD with possible dilation     HPI: Faith Beck is a 32 y.o. female undergoing EGD due to persistent GERD symptoms and dysphagia.  She has been taking omeprazole OTC daily plus famotidine but continues to have daily heartburn/nausea/vomiting.  She has occasional dysphagia.  She had an EGD in 2019 with LA Grade D esophagitis.    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 Authorizing Provider  acetaminophen (TYLENOL) 500 MG tablet Take 1,000 mg by mouth every 6 (six) hours as needed. 03/31/20  Yes [provider]  buPROPion (WELLBUTRIN SR) 150 MG 12 hr tablet Take 1 tablet (150 mg total) by mouth 2 (two) times daily. 06/16/22 10/06/22 Yes Allwardt, Alyssa M, PA-C  cetirizine (ZYRTEC) 10 MG tablet Take 10 mg by mouth daily as needed for allergies.   Yes [provider]  famotidine (PEPCID) 40 MG tablet Take 1 tablet (40 mg total) by mouth at bedtime. 07/20/22  Yes Unk Lightning, PA  omeprazole (PRILOSEC) 40 MG capsule Take 1 capsule (40 mg total) by mouth 2 (two) times daily. 07/20/22  Yes Unk Lightning, PA  promethazine  (PHENERGAN) 25 MG tablet Take 12.5-25 mg by mouth every 8 (eight) hours as needed. 09/21/22  Yes [provider]  rizatriptan (MAXALT) 10 MG tablet Take 1 tablet (10 mg total) by mouth as needed for migraine. May repeat once in 2 hours if needed. No more than 2 tablets in 24 hours. 06/23/22  Yes McCue, Shanda Bumps, NP  tamsulosin (FLOMAX) 0.4 MG CAPS capsule Take 0.4 mg by mouth daily. 09/26/22  Yes [provider]  VISTARIL 50 MG capsule Take 50 mg by mouth 2 (two) times daily as needed. 08/18/21  Yes [provider]  Albuterol Sulfate, sensor, (PROAIR DIGIHALER) 108 (90 Base) MCG/ACT AEPB Inhale 2 puffs into the lungs every 6 (six) hours as needed (Wheezing). 03/31/20   [provider]  botulinum toxin Type A (BOTOX) 200 units injection Provide to inject 155 units into the muscles of the head and neck every 12 weeks. Discard remainder. 05/19/22   Ocie Doyne, MD  Cholecalciferol (VITAMIN D3) 1.25 MG (50000 UT) CAPS Take by mouth. Patient not taking: Reported on 09/19/2022 07/02/21   [provider]  gabapentin (NEURONTIN) 300 MG capsule Take 300 mg by mouth 3 (three) times daily. Patient not taking: Reported on 09/19/2022 02/08/21   [provider]  valACYclovir (VALTREX) 500 MG tablet Take 500 mg by mouth every 12 (twelve) hours as needed. 06/03/22   [provider]  VENTOLIN HFA 108 (90 Base) MCG/ACT inhaler INHALE 2 PUFFS INTO THE LUNGS EVERY 6 HOURS AS NEEDED FOR WHEEZING OR SHORTNESS OF BREATH 01/17/20   Lezlie Lye, Meda Coffee, MD    Current Outpatient Medications  Medication Sig Dispense Refill   acetaminophen (TYLENOL) 500 MG tablet Take 1,000 mg by mouth every 6 (six) hours as needed.     buPROPion (WELLBUTRIN SR) 150 MG 12 hr tablet Take 1 tablet (150 mg total) by mouth 2 (two) times daily. 180 tablet 1   cetirizine (ZYRTEC) 10 MG tablet Take 10 mg by mouth daily as needed for allergies.     famotidine (PEPCID) 40 MG tablet Take 1  tablet (40 mg total) by mouth at bedtime. 30 tablet 11   omeprazole (PRILOSEC) 40 MG capsule Take 1 capsule (40 mg total) by mouth 2 (two) times daily. 60 capsule 11   promethazine (PHENERGAN) 25 MG tablet Take 12.5-25 mg by mouth every 8 (eight) hours as needed.     rizatriptan (MAXALT) 10 MG tablet Take 1 tablet (10 mg total) by mouth as needed for migraine. May repeat once in 2 hours if needed. No more than 2 tablets in 24 hours. 10 tablet 11   tamsulosin (FLOMAX) 0.4 MG CAPS capsule Take 0.4 mg by mouth daily.     VISTARIL 50 MG capsule Take 50 mg by mouth 2 (two) times daily as needed.     Albuterol Sulfate, sensor, (PROAIR DIGIHALER) 108 (90 Base) MCG/ACT AEPB Inhale 2 puffs into the lungs every 6 (six) hours as needed (Wheezing).     botulinum toxin Type A (BOTOX) 200 units injection Provide to inject 155 units into the muscles of the head and neck every 12 weeks. Discard remainder. 1 each 1   Cholecalciferol (VITAMIN D3) 1.25 MG (50000 UT) CAPS Take by mouth. (Patient not taking: Reported on 09/19/2022)     gabapentin (NEURONTIN) 300 MG capsule Take 300 mg by mouth 3 (three) times daily. (Patient not taking: Reported on 09/19/2022)     valACYclovir (VALTREX) 500 MG tablet Take 500 mg by mouth every 12 (twelve) hours as needed.     VENTOLIN HFA 108 (90 Base) MCG/ACT inhaler INHALE 2 PUFFS INTO THE LUNGS EVERY 6 HOURS AS NEEDED FOR WHEEZING OR SHORTNESS OF BREATH 18 g 2   Current Facility-Administered Medications  Medication Dose Route Frequency Provider Last Rate Last Admin   0.9 %  sodium chloride infusion  500 mL Intravenous Once Jenel Lucks, MD        Allergies as of 10/06/2022   (No Known Allergies)    Family History  Problem Relation Age of Onset   Asthma Mother    COPD Mother    Fibromyalgia Mother    Hypertension Father    Cancer Father        leukemia   Diabetes Father    Heart disease Father    Pancreatic cancer Father    Migraines Brother    Asthma Daughter     Healthy Son    Colon cancer Neg Hx    Esophageal cancer Neg Hx    Rectal cancer Neg Hx    Stomach cancer Neg Hx     Social History   Socioeconomic History   Marital status: Married    Spouse name: Not on file   Number of children: Not on file   Years of education: Not on file   Highest education level: Not on file  Occupational History  Not on file  Tobacco Use   Smoking status: Former    Types: Cigarettes    Quit date: 06/11/2015    Years since quitting: 7.3   Smokeless tobacco: Never  Vaping Use   Vaping Use: Never used  Substance and Sexual Activity   Alcohol use: Yes    Comment: rare once a month   Drug use: Yes    Types: Marijuana    Comment: occ   Sexual activity: Yes    Birth control/protection: None  Other Topics Concern   Not on file  Social History Narrative   Not on file   Social Determinants of Health   Financial Resource Strain: Not on file  Food Insecurity: Not on file  Transportation Needs: Not on file  Physical Activity: Not on file  Stress: Not on file  Social Connections: Not on file  Intimate Partner Violence: Not on file    Review of Systems:  All other review of systems negative except as mentioned in the HPI.  Physical Exam: Vital signs BP 112/72   Pulse 99   Temp 98.7 F (37.1 C)   Ht 5\' 3"  (1.6 m)   Wt 241 lb 3.2 oz (109.4 kg)   LMP 05/23/2018   SpO2 100%   BMI 42.73 kg/m   General:   Alert,  Well-developed, well-nourished, pleasant and cooperative in NAD Airway:  Mallampati 2 Lungs:  Clear throughout to auscultation.   Heart:  Regular rate and rhythm; no murmurs, clicks, rubs,  or gallops. Abdomen:  Soft, nontender and nondistended. Normal bowel sounds.   Neuro/Psych:  Normal mood and affect. A and O x 3   Oswin Johal E. Tomasa Rand, MD [facility]

## 2022-10-06 NOTE — Op Note (Signed)
[facility] Patient Name: Faith Beck Procedure Date: 10/06/2022 9:05 AM MRN: [id number] Endoscopist: Lorin Picket E. Tomasa Rand , MD, [id number] Age: 32 Referring MD:  Date of Birth: Mar 19, 1991 Gender: Female Account #: [id number] Procedure:                Upper GI endoscopy Indications:              Dysphagia, Esophageal reflux symptoms that persist                            despite appropriate therapy Medicines:                Monitored Anesthesia Care Procedure:                Pre-Anesthesia Assessment:                           - Prior to the procedure, a History and Physical                            was performed, and patient medications and                            allergies were reviewed. The patient's tolerance of                            previous anesthesia was also reviewed. The risks                            and benefits of the procedure and the sedation                            options and risks were discussed with the patient.                            All questions were answered, and informed consent                            was obtained. Prior Anticoagulants: The patient has                            taken no anticoagulant or antiplatelet agents. ASA                            Grade Assessment: II - A patient with mild systemic                            disease. After reviewing the risks and benefits,                            the patient was deemed in satisfactory condition to                            undergo the procedure.  After obtaining informed consent, the endoscope was                            passed under direct vision. Throughout the                            procedure, the patient's blood pressure, pulse, and                            oxygen saturations were monitored continuously. The                            Olympus Scope [id number] was introduced through the                            mouth, and  advanced to the third part of duodenum.                            The upper GI endoscopy was accomplished without                            difficulty. The patient tolerated the procedure                            well. Scope In: Scope Out: Findings:                 The examined portions of the nasopharynx,                            oropharynx and larynx were normal.                           There were esophageal mucosal changes suspicious                            for short-segment Barrett's esophagus present in                            the lower third of the esophagus. The maximum                            longitudinal extent of these mucosal changes was 2                            cm in length. Mucosa was biopsied with a cold                            forceps for histology in 4 quadrants at intervals                            of 1 cm from 33 to 34 cm from the incisors. A total  of 2 specimen bottles were sent to pathology.                            Estimated blood loss was minimal.                           LA Grade B (one or more mucosal breaks greater than                            5 mm, not extending between the tops of two mucosal                            folds) esophagitis with no bleeding was found.                           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                           The exam of the esophagus was otherwise normal.                           A 4 cm hiatal hernia was present.                           The entire examined stomach was normal.                           The examined duodenum was normal. Complications:            No immediate complications. Estimated Blood Loss:     Estimated blood loss was minimal. Impression:               - The examined portions of the nasopharynx,                            oropharynx and larynx were normal.                            - Esophageal mucosal changes suspicious for                            short-segment Barrett's esophagus. Biopsied.                           - LA Grade B reflux esophagitis with no bleeding.                           - Ectopic gastric mucosa in the upper third of the                            esophagus.                           -  4 cm hiatal hernia.                           - Normal stomach.                           - Normal examined duodenum.                           - No esophageal stricture/stenosis noted to explain                            dysphagia. Suspect dysphagia is related to                            GERD/esophagitis. Recommendation:           - Patient has a contact number available for                            emergencies. The signs and symptoms of potential                            delayed complications were discussed with the                            patient. Return to normal activities tomorrow.                            Written discharge instructions were provided to the                            patient.                           - Resume previous diet.                           - Continue present medications.                           - Await pathology results.                           - Use Prilosec (omeprazole) 40 mg PO BID for 8                            weeks, then once daily.                           - Repeat EGD in 8 weeks to assess healing of                            esophagitis Eviana Sibilia E. Tomasa Rand, MD 10/06/2022 9:35:01 AM This report has been signed electronically.

## 2022-10-06 NOTE — Progress Notes (Signed)
A and O x3. Report to RN. Tolerated MAC anesthesia well.Teeth unchanged after procedure. 

## 2022-10-06 NOTE — Patient Instructions (Signed)
YOU HAD AN ENDOSCOPIC PROCEDURE TODAY AT THE [facility]:   Refer to the procedure report that was given to you for any specific questions about what was found during the examination.  If the procedure report does not answer your questions, please call your gastroenterologist to clarify.  If you requested that your care partner not be given the details of your procedure findings, then the procedure report has been included in a sealed envelope for you to review at your convenience later.  YOU SHOULD EXPECT: Some feelings of bloating in the abdomen. Passage of more gas than usual.  Walking can help get rid of the air that was put into your GI tract during the procedure and reduce the bloating. If you had a lower endoscopy (such as a colonoscopy or flexible sigmoidoscopy) you may notice spotting of blood in your stool or on the toilet paper. If you underwent a bowel prep for your procedure, you may not have a normal bowel movement for a few days.  Please Note:  You might notice some irritation and congestion in your nose or some drainage.  This is from the oxygen used during your procedure.  There is no need for concern and it should clear up in a day or so.  SYMPTOMS TO REPORT IMMEDIATELY:  Following lower endoscopy (colonoscopy or flexible sigmoidoscopy):  Excessive amounts of blood in the stool  Significant tenderness or worsening of abdominal pains  Swelling of the abdomen that is new, acute  Fever of 100F or higher  Following upper endoscopy (EGD)  Vomiting of blood or coffee ground material  New chest pain or pain under the shoulder blades  Painful or persistently difficult swallowing  New shortness of breath  Fever of 100F or higher  Black, tarry-looking stools  For urgent or emergent issues, a gastroenterologist can be reached at any hour by calling [phone]. Do not use MyChart messaging for urgent concerns.    DIET:  We do recommend a small meal at first, but  then you may proceed to your regular diet.  Drink plenty of fluids but you should avoid alcoholic beverages for 24 hours.  ACTIVITY:  You should plan to take it easy for the rest of today and you should NOT DRIVE or use heavy machinery until tomorrow (because of the sedation medicines used during the test).    FOLLOW UP: Our staff will call the number listed on your records the next business day following your procedure.  We will call around 7:15- 8:00 am to check on you and address any questions or concerns that you may have regarding the information given to you following your procedure. If we do not reach you, we will leave a message.     If any biopsies were taken you will be contacted by phone or by letter within the next 1-3 weeks.  Please call us at [phone] if you have not heard about the biopsies in 3 weeks.    SIGNATURES/CONFIDENTIALITY: You and/or your care partner have signed paperwork which will be entered into your electronic medical record.  These signatures attest to the fact that that the information above on your After Visit Summary has been reviewed and is understood.  Full responsibility of the confidentiality of this discharge information lies with you and/or your care-partner. 

## 2022-10-07 ENCOUNTER — Telehealth: Payer: Self-pay

## 2022-10-07 NOTE — Telephone Encounter (Signed)
No answer on follow up call. VM full  

## 2022-10-10 ENCOUNTER — Encounter: Payer: Medicaid Other | Admitting: Physician Assistant

## 2022-10-11 ENCOUNTER — Other Ambulatory Visit: Payer: Self-pay | Admitting: Physician Assistant

## 2022-10-12 NOTE — Progress Notes (Signed)
Faith Beck,   The biopsies that I took during your recent procedure showed Barrett's mucosa (intestinal metaplasia), but NO sign of the pre-cancerous change called "dysplasia."    Barrett's esophagus is a change in the lining of the esophagus associated with long term acid reflux and is associated with an increased risk of esophageal cancer.  The risk of esophageal cancer is still very low, but it is recommended that patient's with Barrett's esophagus undergo 'surveillance' endoscopies every 3-5 years. It is also recommended that patient's with Barrett's esophagus take a proton pump inhibitor (medication to reduce stomach acid) daily.  Please take the omeprazole twice daily for 8 weeks to heal the esophagitis and repeat upper endoscopy in 8 weeks to assess healing of the esophagitis.  If the esophagitis has healed, you would then resume once daily omeprazole.

## 2022-10-19 ENCOUNTER — Ambulatory Visit (INDEPENDENT_AMBULATORY_CARE_PROVIDER_SITE_OTHER): Payer: Medicaid Other | Admitting: Physician Assistant

## 2022-10-19 ENCOUNTER — Encounter: Payer: Self-pay | Admitting: Physician Assistant

## 2022-10-19 VITALS — BP 124/88 | HR 100 | Temp 97.7°F | Ht 63.0 in | Wt 240.4 lb

## 2022-10-19 DIAGNOSIS — Z131 Encounter for screening for diabetes mellitus: Secondary | ICD-10-CM

## 2022-10-19 DIAGNOSIS — Z13220 Encounter for screening for lipoid disorders: Secondary | ICD-10-CM | POA: Diagnosis not present

## 2022-10-19 DIAGNOSIS — F32A Depression, unspecified: Secondary | ICD-10-CM

## 2022-10-19 DIAGNOSIS — E559 Vitamin D deficiency, unspecified: Secondary | ICD-10-CM

## 2022-10-19 DIAGNOSIS — F419 Anxiety disorder, unspecified: Secondary | ICD-10-CM

## 2022-10-19 DIAGNOSIS — N200 Calculus of kidney: Secondary | ICD-10-CM

## 2022-10-19 DIAGNOSIS — Z0000 Encounter for general adult medical examination without abnormal findings: Secondary | ICD-10-CM

## 2022-10-19 HISTORY — DX: Encounter for general adult medical examination without abnormal findings: Z00.00

## 2022-10-19 LAB — TSH: TSH: 5.43 u[IU]/mL (ref 0.35–5.50)

## 2022-10-19 LAB — COMPREHENSIVE METABOLIC PANEL
ALT: 9 U/L (ref 0–35)
AST: 13 U/L (ref 0–37)
Albumin: 4.1 g/dL (ref 3.5–5.2)
Alkaline Phosphatase: 79 U/L (ref 39–117)
BUN: 13 mg/dL (ref 6–23)
CO2: 26 mEq/L (ref 19–32)
Calcium: 9.4 mg/dL (ref 8.4–10.5)
Chloride: 103 mEq/L (ref 96–112)
Creatinine, Ser: 0.86 mg/dL (ref 0.40–1.20)
GFR: 89.07 mL/min (ref >60.00)
Glucose, Bld: 101 mg/dL — ABNORMAL HIGH (ref 70–99)
Potassium: 4 mEq/L (ref 3.5–5.1)
Sodium: 137 mEq/L (ref 135–145)
Total Bilirubin: 0.6 mg/dL (ref 0.2–1.2)
Total Protein: 6.9 g/dL (ref 6.0–8.3)

## 2022-10-19 LAB — CBC WITH DIFFERENTIAL/PLATELET
Basophils Absolute: 0.1 10*3/uL (ref 0.0–0.1)
Basophils Relative: 1 % (ref 0.0–3.0)
Eosinophils Absolute: 0.4 10*3/uL (ref 0.0–0.7)
Eosinophils Relative: 5.7 % — ABNORMAL HIGH (ref 0.0–5.0)
HCT: 41.3 % (ref 36.0–46.0)
Hemoglobin: 13.5 g/dL (ref 12.0–15.0)
Lymphocytes Relative: 22.9 % (ref 12.0–46.0)
Lymphs Abs: 1.4 10*3/uL (ref 0.7–4.0)
MCHC: 32.7 g/dL (ref 30.0–36.0)
MCV: 88.3 fl (ref 78.0–100.0)
Monocytes Absolute: 0.5 10*3/uL (ref 0.1–1.0)
Monocytes Relative: 8.2 % (ref 3.0–12.0)
Neutro Abs: 3.9 10*3/uL (ref 1.4–7.7)
Neutrophils Relative %: 62.2 % (ref 43.0–77.0)
Platelets: 246 10*3/uL (ref 150.0–400.0)
RBC: 4.67 Mil/uL (ref 3.87–5.11)
RDW: 14.8 % (ref 11.5–15.5)
WBC: 6.3 10*3/uL (ref 4.0–10.5)

## 2022-10-19 LAB — LIPID PANEL
Cholesterol: 144 mg/dL (ref 0–200)
HDL: 51.3 mg/dL (ref >39.00)
LDL Cholesterol: 73 mg/dL (ref 0–99)
NonHDL: 93
Total CHOL/HDL Ratio: 3
Triglycerides: 98 mg/dL (ref 0.0–149.0)
VLDL: 19.6 mg/dL (ref 0.0–40.0)

## 2022-10-19 LAB — HEMOGLOBIN A1C: Hgb A1c MFr Bld: 5.1 % (ref 4.6–6.5)

## 2022-10-19 LAB — VITAMIN D 25 HYDROXY (VIT D DEFICIENCY, FRACTURES): VITD: 29.05 ng/mL — ABNORMAL LOW (ref 30.00–100.00)

## 2022-10-19 MED ORDER — GABAPENTIN 300 MG PO CAPS: 300.0000 mg | ORAL_CAPSULE | Freq: Three times a day (TID) | ORAL | 0 refills | Status: DC

## 2022-10-19 MED ORDER — VISTARIL 50 MG PO CAPS: 50.0000 mg | ORAL_CAPSULE | Freq: Two times a day (BID) | ORAL | 1 refills | Status: AC | PRN

## 2022-10-19 MED ORDER — BUPROPION HCL ER (XL) 300 MG PO TB24: 300.0000 mg | ORAL_TABLET | Freq: Every day | ORAL | 0 refills | Status: DC

## 2022-10-19 NOTE — Patient Instructions (Addendum)
Please get me a copy or send through MyChart your recent abdominal CT scan from [facility].  Labs today   F/up with your specialists

## 2022-10-19 NOTE — Assessment & Plan Note (Signed)
Worse recently from stress and medical issues for her and daughter. Refilled gabapentin 300 mg TID, which has helped previously. Vistaril 50 mg BID prn Wellbutrin XL 300 mg daily Buspar 10 mg BID  Consider psychiatry

## 2022-10-19 NOTE — Progress Notes (Signed)
Subjective:    Patient ID: Faith Beck, female    DOB: Apr 16, 1991, 32 y.o.   MRN: [id number]  Chief Complaint  Patient presents with   Annual Exam    Pt in office for annual CPE and fasting labs; pt c/o of being constantly anxious for past three weeks; pt was having kidney pain and kidney stones seen at ED; pt was unable to work and lost her job; pt not been able to take Gabapentin since April due to running out;     HPI Patient is in today for annual exam.  Currently seeing urology for bilateral renal stones  Also seeing GI for GERD   Acute concerns: Anxiety worse recently - her own health and 26 yo daughter going through medical too  Kidney stones   Health maintenance: Lifestyle/ exercise: Minimal Nutrition: Needs improvement Mental health: Anxiety has been bad the last few weeks without medication; Buspar for years, hospitalized in 2022, has been doing well with gabapentin and Vistaril  Sleep: Difficult due to recent stress  Substance use: Occasional cigarette, smokes marijuana  Sexual activity: Monogamous  Immunizations: UTD, youngest child is 46 yo  Colonoscopy: Will start at 44 Pap: UTD - follows with GYN  Menstrual: hysterectomy 2021   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)    Migraine without status migrainosus    Ulcer 2018    Past Surgical History:  Procedure Laterality Date   ABDOMINAL HYSTERECTOMY     still has L ovary   ABDOMINAL HYSTERECTOMY  2021   fibroids, endometriosis   CESAREAN SECTION     x3   CHOLECYSTECTOMY N/A 02/19/2015   Procedure: LAPAROSCOPIC CHOLECYSTECTOMY WITH INTRAOPERATIVE CHOLANGIOGRAM;  Surgeon: Darnell Level, MD;  Location: [facility];  Service: General;  Laterality: N/A;    DILATION AND CURETTAGE OF UTERUS     TUBAL LIGATION      Family History  Problem Relation Age of Onset   Asthma Mother    COPD Mother    Fibromyalgia Mother    Hypertension Mother    Hypertension Father    Cancer Father        leukemia   Diabetes Father    Heart disease Father    Pancreatic cancer Father    Alcohol abuse Father    Anxiety disorder Father    Drug abuse Father    Migraines Brother    Alcohol abuse Brother    Asthma Daughter    ADD / ADHD Daughter    Anxiety disorder Daughter    Learning disabilities Daughter    Healthy Son    ADD / ADHD Son    Intellectual disability Son    Learning disabilities Son    Anxiety disorder Maternal Grandmother    Heart disease Maternal Grandmother    Hypertension Maternal Grandmother    Anxiety disorder Maternal Aunt    Colon cancer Neg Hx    Esophageal cancer Neg Hx    Rectal cancer Neg Hx    Stomach cancer Neg Hx     Social History   Tobacco Use   Smoking status: Former    Types: Cigarettes    Quit date: 06/11/2015    Years  since quitting: 7.3   Smokeless tobacco: Never  Vaping Use   Vaping Use: Never used  Substance Use Topics   Alcohol use: Yes    Comment: rare once a month   Drug use: Yes    Types: Marijuana    Comment: occ     No Known Allergies  Review of Systems NEGATIVE UNLESS OTHERWISE INDICATED IN HPI      Objective:     BP 124/88 (BP Location: Left Arm)   Pulse 100   Temp 97.7 F (36.5 C) (Temporal)   Ht 5\' 3"  (1.6 m)   Wt 240 lb 6.4 oz (109 kg)   LMP 05/23/2018   SpO2 99%   Breastfeeding No   BMI 42.58 kg/m   Wt Readings from Last 3 Encounters:  10/19/22 240 lb 6.4 oz (109 kg)  10/06/22 241 lb 3.2 oz (109.4 kg)  09/19/22 243 lb (110.2 kg)    BP Readings from Last 3 Encounters:  10/19/22 124/88  10/06/22 112/63  07/20/22 132/78     Physical Exam Vitals and nursing note reviewed.  Constitutional:      Appearance: Normal appearance. She is obese. She is not  toxic-appearing.  HENT:     Head: Normocephalic and atraumatic.     Right Ear: Tympanic membrane, ear canal and external ear normal.     Left Ear: Tympanic membrane, ear canal and external ear normal.     Nose: Nose normal.     Mouth/Throat:     Mouth: Mucous membranes are moist.  Eyes:     Extraocular Movements: Extraocular movements intact.     Conjunctiva/sclera: Conjunctivae normal.     Pupils: Pupils are equal, round, and reactive to light.  Cardiovascular:     Rate and Rhythm: Normal rate and regular rhythm.     Pulses: Normal pulses.     Heart sounds: Normal heart sounds.  Pulmonary:     Effort: Pulmonary effort is normal.     Breath sounds: Normal breath sounds.  Abdominal:     General: Abdomen is flat. Bowel sounds are normal.     Palpations: Abdomen is soft.     Tenderness: There is abdominal tenderness (diffusely).  Musculoskeletal:        General: Normal range of motion.     Cervical back: Normal range of motion and neck supple.     Right lower leg: No edema.     Left lower leg: No edema.  Skin:    General: Skin is warm and dry.  Neurological:     General: No focal deficit present.     Mental Status: She is alert and oriented to person, place, and time.  Psychiatric:        Mood and Affect: Mood normal.        Behavior: Behavior normal.        Thought Content: Thought content normal.        Judgment: Judgment normal.        Assessment & Plan:  Encounter for annual physical exam -     CBC with Differential/Platelet -     Comprehensive metabolic panel -     Lipid panel -     TSH -     Hemoglobin A1c  Anxiety and depression Assessment & Plan: Worse recently from stress and medical issues for her and daughter. Refilled gabapentin 300 mg TID, which has helped previously. Vistaril 50 mg BID prn Wellbutrin XL 300 mg daily Buspar 10 mg BID  Consider  psychiatry   Orders: -     Vistaril; Take 1 capsule (50 mg total) by mouth 2 (two) times daily as  needed.  Dispense: 180 capsule; Refill: 1 -     Gabapentin; Take 1 capsule (300 mg total) by mouth 3 (three) times daily.  Dispense: 270 capsule; Refill: 0 -     buPROPion HCl ER (XL); Take 1 tablet (300 mg total) by mouth daily.  Dispense: 30 tablet; Refill: 0 -     PTH, Intact (ICMA) and Ionized Calcium  Renal stones -     PTH, Intact (ICMA) and Ionized Calcium  Vitamin D deficiency -     VITAMIN D 25 Hydroxy (Vit-D Deficiency, Fractures)    Age-appropriate screening and counseling performed today. Will check labs and call with results. Preventive measures discussed and printed in AVS for patient.   Patient Counseling: [x]   Nutrition: Stressed importance of moderation in sodium/caffeine intake, saturated fat and cholesterol, caloric balance, sufficient intake of fresh fruits, vegetables, and fiber.  [x]   Stressed the importance of regular exercise.   [x]   Substance Abuse: Discussed cessation/primary prevention of tobacco, alcohol, or other drug use; driving or other dangerous activities under the influence; availability of treatment for abuse.   []   Injury prevention: Discussed safety belts, safety helmets, smoke detector, smoking near bedding or upholstery.   []   Sexuality: Discussed sexually transmitted diseases, partner selection, use of condoms, avoidance of unintended pregnancy  and contraceptive alternatives.   [x]   Dental health: Discussed importance of regular tooth brushing, flossing, and dental visits.  [x]   Health maintenance and immunizations reviewed. Please refer to Health maintenance section.        Return in about 6 months (around 04/20/2023) for recheck/follow-up.     Elicia Lui M Jenalee Trevizo, PA-C

## 2022-10-20 ENCOUNTER — Other Ambulatory Visit: Payer: Self-pay | Admitting: Physician Assistant

## 2022-10-20 LAB — PTH, INTACT (ICMA) AND IONIZED CALCIUM
Calcium, Ion: 5.1 mg/dL (ref 4.7–5.5)
Calcium: 9.3 mg/dL (ref 8.6–10.2)
PTH: 55 pg/mL (ref 16–77)

## 2022-10-20 MED ORDER — HYDROXYZINE PAMOATE 50 MG PO CAPS: 50.0000 mg | ORAL_CAPSULE | Freq: Two times a day (BID) | ORAL | 0 refills | Status: DC | PRN

## 2022-10-20 NOTE — Telephone Encounter (Signed)
Please see msg and advise.  

## 2022-10-20 NOTE — Telephone Encounter (Signed)
Please see pt repsonse and advise if you would like me to send Rx and confirm dosage please

## 2022-10-21 ENCOUNTER — Encounter: Payer: Self-pay | Admitting: Physician Assistant

## 2022-10-21 ENCOUNTER — Other Ambulatory Visit: Payer: Self-pay | Admitting: Physician Assistant

## 2022-10-21 MED ORDER — DOXYCYCLINE HYCLATE 100 MG PO TABS: 100.0000 mg | ORAL_TABLET | Freq: Two times a day (BID) | ORAL | 0 refills | Status: DC

## 2022-11-13 ENCOUNTER — Other Ambulatory Visit: Payer: Self-pay | Admitting: Physician Assistant

## 2022-11-13 DIAGNOSIS — F32A Depression, unspecified: Secondary | ICD-10-CM

## 2022-11-14 ENCOUNTER — Other Ambulatory Visit: Payer: Self-pay

## 2022-11-25 ENCOUNTER — Other Ambulatory Visit: Payer: Self-pay

## 2022-11-25 ENCOUNTER — Other Ambulatory Visit: Payer: Self-pay | Admitting: Psychiatry

## 2022-11-25 ENCOUNTER — Encounter: Payer: Medicaid Other | Admitting: Gastroenterology

## 2022-11-25 ENCOUNTER — Telehealth: Payer: Self-pay | Admitting: Gastroenterology

## 2022-11-25 NOTE — Telephone Encounter (Signed)
Dear Dr. Tomasa Rand,  We called this patient at 9:30 today, however we could not leave a message because the mailbox was full.    I will NO SHOW patient  Medicaid

## 2022-11-28 ENCOUNTER — Other Ambulatory Visit: Payer: Self-pay

## 2022-11-28 ENCOUNTER — Other Ambulatory Visit (HOSPITAL_COMMUNITY): Payer: Self-pay

## 2022-11-28 MED ORDER — BOTOX 200 UNITS IJ SOLR
INTRAMUSCULAR | 1 refills | Status: DC
  Filled 2022-11-28: qty 1, 34d supply, fill #0

## 2022-11-28 NOTE — Telephone Encounter (Signed)
Last seen on 06/16/22 Follow up scheduled on 12/12/22 for botox

## 2022-12-01 ENCOUNTER — Other Ambulatory Visit (HOSPITAL_COMMUNITY): Payer: Self-pay

## 2022-12-01 ENCOUNTER — Other Ambulatory Visit: Payer: Self-pay

## 2022-12-03 LAB — LAB REPORT - SCANNED: EGFR: 90

## 2022-12-07 NOTE — Progress Notes (Deleted)
12/07/22 ALL: Faith Beck returns for Botox. First procedure with [name] 06/2022.   06/16/2022 JM: Patient is being seen for initial Botox visit.  She continues to have daily migraine type headaches. Does report benefit with higher dose of Ubrelvy, at times may need to take second dose.  Tolerated procedure well today. Will return in 3 months for repeat injection.   Consent Form Botulism Toxin Injection For Chronic Migraine    Reviewed orally with patient, additionally signature is on file:  Botulism toxin has been approved by the Federal drug administration for treatment of chronic migraine. Botulism toxin does not cure chronic migraine and it may not be effective in some patients.  The administration of botulism toxin is accomplished by injecting a small amount of toxin into the muscles of the neck and head. Dosage must be titrated for each individual. Any benefits resulting from botulism toxin tend to wear off after 3 months with a repeat injection required if benefit is to be maintained. Injections are usually done every 3-4 months with maximum effect peak achieved by about 2 or 3 weeks. Botulism toxin is expensive and you should be sure of what costs you will incur resulting from the injection.  The side effects of botulism toxin use for chronic migraine may include:   -Transient, and usually mild, facial weakness with facial injections  -Transient, and usually mild, head or neck weakness with head/neck injections  -Reduction or loss of forehead facial animation due to forehead muscle weakness  -Eyelid drooping  -Dry eye  -Pain at the site of injection or bruising at the site of injection  -Double vision  -Potential unknown long term risks   Contraindications: You should not have Botox if you are pregnant, nursing, allergic to albumin, have an infection, skin condition, or muscle weakness at the site of the injection, or have myasthenia gravis, Lambert-Eaton syndrome, or  ALS.  It is also possible that as with any injection, there may be an allergic reaction or no effect from the medication. Reduced effectiveness after repeated injections is sometimes seen and rarely infection at the injection site may occur. All care will be taken to prevent these side effects. If therapy is given over a long time, atrophy and wasting in the muscle injected may occur. Occasionally the patient's become refractory to treatment because they develop antibodies to the toxin. In this event, therapy needs to be modified.  I have read the above information and consent to the administration of botulism toxin.    BOTOX PROCEDURE NOTE FOR MIGRAINE HEADACHE  Contraindications and precautions discussed with patient(above). Aseptic procedure was observed and patient tolerated procedure. Procedure performed by Shawnie Dapper, FNP-C.   The condition has existed for more than 6 months, and pt does not have a diagnosis of ALS, Myasthenia Gravis or Lambert-Eaton Syndrome.  Risks and benefits of injections discussed and pt agrees to proceed with the procedure.  Written consent obtained  These injections are medically necessary. Pt  receives good benefits from these injections. These injections do not cause sedations or hallucinations which the oral therapies may cause.   Description of procedure:  The patient was placed in a sitting position. The standard protocol was used for Botox as follows, with 5 units of Botox injected at each site:  -Procerus muscle, midline injection  -Corrugator muscle, bilateral injection  -Frontalis muscle, bilateral injection, with 2 sites each side, medial injection was performed in the upper one third of the frontalis muscle, in the region vertical  from the medial inferior edge of the superior orbital rim. The lateral injection was again in the upper one third of the forehead vertically above the lateral limbus of the cornea, 1.5 cm lateral to the medial injection  site.  -Temporalis muscle injection, 4 sites, bilaterally. The first injection was 3 cm above the tragus of the ear, second injection site was 1.5 cm to 3 cm up from the first injection site in line with the tragus of the ear. The third injection site was 1.5-3 cm forward between the first 2 injection sites. The fourth injection site was 1.5 cm posterior to the second injection site. 5th site laterally in the temporalis  muscleat the level of the outer canthus.  -Occipitalis muscle injection, 3 sites, bilaterally. The first injection was done one half way between the occipital protuberance and the tip of the mastoid process behind the ear. The second injection site was done lateral and superior to the first, 1 fingerbreadth from the first injection. The third injection site was 1 fingerbreadth superiorly and medially from the first injection site.  -Cervical paraspinal muscle injection, 2 sites, bilaterally. The first injection site was 1 cm from the midline of the cervical spine, 3 cm inferior to the lower border of the occipital protuberance. The second injection site was 1.5 cm superiorly and laterally to the first injection site.  -Trapezius muscle injection was performed at 3 sites, bilaterally. The first injection site was in the upper trapezius muscle halfway between the inflection point of the neck, and the acromion. The second injection site was one half way between the acromion and the first injection site. The third injection was done between the first injection site and the inflection point of the neck.   Will return for repeat injection in 3 months.   A total of 200 units of Botox was prepared, 155 units of Botox was injected as documented above, any Botox not injected was wasted. The patient tolerated the procedure well, there were no complications of the above procedure.

## 2022-12-12 ENCOUNTER — Ambulatory Visit: Payer: Medicaid Other | Admitting: Family Medicine

## 2022-12-12 ENCOUNTER — Encounter: Payer: Self-pay | Admitting: Family Medicine

## 2022-12-12 DIAGNOSIS — G43019 Migraine without aura, intractable, without status migrainosus: Secondary | ICD-10-CM

## 2022-12-17 ENCOUNTER — Other Ambulatory Visit: Payer: Self-pay | Admitting: Physician Assistant

## 2022-12-17 DIAGNOSIS — F419 Anxiety disorder, unspecified: Secondary | ICD-10-CM

## 2023-01-01 DIAGNOSIS — T424X4A Poisoning by benzodiazepines, undetermined, initial encounter: Secondary | ICD-10-CM | POA: Insufficient documentation

## 2023-01-01 DIAGNOSIS — U071 COVID-19: Secondary | ICD-10-CM | POA: Insufficient documentation

## 2023-01-08 ENCOUNTER — Other Ambulatory Visit: Payer: Self-pay | Admitting: Physician Assistant

## 2023-01-08 DIAGNOSIS — F32A Depression, unspecified: Secondary | ICD-10-CM

## 2023-01-09 ENCOUNTER — Encounter: Payer: Self-pay | Admitting: Physician Assistant

## 2023-01-10 NOTE — Telephone Encounter (Signed)
Schedule VV or in office follow up to discuss?

## 2023-01-13 ENCOUNTER — Telehealth: Payer: Self-pay | Admitting: Physician Assistant

## 2023-01-13 ENCOUNTER — Encounter: Payer: Medicaid Other | Admitting: Physician Assistant

## 2023-01-13 NOTE — Telephone Encounter (Signed)
I have spoken with patient in regard to having 3 no show's .   I have informed patient of the cancellation/ no show policy.  Patient does understand she will be discharged if another no show occurs.

## 2023-01-13 NOTE — Progress Notes (Signed)
PATIENT DNKA

## 2023-01-18 ENCOUNTER — Telehealth (INDEPENDENT_AMBULATORY_CARE_PROVIDER_SITE_OTHER): Payer: Medicaid Other | Admitting: Physician Assistant

## 2023-01-18 ENCOUNTER — Encounter: Payer: Self-pay | Admitting: Physician Assistant

## 2023-01-18 VITALS — Temp 97.8°F | Wt 235.0 lb

## 2023-01-18 DIAGNOSIS — F32A Depression, unspecified: Secondary | ICD-10-CM

## 2023-01-18 DIAGNOSIS — F419 Anxiety disorder, unspecified: Secondary | ICD-10-CM | POA: Diagnosis not present

## 2023-01-18 DIAGNOSIS — F1410 Cocaine abuse, uncomplicated: Secondary | ICD-10-CM | POA: Diagnosis not present

## 2023-01-18 DIAGNOSIS — F314 Bipolar disorder, current episode depressed, severe, without psychotic features: Secondary | ICD-10-CM

## 2023-01-18 MED ORDER — LITHIUM CARBONATE 300 MG PO CAPS: 300.0000 mg | ORAL_CAPSULE | Freq: Two times a day (BID) | ORAL | 0 refills | Status: DC

## 2023-01-18 NOTE — Progress Notes (Signed)
   Virtual Visit via Video Note  I connected with  Faith Beck  on 01/23/23 at 10:15 AM EDT by a video enabled telemedicine application and verified that I am speaking with the correct person using two identifiers.  Location: Patient: home Provider: Nature conservation officer at [employer] Persons present: Patient and myself   I discussed the limitations of evaluation and management by telemedicine and the availability of in person appointments. The patient expressed understanding and agreed to proceed.   History of Present Illness:  32 yo female presenting for VV hospital f/up - involuntarily committed due to suicide intent. Discharged 01/05/23 - Husband informed her same day their marriage is over Staying at her brother's house now Her oldest daughter is staying with her mother Boys are back and forth between patient and their father at this time Feeling good today  While in the hospital, the Abilify was fine.  She has not been back on the abilify due to tremors & chest pressure with the medicine.  Currently on Wellbutrin XL 150 mg, Buspar 10 mg twice daily  Previously had been on Seroquel, but caused too much sleepiness during the day.   Pt says she was not provided psych referral upon discharge.   Denies any SI and no cocaine use since hospital discharge.    Observations/Objective:   Gen: Awake, alert, no acute distress Resp: Breathing is even and non-labored Psych: calm/pleasant demeanor Neuro: Alert and Oriented x 3, + facial symmetry, speech is clear.   Assessment and Plan:  Problem List Items Addressed This Visit       Other   Anxiety and depression   Relevant Medications   buPROPion (WELLBUTRIN XL) 150 MG 24 hr tablet   busPIRone (BUSPAR) 10 MG tablet   lithium carbonate 300 MG capsule   Other Relevant Orders   Ambulatory referral to Psychiatry   Bipolar 1 disorder, depressed, severe (HCC) - Primary    Plan to start on Lithium 300 mg BID Will  need to check trough level next week Pt aware of risks vs benefits and possible adverse reactions Will help manage until able to be seen with psychiatry  Close f/up with me in 2 weeks   [facility] info provided in case of any further SI (none today)      Relevant Medications   lithium carbonate 300 MG capsule   Other Relevant Orders   Ambulatory referral to Psychiatry   Drug abuse, cocaine type (HCC)    Per pt, addictive personality, will use if friends around with it, otherwise does not seek it out. No use since hospital discharge. Will have her f/up with psychiatry to help stay clean.       Relevant Orders   Ambulatory referral to Psychiatry     Follow Up Instructions:    I discussed the assessment and treatment plan with the patient. The patient was provided an opportunity to ask questions and all were answered. The patient agreed with the plan and demonstrated an understanding of the instructions.   The patient was advised to call back or seek an in-person evaluation if the symptoms worsen or if the condition fails to improve as anticipated.  Temitope Flammer M Tresia Revolorio, PA-C

## 2023-01-19 ENCOUNTER — Other Ambulatory Visit: Payer: Self-pay

## 2023-01-19 ENCOUNTER — Telehealth: Payer: Medicaid Other | Admitting: Physician Assistant

## 2023-01-19 DIAGNOSIS — F314 Bipolar disorder, current episode depressed, severe, without psychotic features: Secondary | ICD-10-CM

## 2023-01-23 DIAGNOSIS — F1410 Cocaine abuse, uncomplicated: Secondary | ICD-10-CM | POA: Insufficient documentation

## 2023-01-23 NOTE — Assessment & Plan Note (Signed)
Per pt, addictive personality, will use if friends around with it, otherwise does not seek it out. No use since hospital discharge. Will have her f/up with psychiatry to help stay clean.

## 2023-01-23 NOTE — Assessment & Plan Note (Signed)
Plan to start on Lithium 300 mg BID Will need to check trough level next week Pt aware of risks vs benefits and possible adverse reactions Will help manage until able to be seen with psychiatry  Close f/up with me in 2 weeks   [facility] info provided in case of any further SI (none today)

## 2023-01-23 NOTE — Patient Instructions (Signed)
If you develop suicidal thoughts, please tell someone and immediately proceed to our local 24/7 crisis center, [facility]. [address] [place], [place] [address] [phone].

## 2023-01-25 ENCOUNTER — Other Ambulatory Visit: Payer: Medicaid Other

## 2023-01-27 ENCOUNTER — Other Ambulatory Visit: Payer: Self-pay | Admitting: Physician Assistant

## 2023-01-27 ENCOUNTER — Other Ambulatory Visit: Payer: Medicaid Other

## 2023-01-27 DIAGNOSIS — F314 Bipolar disorder, current episode depressed, severe, without psychotic features: Secondary | ICD-10-CM

## 2023-01-28 LAB — LITHIUM LEVEL: Lithium Lvl: 0.4 mmol/L — ABNORMAL LOW (ref 0.6–1.2)

## 2023-01-30 ENCOUNTER — Other Ambulatory Visit: Payer: Self-pay

## 2023-01-30 ENCOUNTER — Other Ambulatory Visit: Payer: Self-pay | Admitting: Physician Assistant

## 2023-01-30 DIAGNOSIS — F32A Depression, unspecified: Secondary | ICD-10-CM

## 2023-01-30 DIAGNOSIS — F314 Bipolar disorder, current episode depressed, severe, without psychotic features: Secondary | ICD-10-CM

## 2023-01-31 ENCOUNTER — Encounter: Payer: Self-pay | Admitting: Physician Assistant

## 2023-01-31 NOTE — Telephone Encounter (Signed)
Last OV: 01/18/23  Next OV: 02/06/23  Last Filled: 01/18/23  Quantity: 30

## 2023-02-06 ENCOUNTER — Other Ambulatory Visit: Payer: Medicaid Other

## 2023-02-06 DIAGNOSIS — F314 Bipolar disorder, current episode depressed, severe, without psychotic features: Secondary | ICD-10-CM

## 2023-02-07 LAB — LITHIUM LEVEL: Lithium Lvl: 0.3 mmol/L — ABNORMAL LOW (ref 0.6–1.2)

## 2023-02-17 ENCOUNTER — Other Ambulatory Visit: Payer: Self-pay | Admitting: Physician Assistant

## 2023-02-17 NOTE — Telephone Encounter (Signed)
Please advise if ok to refill for patient

## 2023-02-22 ENCOUNTER — Other Ambulatory Visit (HOSPITAL_COMMUNITY): Payer: Self-pay

## 2023-02-23 ENCOUNTER — Other Ambulatory Visit (HOSPITAL_COMMUNITY): Payer: Self-pay

## 2023-03-07 NOTE — Progress Notes (Unsigned)
03/07/23 ALL: Faith Beck returns for Botox. Initial visit 06/2022 with Faith Beck. She reports tolerating procedure well. Migraines were improved but have returned. She is having daily migraines. She is taking Excedrin daily.   06/16/2022 JM: Patient is being seen for initial Botox visit.  She continues to have daily migraine type headaches. Does report benefit with higher dose of Ubrelvy, at times may need to take second dose.  Tolerated procedure well today. Will return in 3 months for repeat injection.     Consent Form Botulism Toxin Injection For Chronic Migraine    Reviewed orally with patient, additionally signature is on file:  Botulism toxin has been approved by the Federal drug administration for treatment of chronic migraine. Botulism toxin does not cure chronic migraine and it may not be effective in some patients.  The administration of botulism toxin is accomplished by injecting a small amount of toxin into the muscles of the neck and head. Dosage must be titrated for each individual. Any benefits resulting from botulism toxin tend to wear off after 3 months with a repeat injection required if benefit is to be maintained. Injections are usually done every 3-4 months with maximum effect peak achieved by about 2 or 3 weeks. Botulism toxin is expensive and you should be sure of what costs you will incur resulting from the injection.  The side effects of botulism toxin use for chronic migraine may include:   -Transient, and usually mild, facial weakness with facial injections  -Transient, and usually mild, head or neck weakness with head/neck injections  -Reduction or loss of forehead facial animation due to forehead muscle weakness  -Eyelid drooping  -Dry eye  -Pain at the site of injection or bruising at the site of injection  -Double vision  -Potential unknown long term risks   Contraindications: You should not have Botox if you are pregnant, nursing, allergic to albumin,  have an infection, skin condition, or muscle weakness at the site of the injection, or have myasthenia gravis, Lambert-Eaton syndrome, or ALS.  It is also possible that as with any injection, there may be an allergic reaction or no effect from the medication. Reduced effectiveness after repeated injections is sometimes seen and rarely infection at the injection site may occur. All care will be taken to prevent these side effects. If therapy is given over a long time, atrophy and wasting in the muscle injected may occur. Occasionally the patient's become refractory to treatment because they develop antibodies to the toxin. In this event, therapy needs to be modified.  I have read the above information and consent to the administration of botulism toxin.    BOTOX PROCEDURE NOTE FOR MIGRAINE HEADACHE  Contraindications and precautions discussed with patient(above). Aseptic procedure was observed and patient tolerated procedure. Procedure performed by Shawnie Dapper, FNP-C.   The condition has existed for more than 6 months, and pt does not have a diagnosis of ALS, Myasthenia Gravis or Lambert-Eaton Syndrome.  Risks and benefits of injections discussed and pt agrees to proceed with the procedure.  Written consent obtained  These injections are medically necessary. Pt  receives good benefits from these injections. These injections do not cause sedations or hallucinations which the oral therapies may cause.   Description of procedure:  The patient was placed in a sitting position. The standard protocol was used for Botox as follows, with 5 units of Botox injected at each site:  -Procerus muscle, midline injection  -Corrugator muscle, bilateral injection  -Frontalis muscle, bilateral  injection, with 2 sites each side, medial injection was performed in the upper one third of the frontalis muscle, in the region vertical from the medial inferior edge of the superior orbital rim. The lateral injection was  again in the upper one third of the forehead vertically above the lateral limbus of the cornea, 1.5 cm lateral to the medial injection site.  -Temporalis muscle injection, 4 sites, bilaterally. The first injection was 3 cm above the tragus of the ear, second injection site was 1.5 cm to 3 cm up from the first injection site in line with the tragus of the ear. The third injection site was 1.5-3 cm forward between the first 2 injection sites. The fourth injection site was 1.5 cm posterior to the second injection site. 5th site laterally in the temporalis  muscleat the level of the outer canthus.  -Occipitalis muscle injection, 3 sites, bilaterally. The first injection was done one half way between the occipital protuberance and the tip of the mastoid process behind the ear. The second injection site was done lateral and superior to the first, 1 fingerbreadth from the first injection. The third injection site was 1 fingerbreadth superiorly and medially from the first injection site.  -Cervical paraspinal muscle injection, 2 sites, bilaterally. The first injection site was 1 cm from the midline of the cervical spine, 3 cm inferior to the lower border of the occipital protuberance. The second injection site was 1.5 cm superiorly and laterally to the first injection site.  -Trapezius muscle injection was performed at 3 sites, bilaterally. The first injection site was in the upper trapezius muscle halfway between the inflection point of the neck, and the acromion. The second injection site was one half way between the acromion and the first injection site. The third injection was done between the first injection site and the inflection point of the neck.   Will return for repeat injection in 3 months.   A total of 200 units of Botox was prepared, 155 units of Botox was injected as documented above, any Botox not injected was wasted. The patient tolerated the procedure well, there were no complications of the  above procedure.

## 2023-03-08 ENCOUNTER — Encounter: Payer: Self-pay | Admitting: Family Medicine

## 2023-03-08 ENCOUNTER — Ambulatory Visit (INDEPENDENT_AMBULATORY_CARE_PROVIDER_SITE_OTHER): Payer: Medicaid Other | Admitting: Family Medicine

## 2023-03-08 DIAGNOSIS — G43019 Migraine without aura, intractable, without status migrainosus: Secondary | ICD-10-CM

## 2023-03-08 MED ORDER — ONABOTULINUMTOXINA 200 UNITS IJ SOLR
155.0000 [IU] | Freq: Once | INTRAMUSCULAR | Status: AC
Start: 2023-03-08 — End: 2023-03-08
  Administered 2023-03-08: 155 [IU] via INTRAMUSCULAR

## 2023-03-08 NOTE — Progress Notes (Signed)
Botox- 200 units x 1 vial ZOX:[id number] Expiration: 10/2024 NDC: 0981-1914-78  Bacteriostatic 0.9% Sodium Chloride- * mL  Lot: [id number] Expiration:08/08/2023 NDC:  Dx: G43.019 S/P Witnessed by: Marcelina Morel, RN

## 2023-03-14 ENCOUNTER — Other Ambulatory Visit: Payer: Self-pay | Admitting: Physician Assistant

## 2023-03-14 ENCOUNTER — Encounter: Payer: Self-pay | Admitting: Physician Assistant

## 2023-03-14 DIAGNOSIS — F419 Anxiety disorder, unspecified: Secondary | ICD-10-CM

## 2023-03-14 DIAGNOSIS — F314 Bipolar disorder, current episode depressed, severe, without psychotic features: Secondary | ICD-10-CM

## 2023-04-12 ENCOUNTER — Encounter: Payer: Self-pay | Admitting: Physician Assistant

## 2023-04-12 ENCOUNTER — Other Ambulatory Visit: Payer: Self-pay

## 2023-04-12 MED ORDER — VALACYCLOVIR HCL 500 MG PO TABS: 500.0000 mg | ORAL_TABLET | Freq: Two times a day (BID) | ORAL | 0 refills | Status: DC | PRN

## 2023-04-20 ENCOUNTER — Ambulatory Visit: Payer: Medicaid Other | Admitting: Physician Assistant

## 2023-04-23 ENCOUNTER — Other Ambulatory Visit: Payer: Self-pay | Admitting: Physician Assistant

## 2023-04-23 DIAGNOSIS — F32A Depression, unspecified: Secondary | ICD-10-CM

## 2023-04-23 DIAGNOSIS — F314 Bipolar disorder, current episode depressed, severe, without psychotic features: Secondary | ICD-10-CM

## 2023-04-24 NOTE — Telephone Encounter (Signed)
Last OV: 01/18/23  Next OV: 05/05/23  Last Filled: 03/14/23  Quantity: 90

## 2023-04-26 ENCOUNTER — Other Ambulatory Visit: Payer: Self-pay

## 2023-04-26 MED ORDER — BUPROPION HCL ER (XL) 150 MG PO TB24: 150.0000 mg | ORAL_TABLET | Freq: Every day | ORAL | 0 refills | Status: DC

## 2023-04-26 NOTE — Telephone Encounter (Signed)
Please see pt response to her refills for lithium and advise patient. Not received anything for Bupropion but will send in this refill for patient now.

## 2023-05-05 ENCOUNTER — Ambulatory Visit: Payer: Medicaid Other | Admitting: Physician Assistant

## 2023-05-08 ENCOUNTER — Telehealth: Payer: Self-pay | Admitting: Family Medicine

## 2023-05-08 DIAGNOSIS — G43019 Migraine without aura, intractable, without status migrainosus: Secondary | ICD-10-CM

## 2023-05-08 NOTE — Telephone Encounter (Signed)
Submitted auth renewal request via CMM, status is pending. Key: [id number]

## 2023-05-09 NOTE — Telephone Encounter (Signed)
 Received approval, pt will continue to fill through [facility].  Auth#: [id number] (05/08/23-05/07/24)

## 2023-05-23 ENCOUNTER — Other Ambulatory Visit: Payer: Self-pay

## 2023-05-26 ENCOUNTER — Other Ambulatory Visit: Payer: Self-pay

## 2023-05-29 ENCOUNTER — Other Ambulatory Visit: Payer: Self-pay

## 2023-05-30 ENCOUNTER — Other Ambulatory Visit: Payer: Self-pay

## 2023-05-30 NOTE — Telephone Encounter (Signed)
Will try again to reach out to pt/emergency contacts the next few days as a letter will likely not reach her in time.

## 2023-05-30 NOTE — Telephone Encounter (Signed)
Please send new Botox Rx to [facility], thank you!

## 2023-05-30 NOTE — Telephone Encounter (Signed)
Delivery is pending pt consent and a new fill. Myself and [facility] have been unable to reach the pt.

## 2023-05-30 NOTE — Telephone Encounter (Signed)
Can you mail out a letter also to pt? Thank you

## 2023-05-31 ENCOUNTER — Other Ambulatory Visit: Payer: Self-pay

## 2023-05-31 NOTE — Progress Notes (Deleted)
 05/31/23 ALL: Faith Beck returns for Botox.   03/08/2023 ALL: Faith Beck returns for Botox. Initial visit 06/2022 with [name]. She reports tolerating procedure well. Migraines were improved but have returned. She is having daily migraines. She is taking Excedrin daily.   06/16/2022 JM: Patient is being seen for initial Botox visit.  She continues to have daily migraine type headaches. Does report benefit with higher dose of Ubrelvy, at times may need to take second dose.  Tolerated procedure well today. Will return in 3 months for repeat injection.     Consent Form Botulism Toxin Injection For Chronic Migraine    Reviewed orally with patient, additionally signature is on file:  Botulism toxin has been approved by the Federal drug administration for treatment of chronic migraine. Botulism toxin does not cure chronic migraine and it may not be effective in some patients.  The administration of botulism toxin is accomplished by injecting a small amount of toxin into the muscles of the neck and head. Dosage must be titrated for each individual. Any benefits resulting from botulism toxin tend to wear off after 3 months with a repeat injection required if benefit is to be maintained. Injections are usually done every 3-4 months with maximum effect peak achieved by about 2 or 3 weeks. Botulism toxin is expensive and you should be sure of what costs you will incur resulting from the injection.  The side effects of botulism toxin use for chronic migraine may include:   -Transient, and usually mild, facial weakness with facial injections  -Transient, and usually mild, head or neck weakness with head/neck injections  -Reduction or loss of forehead facial animation due to forehead muscle weakness  -Eyelid drooping  -Dry eye  -Pain at the site of injection or bruising at the site of injection  -Double vision  -Potential unknown long term risks   Contraindications: You should not have Botox if  you are pregnant, nursing, allergic to albumin, have an infection, skin condition, or muscle weakness at the site of the injection, or have myasthenia gravis, Lambert-Eaton syndrome, or ALS.  It is also possible that as with any injection, there may be an allergic reaction or no effect from the medication. Reduced effectiveness after repeated injections is sometimes seen and rarely infection at the injection site may occur. All care will be taken to prevent these side effects. If therapy is given over a long time, atrophy and wasting in the muscle injected may occur. Occasionally the patient's become refractory to treatment because they develop antibodies to the toxin. In this event, therapy needs to be modified.  I have read the above information and consent to the administration of botulism toxin.    BOTOX PROCEDURE NOTE FOR MIGRAINE HEADACHE  Contraindications and precautions discussed with patient(above). Aseptic procedure was observed and patient tolerated procedure. Procedure performed by Shawnie Dapper, FNP-C.   The condition has existed for more than 6 months, and pt does not have a diagnosis of ALS, Myasthenia Gravis or Lambert-Eaton Syndrome.  Risks and benefits of injections discussed and pt agrees to proceed with the procedure.  Written consent obtained  These injections are medically necessary. Pt  receives good benefits from these injections. These injections do not cause sedations or hallucinations which the oral therapies may cause.   Description of procedure:  The patient was placed in a sitting position. The standard protocol was used for Botox as follows, with 5 units of Botox injected at each site:  -Procerus muscle, midline injection  -  Corrugator muscle, bilateral injection  -Frontalis muscle, bilateral injection, with 2 sites each side, medial injection was performed in the upper one third of the frontalis muscle, in the region vertical from the medial inferior edge of the  superior orbital rim. The lateral injection was again in the upper one third of the forehead vertically above the lateral limbus of the cornea, 1.5 cm lateral to the medial injection site.  -Temporalis muscle injection, 4 sites, bilaterally. The first injection was 3 cm above the tragus of the ear, second injection site was 1.5 cm to 3 cm up from the first injection site in line with the tragus of the ear. The third injection site was 1.5-3 cm forward between the first 2 injection sites. The fourth injection site was 1.5 cm posterior to the second injection site. 5th site laterally in the temporalis  muscleat the level of the outer canthus.  -Occipitalis muscle injection, 3 sites, bilaterally. The first injection was done one half way between the occipital protuberance and the tip of the mastoid process behind the ear. The second injection site was done lateral and superior to the first, 1 fingerbreadth from the first injection. The third injection site was 1 fingerbreadth superiorly and medially from the first injection site.  -Cervical paraspinal muscle injection, 2 sites, bilaterally. The first injection site was 1 cm from the midline of the cervical spine, 3 cm inferior to the lower border of the occipital protuberance. The second injection site was 1.5 cm superiorly and laterally to the first injection site.  -Trapezius muscle injection was performed at 3 sites, bilaterally. The first injection site was in the upper trapezius muscle halfway between the inflection point of the neck, and the acromion. The second injection site was one half way between the acromion and the first injection site. The third injection was done between the first injection site and the inflection point of the neck.   Will return for repeat injection in 3 months.   A total of 200 units of Botox was prepared, 155 units of Botox was injected as documented above, any Botox not injected was wasted. The patient tolerated the  procedure well, there were no complications of the above procedure.

## 2023-06-01 ENCOUNTER — Other Ambulatory Visit: Payer: Self-pay

## 2023-06-01 ENCOUNTER — Other Ambulatory Visit (HOSPITAL_COMMUNITY): Payer: Self-pay

## 2023-06-01 ENCOUNTER — Other Ambulatory Visit: Payer: Self-pay | Admitting: Physician Assistant

## 2023-06-01 DIAGNOSIS — F32A Depression, unspecified: Secondary | ICD-10-CM

## 2023-06-01 MED ORDER — BOTOX 200 UNITS IJ SOLR
INTRAMUSCULAR | 1 refills | Status: DC
  Filled 2023-06-01: qty 1, fill #0
  Filled 2023-06-01: qty 1, 34d supply, fill #0

## 2023-06-01 NOTE — Addendum Note (Signed)
Addended by: Arther Abbott on: 06/01/2023 08:23 AM   Modules accepted: Orders

## 2023-06-01 NOTE — Progress Notes (Signed)
Specialty Pharmacy Refill Coordination Note  Faith Beck is a 33 y.o. female assessed today regarding refills of clinic administered specialty medication(s) OnabotulinumtoxinA (Botox) Spoke with Alphonsa Gin at [facility]   Clinic requested Courier to Provider Office   Delivery date: 06/05/23   Verified address: [address] - [place]   Medication will be filled on 01.24.25.

## 2023-06-02 ENCOUNTER — Other Ambulatory Visit: Payer: Self-pay

## 2023-06-05 ENCOUNTER — Ambulatory Visit: Payer: Medicaid Other | Admitting: Family Medicine

## 2023-06-05 ENCOUNTER — Telehealth: Payer: Self-pay | Admitting: Family Medicine

## 2023-06-05 DIAGNOSIS — G43019 Migraine without aura, intractable, without status migrainosus: Secondary | ICD-10-CM

## 2023-06-05 NOTE — Telephone Encounter (Signed)
LVM and sent mychart msg informing pt of appt change due to NP being out on 06/06/23

## 2023-06-06 ENCOUNTER — Ambulatory Visit: Payer: Medicaid Other | Admitting: Family Medicine

## 2023-06-07 ENCOUNTER — Ambulatory Visit: Payer: Medicaid Other | Admitting: Family Medicine

## 2023-06-12 NOTE — Progress Notes (Deleted)
 06/12/23 ALL: Faith Beck returns for Botox .   03/08/2023 ALL: Faith Beck returns for Botox . Initial visit 06/2022 with Faith Beck. She reports tolerating procedure well. Migraines were improved but have returned. She is having daily migraines. She is taking Excedrin daily.   06/16/2022 JM: Patient is being seen for initial Botox  visit.  She continues to have daily migraine type headaches. Does report benefit with higher dose of Ubrelvy , at times may need to take second dose.  Tolerated procedure well today. Will return in 3 months for repeat injection.     Consent Form Botulism Toxin Injection For Chronic Migraine    Reviewed orally with patient, additionally signature is on file:  Botulism toxin has been approved by the Federal drug administration for treatment of chronic migraine. Botulism toxin does not cure chronic migraine and it may not be effective in some patients.  The administration of botulism toxin is accomplished by injecting a small amount of toxin into the muscles of the neck and head. Dosage must be titrated for each individual. Any benefits resulting from botulism toxin tend to wear off after 3 months with a repeat injection required if benefit is to be maintained. Injections are usually done every 3-4 months with maximum effect peak achieved by about 2 or 3 weeks. Botulism toxin is expensive and you should be sure of what costs you will incur resulting from the injection.  The side effects of botulism toxin use for chronic migraine may include:   -Transient, and usually mild, facial weakness with facial injections  -Transient, and usually mild, head or neck weakness with head/neck injections  -Reduction or loss of forehead facial animation due to forehead muscle weakness  -Eyelid drooping  -Dry eye  -Pain at the site of injection or bruising at the site of injection  -Double vision  -Potential unknown long term risks   Contraindications: You should not have Botox  if  you are pregnant, nursing, allergic to albumin, have an infection, skin condition, or muscle weakness at the site of the injection, or have myasthenia gravis, Lambert-Eaton syndrome, or ALS.  It is also possible that as with any injection, there may be an allergic reaction or no effect from the medication. Reduced effectiveness after repeated injections is sometimes seen and rarely infection at the injection site may occur. All care will be taken to prevent these side effects. If therapy is given over a long time, atrophy and wasting in the muscle injected may occur. Occasionally the patient's become refractory to treatment because they develop antibodies to the toxin. In this event, therapy needs to be modified.  I have read the above information and consent to the administration of botulism toxin.    BOTOX  PROCEDURE NOTE FOR MIGRAINE HEADACHE  Contraindications and precautions discussed with patient(above). Aseptic procedure was observed and patient tolerated procedure. Procedure performed by Greig Forbes, FNP-C.   The condition has existed for more than 6 months, and pt does not have a diagnosis of ALS, Myasthenia Gravis or Lambert-Eaton Syndrome.  Risks and benefits of injections discussed and pt agrees to proceed with the procedure.  Written consent obtained  These injections are medically necessary. Pt  receives good benefits from these injections. These injections do not cause sedations or hallucinations which the oral therapies may cause.   Description of procedure:  The patient was placed in a sitting position. The standard protocol was used for Botox  as follows, with 5 units of Botox  injected at each site:  -Procerus muscle, midline injection  -  Corrugator muscle, bilateral injection  -Frontalis muscle, bilateral injection, with 2 sites each side, medial injection was performed in the upper one third of the frontalis muscle, in the region vertical from the medial inferior edge of the  superior orbital rim. The lateral injection was again in the upper one third of the forehead vertically above the lateral limbus of the cornea, 1.5 cm lateral to the medial injection site.  -Temporalis muscle injection, 4 sites, bilaterally. The first injection was 3 cm above the tragus of the ear, second injection site was 1.5 cm to 3 cm up from the first injection site in line with the tragus of the ear. The third injection site was 1.5-3 cm forward between the first 2 injection sites. The fourth injection site was 1.5 cm posterior to the second injection site. 5th site laterally in the temporalis  muscleat the level of the outer canthus.  -Occipitalis muscle injection, 3 sites, bilaterally. The first injection was done one half way between the occipital protuberance and the tip of the mastoid process behind the ear. The second injection site was done lateral and superior to the first, 1 fingerbreadth from the first injection. The third injection site was 1 fingerbreadth superiorly and medially from the first injection site.  -Cervical paraspinal muscle injection, 2 sites, bilaterally. The first injection site was 1 cm from the midline of the cervical spine, 3 cm inferior to the lower border of the occipital protuberance. The second injection site was 1.5 cm superiorly and laterally to the first injection site.  -Trapezius muscle injection was performed at 3 sites, bilaterally. The first injection site was in the upper trapezius muscle halfway between the inflection point of the neck, and the acromion. The second injection site was one half way between the acromion and the first injection site. The third injection was done between the first injection site and the inflection point of the neck.   Will return for repeat injection in 3 months.   A total of 200 units of Botox  was prepared, 155 units of Botox  was injected as documented above, any Botox  not injected was wasted. The patient tolerated the  procedure well, there were no complications of the above procedure.

## 2023-06-14 ENCOUNTER — Ambulatory Visit: Payer: Medicaid Other | Admitting: Family Medicine

## 2023-06-14 DIAGNOSIS — G43019 Migraine without aura, intractable, without status migrainosus: Secondary | ICD-10-CM

## 2023-06-15 ENCOUNTER — Encounter: Payer: Self-pay | Admitting: Physician Assistant

## 2023-06-15 ENCOUNTER — Other Ambulatory Visit: Payer: Self-pay

## 2023-06-15 MED ORDER — BUPROPION HCL ER (XL) 150 MG PO TB24: 150.0000 mg | ORAL_TABLET | Freq: Every day | ORAL | 0 refills | Status: DC

## 2023-06-29 ENCOUNTER — Other Ambulatory Visit: Payer: Self-pay | Admitting: Physician Assistant

## 2023-06-29 DIAGNOSIS — F419 Anxiety disorder, unspecified: Secondary | ICD-10-CM

## 2023-06-29 DIAGNOSIS — F314 Bipolar disorder, current episode depressed, severe, without psychotic features: Secondary | ICD-10-CM

## 2023-06-29 DIAGNOSIS — F32A Depression, unspecified: Secondary | ICD-10-CM

## 2023-06-29 NOTE — Telephone Encounter (Signed)
 Last OV: 01/18/23  Next OV: None  Last Filled: 04/24/23  Quantity: 90

## 2023-07-13 ENCOUNTER — Ambulatory Visit: Payer: Medicaid Other | Admitting: Physician Assistant

## 2023-07-13 ENCOUNTER — Encounter: Payer: Self-pay | Admitting: Physician Assistant

## 2023-07-13 VITALS — BP 118/80 | HR 70 | Temp 98.1°F | Ht 63.0 in | Wt 247.2 lb

## 2023-07-13 DIAGNOSIS — R1030 Lower abdominal pain, unspecified: Secondary | ICD-10-CM

## 2023-07-13 DIAGNOSIS — F314 Bipolar disorder, current episode depressed, severe, without psychotic features: Secondary | ICD-10-CM

## 2023-07-13 DIAGNOSIS — F419 Anxiety disorder, unspecified: Secondary | ICD-10-CM | POA: Diagnosis not present

## 2023-07-13 DIAGNOSIS — F418 Other specified anxiety disorders: Secondary | ICD-10-CM | POA: Diagnosis not present

## 2023-07-13 DIAGNOSIS — F32A Depression, unspecified: Secondary | ICD-10-CM | POA: Diagnosis not present

## 2023-07-13 DIAGNOSIS — R3915 Urgency of urination: Secondary | ICD-10-CM | POA: Diagnosis not present

## 2023-07-13 LAB — POC URINALSYSI DIPSTICK (AUTOMATED)
Bilirubin, UA: NEGATIVE
Blood, UA: POSITIVE
Glucose, UA: NEGATIVE
Ketones, UA: NEGATIVE
Nitrite, UA: NEGATIVE
Protein, UA: POSITIVE — AB
Spec Grav, UA: 1.015 (ref 1.010–1.025)
Urobilinogen, UA: 0.2 U/dL
pH, UA: 6.5 (ref 5.0–8.0)

## 2023-07-13 LAB — COMPREHENSIVE METABOLIC PANEL
ALT: 11 U/L (ref 0–35)
AST: 11 U/L (ref 0–37)
Albumin: 3.8 g/dL (ref 3.5–5.2)
Alkaline Phosphatase: 89 U/L (ref 39–117)
BUN: 9 mg/dL (ref 6–23)
CO2: 22 meq/L (ref 19–32)
Calcium: 9.1 mg/dL (ref 8.4–10.5)
Chloride: 105 meq/L (ref 96–112)
Creatinine, Ser: 0.99 mg/dL (ref 0.40–1.20)
GFR: 74.84 mL/min (ref >60.00)
Glucose, Bld: 87 mg/dL (ref 70–99)
Potassium: 3.9 meq/L (ref 3.5–5.1)
Sodium: 138 meq/L (ref 135–145)
Total Bilirubin: 0.3 mg/dL (ref 0.2–1.2)
Total Protein: 6.9 g/dL (ref 6.0–8.3)

## 2023-07-13 LAB — CBC WITH DIFFERENTIAL/PLATELET
Basophils Absolute: 0.1 10*3/uL (ref 0.0–0.1)
Basophils Relative: 0.5 % (ref 0.0–3.0)
Eosinophils Absolute: 0.3 10*3/uL (ref 0.0–0.7)
Eosinophils Relative: 2.7 % (ref 0.0–5.0)
HCT: 39.8 % (ref 36.0–46.0)
Hemoglobin: 12.8 g/dL (ref 12.0–15.0)
Lymphocytes Relative: 15.3 % (ref 12.0–46.0)
Lymphs Abs: 1.7 10*3/uL (ref 0.7–4.0)
MCHC: 32.3 g/dL (ref 30.0–36.0)
MCV: 90.6 fl (ref 78.0–100.0)
Monocytes Absolute: 0.9 10*3/uL (ref 0.1–1.0)
Monocytes Relative: 8.3 % (ref 3.0–12.0)
Neutro Abs: 8.2 10*3/uL — ABNORMAL HIGH (ref 1.4–7.7)
Neutrophils Relative %: 73.2 % (ref 43.0–77.0)
Platelets: 416 10*3/uL — ABNORMAL HIGH (ref 150.0–400.0)
RBC: 4.39 Mil/uL (ref 3.87–5.11)
RDW: 16.3 % — ABNORMAL HIGH (ref 11.5–15.5)
WBC: 11.2 10*3/uL — ABNORMAL HIGH (ref 4.0–10.5)

## 2023-07-13 LAB — HEMOGLOBIN A1C: Hgb A1c MFr Bld: 5.1 % (ref 4.6–6.5)

## 2023-07-13 LAB — TSH: TSH: 2.05 u[IU]/mL (ref 0.35–5.50)

## 2023-07-13 MED ORDER — BUPROPION HCL ER (XL) 150 MG PO TB24
150.0000 mg | ORAL_TABLET | Freq: Every day | ORAL | 1 refills | Status: DC
Start: 2023-07-13 — End: 2024-01-12

## 2023-07-13 MED ORDER — LITHIUM CARBONATE 300 MG PO CAPS: 300.0000 mg | ORAL_CAPSULE | Freq: Three times a day (TID) | ORAL | 1 refills | Status: DC

## 2023-07-13 MED ORDER — CEPHALEXIN 500 MG PO CAPS: 500.0000 mg | ORAL_CAPSULE | Freq: Three times a day (TID) | ORAL | 0 refills | Status: AC

## 2023-07-13 NOTE — Progress Notes (Signed)
 Patient ID: Faith Beck, female    DOB: 1991-03-22, 33 y.o.   MRN: [id number]   Assessment & Plan:  Urinary urgency -     POCT Urinalysis Dipstick (Automated) -     Urine Culture  Lower abdominal pain -     Urine Culture  Bipolar 1 disorder, depressed, severe (HCC) -     Lithium Carbonate; Take 1 capsule (300 mg total) by mouth 3 (three) times daily with meals.  Dispense: 270 capsule; Refill: 1 -     CBC with Differential/Platelet -     Comprehensive metabolic panel -     TSH -     Hemoglobin A1c -     Lithium level  Anxiety and depression -     Lithium Carbonate; Take 1 capsule (300 mg total) by mouth 3 (three) times daily with meals.  Dispense: 270 capsule; Refill: 1 -     CBC with Differential/Platelet -     Comprehensive metabolic panel -     TSH -     Hemoglobin A1c -     Lithium level  Other orders -     Cephalexin; Take 1 capsule (500 mg total) by mouth 3 (three) times daily for 7 days.  Dispense: 21 capsule; Refill: 0 -     buPROPion HCl ER (XL); Take 1 tablet (150 mg total) by mouth daily.  Dispense: 90 tablet; Refill: 1   Assessment and Plan    Urinary Tract Infection (UTI) Symptoms and lab findings consistent with UTI. Differential includes nephrolithiasis, but recent stone manipulation likely resolved stones. Informed consent for antibiotics obtained. - Prescribe Keflex TID for 1 week. - Advise phenazopyridine for symptom relief. - Monitor symptoms and adjust treatment if necessary.  Nephrolithiasis History of nephrolithiasis with recent stone manipulation. Lithium use may increase calcium levels, contributing to stone formation. Discussed need for regular monitoring. - Monitor renal function and calcium levels. - Encourage increased water intake.  Bipolar Disorder Well-managed on lithium and Wellbutrin. No anxiety since discontinuing Buspar. Stable mental health with positive life changes.  - Continue lithium TID. - Continue Wellbutrin. -  Prescribe 90-day supply for both medications. - Monitor for mood changes or side effects. - Check lithium level, renal function, calcium, and thyroid function.  General Health Maintenance Discussed lifestyle factors including substance use and employment status. Encouraged healthy lifestyle to support well-being. - Encourage low caffeine and substance use. - Support job search efforts.         Return in about 6 months (around 01/13/2024) for recheck/follow-up.    Subjective:    Chief Complaint  Patient presents with   Medical Management of Chronic Issues    Pt in office for f/u and medication refills; pt having UTI symptoms; pt c/o urgency and some burning, lower abdominal pain and back; symptoms for a few days;     HPI Discussed the use of AI scribe software for clinical note transcription with the patient, who gave verbal consent to proceed.  History of Present Illness   Faith Beck is a 33 year old female with a history of kidney stones who presents with symptoms suggestive of a urinary tract infection.  She has been experiencing burning and increased frequency of urination for the past couple of days, with microscopic hematuria. She has a history of urinary tract infections and states that the current symptoms feel similar to previous episodes.  In January, she underwent a kidney stone manipulation procedure and believes she should not  have any remaining stones. She has a history of kidney stones dating back to 2014.  She is currently taking lithium three times a day and Wellbutrin, which are well-tolerated without side effects. She has not taken Buspar since August as she no longer experiences anxiety. She attributes the improvement in her anxiety to changes in her personal life, including leaving her partner and having a positive co-parenting relationship.  Socially, she is currently staying with her brother and is actively seeking employment. She has a job interview  scheduled and is hopeful for a position that does not require prolonged standing due to hip and back pain. She reports occasional alcohol and THC use, and has significantly reduced her caffeine intake to one cup of coffee in the morning.  No diarrhea, vomiting, tremors, drowsiness, muscle weakness, fatigue, constipation, or bone pain. She confirms she is not pregnant, having had a hysterectomy.       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)    Migraine without status migrainosus    Ulcer 2018    Past Surgical History:  Procedure Laterality Date   ABDOMINAL HYSTERECTOMY     still has L ovary   ABDOMINAL HYSTERECTOMY  2021   fibroids, endometriosis   CESAREAN SECTION     x3   CHOLECYSTECTOMY N/A 02/19/2015   Procedure: LAPAROSCOPIC CHOLECYSTECTOMY WITH INTRAOPERATIVE CHOLANGIOGRAM;  Surgeon: Darnell Level, MD;  Location: [facility];  Service: General;  Laterality: N/A;   DILATION AND CURETTAGE OF UTERUS     TUBAL LIGATION      Family History  Problem Relation Age of Onset   Asthma Mother    COPD Mother    Fibromyalgia Mother    Hypertension Mother    Hypertension Father    Cancer Father        leukemia   Diabetes Father    Heart disease Father    Pancreatic cancer Father    Alcohol abuse Father    Anxiety disorder Father    Drug abuse Father    Migraines Brother    Alcohol abuse Brother    Asthma Daughter    ADD / ADHD Daughter    Anxiety disorder Daughter    Learning disabilities Daughter    Healthy Son    ADD / ADHD Son    Intellectual disability Son    Learning disabilities Son    Anxiety disorder Maternal Grandmother    Heart disease Maternal Grandmother    Hypertension Maternal Grandmother    Anxiety disorder Maternal  Aunt    Colon cancer Neg Hx    Esophageal cancer Neg Hx    Rectal cancer Neg Hx    Stomach cancer Neg Hx     Social History   Tobacco Use   Smoking status: Former    Current packs/day: 0.00    Types: Cigarettes    Quit date: 06/11/2015    Years since quitting: 8.0   Smokeless tobacco: Never  Vaping Use   Vaping status: Never Used  Substance Use Topics   Alcohol use: Yes    Comment: rare once a month   Drug use: Yes    Types: Marijuana    Comment: occ  No Known Allergies  Review of Systems NEGATIVE UNLESS OTHERWISE INDICATED IN HPI      Objective:     BP 118/80 (BP Location: Left Arm, Patient Position: Sitting, Cuff Size: Normal)   Pulse 70   Temp 98.1 F (36.7 C) (Temporal)   Ht 5\' 3"  (1.6 m)   Wt 247 lb 3.2 oz (112.1 kg)   LMP 05/23/2018   SpO2 98%   BMI 43.79 kg/m   Wt Readings from Last 3 Encounters:  07/13/23 247 lb 3.2 oz (112.1 kg)  01/18/23 235 lb (106.6 kg)  10/19/22 240 lb 6.4 oz (109 kg)    BP Readings from Last 3 Encounters:  07/13/23 118/80  10/19/22 124/88  10/06/22 112/63     Physical Exam Vitals and nursing note reviewed.  Constitutional:      General: She is not in acute distress.    Appearance: Normal appearance. She is obese. She is not ill-appearing.  HENT:     Head: Normocephalic and atraumatic.  Cardiovascular:     Rate and Rhythm: Normal rate and regular rhythm.     Pulses: Normal pulses.     Heart sounds: Normal heart sounds.  Pulmonary:     Effort: Pulmonary effort is normal.     Breath sounds: Normal breath sounds.  Abdominal:     General: Abdomen is flat. Bowel sounds are normal.     Palpations: Abdomen is soft.     Tenderness: There is no right CVA tenderness or left CVA tenderness.  Skin:    General: Skin is warm and dry.  Neurological:     General: No focal deficit present.     Mental Status: She is alert.  Psychiatric:        Mood and Affect: Mood normal.     Seanna Sisler M Agustine Rossitto, PA-C

## 2023-07-14 LAB — LITHIUM LEVEL: Lithium Lvl: 1.2 mmol/L (ref 0.6–1.2)

## 2023-07-14 LAB — URINE CULTURE
MICRO NUMBER:: 16168308
SPECIMEN QUALITY:: ADEQUATE

## 2023-07-16 ENCOUNTER — Encounter: Payer: Self-pay | Admitting: Physician Assistant

## 2023-07-27 ENCOUNTER — Encounter: Payer: Self-pay | Admitting: Physician Assistant

## 2023-07-27 ENCOUNTER — Ambulatory Visit (INDEPENDENT_AMBULATORY_CARE_PROVIDER_SITE_OTHER): Admitting: Physician Assistant

## 2023-07-27 VITALS — BP 110/76 | HR 88 | Temp 97.5°F | Ht 63.0 in | Wt 255.0 lb

## 2023-07-27 DIAGNOSIS — R3915 Urgency of urination: Secondary | ICD-10-CM | POA: Diagnosis not present

## 2023-07-27 DIAGNOSIS — R1030 Lower abdominal pain, unspecified: Secondary | ICD-10-CM | POA: Diagnosis not present

## 2023-07-27 LAB — POC URINALSYSI DIPSTICK (AUTOMATED)
Bilirubin, UA: NEGATIVE
Blood, UA: NEGATIVE
Glucose, UA: NEGATIVE
Ketones, UA: NEGATIVE
Leukocytes, UA: NEGATIVE
Nitrite, UA: NEGATIVE
Protein, UA: NEGATIVE
Spec Grav, UA: 1.01 (ref 1.010–1.025)
Urobilinogen, UA: 0.2 U/dL
pH, UA: 7.5 (ref 5.0–8.0)

## 2023-07-27 NOTE — Progress Notes (Signed)
 Patient ID: Faith Beck, female    DOB: [birth year], 33 y.o.   MRN: [id number]   Assessment & Plan:  Urinary urgency -     POCT Urinalysis Dipstick (Automated)  Lower abdominal pain -     POCT Urinalysis Dipstick (Automated)      Assessment and Plan Assessment & Plan Left lower quadrant abdominal pain; urinary hesitancy, hx lithotripsy  Persistent left lower quadrant pain with urinary hesitancy, possible recurrent nephrolithiasis or ureteral obstruction. Previous lithotripsy history with similar symptoms. - Encourage increased water intake. - I personally contacted her urologist, discussed concerns, they attempted to work her in today but she had a vet emergency she had to take care of.  - Provide urology office number for follow-up, she can call them later this afternoon to schedule - Advise urology appointment for further evaluation. - ER if severe / worse changes       No follow-ups on file.    Subjective:    Chief Complaint  Patient presents with   Urinary Tract Infection    Pt in office c/o still having symptoms of UTI; pt states had relief for a few days but came back with same urgency and lower abdominal pain; Pt also asking for Doxycycline refill due to boil flare up    Urinary Tract Infection    Discussed the use of AI scribe software for clinical note transcription with the patient, who gave verbal consent to proceed.  History of Present Illness Faith Beck is a 33 year old female with a history of kidney stones who presents with ongoing urinary hesitancy and left lower quadrant pain.  She experiences pain in the left lower abdomen, particularly when needing to urinate, which persists for about thirty minutes afterward. This pain has been ongoing for a few weeks, with some improvement for a couple of days before returning. She describes the pain as a 6.5 out of ten when sitting, increasing significantly with pressure applied to the area. No  fever, chills, or diarrhea. Her appetite remains normal.  She reports urinary hesitancy, sometimes feeling the need to push to urinate. She denies any recent caffeine intake and reports occasional constipation. No visible blood in the urine, though the urine appears darker yellow.  She has a history of kidney stones and underwent bilateral lithotripsy in January. She recalls experiencing similar pain when she had kidney stones previously. During a visit on March 6th, there was a trace amount of blood in her urine, but the culture was negative for infection. She underwent a hysterectomy in the past and retains one ovary, though she is uncertain which side. She has not had any recent imaging or follow-up since her lithotripsy procedure.     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)    Migraine without status migrainosus    Ulcer 2018    Past Surgical History:  Procedure Laterality Date   ABDOMINAL HYSTERECTOMY     still has L ovary   ABDOMINAL HYSTERECTOMY  2021   fibroids, endometriosis   CESAREAN SECTION     x3   CHOLECYSTECTOMY N/A 02/19/2015   Procedure: LAPAROSCOPIC CHOLECYSTECTOMY WITH INTRAOPERATIVE CHOLANGIOGRAM;  Surgeon: Darnell Level,  MD;  Location: [facility];  Service: General;  Laterality: N/A;   DILATION AND CURETTAGE OF UTERUS     TUBAL LIGATION      Family History  Problem Relation Age of Onset   Asthma Mother    COPD Mother    Fibromyalgia Mother    Hypertension Mother    Hypertension Father    Cancer Father        leukemia   Diabetes Father    Heart disease Father    Pancreatic cancer Father    Alcohol abuse Father    Anxiety disorder Father    Drug abuse Father    Migraines Brother    Alcohol abuse Brother     Asthma Daughter    ADD / ADHD Daughter    Anxiety disorder Daughter    Learning disabilities Daughter    Healthy Son    ADD / ADHD Son    Intellectual disability Son    Learning disabilities Son    Anxiety disorder Maternal Grandmother    Heart disease Maternal Grandmother    Hypertension Maternal Grandmother    Anxiety disorder Maternal Aunt    Colon cancer Neg Hx    Esophageal cancer Neg Hx    Rectal cancer Neg Hx    Stomach cancer Neg Hx     Social History   Tobacco Use   Smoking status: Former    Current packs/day: 0.00    Types: Cigarettes    Quit date: 06/11/2015    Years since quitting: 8.1   Smokeless tobacco: Never  Vaping Use   Vaping status: Never Used  Substance Use Topics   Alcohol use: Yes    Comment: rare once a month   Drug use: Yes    Types: Marijuana    Comment: occ     No Known Allergies  Review of Systems NEGATIVE UNLESS OTHERWISE INDICATED IN HPI      Objective:     BP 110/76 (BP Location: Left Arm, Patient Position: Sitting, Cuff Size: Large)   Pulse 88   Temp (!) 97.5 F (36.4 C) (Temporal)   Ht 5\' 3"  (1.6 m)   Wt 255 lb (115.7 kg)   LMP 05/23/2018   SpO2 99%   BMI 45.17 kg/m   Wt Readings from Last 3 Encounters:  07/27/23 255 lb (115.7 kg)  07/13/23 247 lb 3.2 oz (112.1 kg)  01/18/23 235 lb (106.6 kg)    BP Readings from Last 3 Encounters:  07/27/23 110/76  07/13/23 118/80  10/19/22 124/88     Physical Exam Vitals and nursing note reviewed.  Constitutional:      General: She is not in acute distress.    Appearance: Normal appearance. She is not ill-appearing.  HENT:     Head: Normocephalic and atraumatic.  Cardiovascular:     Rate and Rhythm: Normal rate and regular rhythm.     Pulses: Normal pulses.     Heart sounds: Normal heart sounds.  Pulmonary:     Effort: Pulmonary effort is normal.     Breath sounds: Normal breath sounds.  Abdominal:     General: Abdomen is flat. Bowel sounds are normal.      Palpations: Abdomen is soft. There is no mass.     Tenderness: There is abdominal tenderness (LLQ and suprapubic). There is no right CVA tenderness, left CVA tenderness, guarding or rebound.  Skin:    General: Skin is warm and dry.  Neurological:     General: No focal deficit  present.     Mental Status: She is alert.  Psychiatric:        Mood and Affect: Mood normal.     Time Spent: 34 minutes of total time was spent on the date of the encounter performing the following actions: chart review prior to seeing the patient, obtaining history, performing a medically necessary exam, counseling on the treatment plan, placing orders, and documenting in our EHR.    Darcie Mellone M Lavon Horn, PA-C

## 2023-08-21 ENCOUNTER — Other Ambulatory Visit: Payer: Self-pay | Admitting: Physician Assistant

## 2023-08-30 ENCOUNTER — Encounter: Payer: Self-pay | Admitting: Gastroenterology

## 2023-08-30 ENCOUNTER — Other Ambulatory Visit: Payer: Self-pay

## 2023-08-30 ENCOUNTER — Encounter: Payer: Self-pay | Admitting: Physician Assistant

## 2023-08-30 DIAGNOSIS — F419 Anxiety disorder, unspecified: Secondary | ICD-10-CM

## 2023-08-30 DIAGNOSIS — F314 Bipolar disorder, current episode depressed, severe, without psychotic features: Secondary | ICD-10-CM

## 2023-08-30 MED ORDER — LITHIUM CARBONATE 300 MG PO CAPS: 300.0000 mg | ORAL_CAPSULE | Freq: Three times a day (TID) | ORAL | 1 refills | Status: DC

## 2023-08-30 NOTE — Telephone Encounter (Signed)
 Last OV: 07/27/23  Next OV: None  Last Filled: 07/13/23  Quantity: 270 w/ 1 refill

## 2023-08-31 ENCOUNTER — Other Ambulatory Visit: Payer: Self-pay

## 2023-08-31 MED ORDER — OMEPRAZOLE 40 MG PO CPDR: 40.0000 mg | DELAYED_RELEASE_CAPSULE | Freq: Two times a day (BID) | ORAL | 3 refills | Status: AC

## 2023-08-31 NOTE — Telephone Encounter (Signed)
 Rx sent on 08/30/23 by PCP, nothing further needed

## 2023-09-05 ENCOUNTER — Other Ambulatory Visit: Payer: Self-pay | Admitting: Physician Assistant

## 2023-09-05 NOTE — Telephone Encounter (Signed)
 Pt requesting refill on antibiotic.

## 2023-09-13 ENCOUNTER — Other Ambulatory Visit: Payer: Self-pay | Admitting: Physician Assistant

## 2023-09-13 ENCOUNTER — Ambulatory Visit (HOSPITAL_COMMUNITY): Admitting: Registered Nurse

## 2023-09-13 ENCOUNTER — Encounter (HOSPITAL_COMMUNITY): Payer: Self-pay

## 2023-09-13 DIAGNOSIS — F419 Anxiety disorder, unspecified: Secondary | ICD-10-CM

## 2023-09-14 ENCOUNTER — Ambulatory Visit: Payer: Medicaid Other | Admitting: Family Medicine

## 2023-09-14 NOTE — Progress Notes (Signed)
Patient was not seen related to no show 

## 2023-09-18 ENCOUNTER — Encounter: Payer: Self-pay | Admitting: Physician Assistant

## 2023-09-18 ENCOUNTER — Ambulatory Visit: Payer: Self-pay | Admitting: Physician Assistant

## 2023-09-18 LAB — LAB REPORT - SCANNED: EGFR: 81

## 2023-09-18 NOTE — Telephone Encounter (Signed)
 Chief Complaint: Numbness on left side intermittent since 10 AM on Saturday  Symptoms: 8/10 constant pain level, more frequent migraines (had one yesterday), dizziness with changes in position, unsteady on feet since Saturday Frequency: Constant pain, intermittent numbness Pertinent Negatives: Patient denies chest pain, difficulty breathing, heart palpitations, vision loss, double vision, changes in speech  Disposition: [x] ED  Additional Notes: Pt states around 10 AM on Saturday while at work she experienced numbness on her left hand ring finger and pinky finger. This traveled to pt's whole hand then went down her right leg and toes. Pt states yesterday she started experiencing pain that is constant. Pt advised to go to ED. This RN offered to call pt an ambulance but pt refused. Pt states she has someone to take her there now.  Reason for Disposition  [1] Numbness (i.e., loss of sensation) of the face, arm / hand, or leg / foot on one side of the body AND [2] sudden onset AND [3] present now  Answer Assessment - Initial Assessment Questions 1. SYMPTOM: "What is the main symptom you are concerned about?" (e.g., weakness, numbness)     Numbness 2. ONSET: "When did this start?" (minutes, hours, days; while sleeping)     Saturday 3. LAST NORMAL: "When was the last time you (the patient) were normal (no symptoms)?"     10 AM on Saturday 4. PATTERN "Does this come and go, or has it been constant since it started?"  "Is it present now?"     Constant pain, intermittent numbness 5. CARDIAC SYMPTOMS: "Have you had any of the following symptoms: chest pain, difficulty breathing, palpitations?"     Denies symptoms 6. NEUROLOGIC SYMPTOMS: "Have you had any of the following symptoms: headache, dizziness, vision loss, double vision, changes in speech, unsteady on your feet?"     Unsteady on feet since Saturday  Protocols used: Neurologic Deficit-A-AH

## 2023-09-18 NOTE — Telephone Encounter (Signed)
FYI - Pt advised to go to ED

## 2023-09-19 ENCOUNTER — Encounter: Payer: Self-pay | Admitting: Physician Assistant

## 2023-09-19 ENCOUNTER — Ambulatory Visit (INDEPENDENT_AMBULATORY_CARE_PROVIDER_SITE_OTHER): Admitting: Physician Assistant

## 2023-09-19 VITALS — BP 122/82 | HR 108 | Temp 98.1°F | Ht 63.0 in

## 2023-09-19 DIAGNOSIS — G8929 Other chronic pain: Secondary | ICD-10-CM

## 2023-09-19 DIAGNOSIS — M5441 Lumbago with sciatica, right side: Secondary | ICD-10-CM

## 2023-09-19 DIAGNOSIS — R790 Abnormal level of blood mineral: Secondary | ICD-10-CM

## 2023-09-19 DIAGNOSIS — G43709 Chronic migraine without aura, not intractable, without status migrainosus: Secondary | ICD-10-CM | POA: Diagnosis not present

## 2023-09-19 MED ORDER — METHYLPREDNISOLONE 4 MG PO TBPK: ORAL_TABLET | ORAL | 0 refills | Status: DC

## 2023-09-19 MED ORDER — MAGNESIUM OXIDE -MG SUPPLEMENT 200 MG PO TABS: 1.0000 | ORAL_TABLET | Freq: Every evening | ORAL | 2 refills | Status: AC

## 2023-09-19 MED ORDER — BACLOFEN 10 MG PO TABS: 10.0000 mg | ORAL_TABLET | Freq: Three times a day (TID) | ORAL | 0 refills | Status: AC

## 2023-09-19 NOTE — Patient Instructions (Signed)
  VISIT SUMMARY: During your visit, we discussed your ongoing migraines, new symptoms of numbness and pain on the left side of your body, and your recent ER visit. We also addressed your low magnesium levels and anxiety related to personal stressors.  YOUR PLAN: MIGRAINE: You have chronic migraines, and your current episode includes a left-sided headache with numbness and pain extending from your left arm to your toes. -We are prescribing a prednisone  taper to help reduce inflammation and pain. -Please rest, stretch, and stay hydrated. -Return to the ER if your symptoms worsen or if you experience new symptoms like slurred speech or dropping objects.  SCIATICA: You have pain and numbness on the left side of your body, which may be related to sciatica or muscle spasms. -We are prescribing baclofen  as a muscle relaxer to be taken throughout the day. -Avoid naproxen  due to its potential interaction with lithium .  LOW MAGNESIUM LEVEL: Your magnesium levels were found to be slightly low during your recent ER visit. -Take over-the-counter magnesium supplements at bedtime to minimize stomach side effects.  ANXIETY: You are experiencing increased anxiety due to recent personal and financial stressors, which may be contributing to the sensation of chest heaviness. -Practice stress management techniques and seek support as needed.                      Contains text generated by [employer].                                 Contains text generated by [employer].

## 2023-09-19 NOTE — Progress Notes (Signed)
 Patient ID: Faith Beck, female    DOB: 07/19/1990, 33 y.o.   MRN: [id number]   Assessment & Plan:  Low magnesium level -     Magnesium Oxide -Mg Supplement; Take 1 tablet (200 mg total) by mouth every evening.  Dispense: 30 tablet; Refill: 2  Chronic bilateral low back pain with right-sided sciatica  Other orders -     Baclofen ; Take 1 tablet (10 mg total) by mouth 3 (three) times daily. Take for muscle relaxation.  Dispense: 30 each; Refill: 0 -     methylPREDNISolone ; Please take per packaging instructions.  Dispense: 21 tablet; Refill: 0   Assessment & Plan Migraine Chronic migraines with current episode presenting with left-sided headache and associated numbness and pain extending from the left arm to the toes. Previous treatments including rizatriptan  and Nurtec have been ineffective. Botox  injections have reduced frequency but not eliminated migraines. Current episode may be exacerbated by stress and anxiety. Symptoms are consistent with a complex migraine, but other neurological causes have been ruled out. - Prescribe prednisone  taper to reduce inflammation and pain. - Advise rest, stretching, and hydration. - Instruct to return to ER if symptoms worsen or new symptoms such as slurred speech or dropping objects occur.  Sciatica Pain and numbness extending from the left side of the body, possibly related to sciatica or muscle spasm. Symptoms are more severe than previous sciatica episodes. Consideration of muscle relaxers and anti-inflammatory treatment to alleviate symptoms. - Prescribe baclofen  as a muscle relaxer to be taken throughout the day. - Advise taking NSAIDs with food to avoid gastrointestinal upset. - Avoid naproxen  due to potential interaction with lithium .  Low magnesium level Slightly low magnesium level noted during recent ER visit. Potential contributing factor to current symptoms. Magnesium supplementation is recommended to address deficiency and may  help alleviate symptoms. - Recommend over-the-counter magnesium supplements, to be taken at bedtime to minimize gastrointestinal side effects.  Anxiety Increased anxiety due to recent personal and financial stressors, including housing instability and financial difficulties. Anxiety may be contributing to the sensation of chest heaviness. - Encourage stress management techniques and support.      No follow-ups on file.    Subjective:    Chief Complaint  Patient presents with   Follow-up    Pain and numbness in left side of body, per triage note advised to go to ED and seen in [facility] Ed states may be migraine related; numbness started Saturday and migraine on Sunday; labs from ED in chart    HPI Discussed the use of AI scribe software for clinical note transcription with the patient, who gave verbal consent to proceed.  History of Present Illness Faith Beck is a 33 year old female with a history of migraines who presents with numbness and pain on the left side of her body.  Three days ago, she began experiencing numbness in her left hand, which then spread up her arm, down her side, and into her toes. The pain has been constant since it began, making it difficult to walk. She initially thought it might be muscle-related due to her work activities, but a back massage did not alleviate the symptoms.  She visited the ER at [facility] where a CT scan of the head was performed, and her magnesium levels were found to be slightly low. She received treatment with Toradol , Decadron , Reglan, and magnesium through an IV, which provided temporary relief while lying down. However, her symptoms have persisted and  remain unchanged today.  She has a history of migraines, typically on the left side of her head, and has been treated with Botox  injections in the past, which reduced the frequency of her migraines. However, she was unable to continue the injections due to financial  constraints. She currently has a prescription for rizatriptan  but has not taken it as it has been ineffective in the past. She has also tried Nurtec and Ubrelvy  without success.  She experiences anxiety, which she attributes to recent personal stressors, including housing instability and financial difficulties. She describes a heavy feeling in her chest, which she associates with anxiety.  She is currently taking gabapentin  300 mg three times a day and lithium , but she has not taken any muscle relaxers recently. She has used ibuprofen  and Goody's powder for pain relief, which provided some relief. No new symptoms such as slurred speech or dropping things.     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)    Migraine without status migrainosus    Ulcer 2018    Past Surgical History:  Procedure Laterality Date   ABDOMINAL HYSTERECTOMY     still has L ovary   ABDOMINAL HYSTERECTOMY  2021   fibroids, endometriosis   CESAREAN SECTION     x3   CHOLECYSTECTOMY N/A 02/19/2015   Procedure: LAPAROSCOPIC CHOLECYSTECTOMY WITH INTRAOPERATIVE CHOLANGIOGRAM;  Surgeon: Oralee Billow, MD;  Location: [facility];  Service: General;  Laterality: N/A;   DILATION AND CURETTAGE OF UTERUS     TUBAL LIGATION      Family History  Problem Relation Age of Onset   Asthma Mother    COPD Mother    Fibromyalgia Mother    Hypertension Mother    Hypertension Father    Cancer Father        leukemia   Diabetes Father    Heart disease Father    Pancreatic cancer Father    Alcohol abuse Father    Anxiety disorder Father    Drug abuse Father    Migraines Brother    Alcohol abuse Brother    Asthma Daughter    ADD / ADHD Daughter    Anxiety disorder Daughter    Learning  disabilities Daughter    Healthy Son    ADD / ADHD Son    Intellectual disability Son    Learning disabilities Son    Anxiety disorder Maternal Grandmother    Heart disease Maternal Grandmother    Hypertension Maternal Grandmother    Anxiety disorder Maternal Aunt    Colon cancer Neg Hx    Esophageal cancer Neg Hx    Rectal cancer Neg Hx    Stomach cancer Neg Hx     Social History   Tobacco Use   Smoking status: Former    Current packs/day: 0.00    Types: Cigarettes    Quit date: 06/11/2015    Years since quitting: 8.2   Smokeless tobacco: Never  Vaping Use   Vaping status: Never Used  Substance Use Topics   Alcohol use: Yes    Comment: rare once a month   Drug use: Yes    Types: Marijuana    Comment: occ  No Known Allergies  Review of Systems NEGATIVE UNLESS OTHERWISE INDICATED IN HPI      Objective:     BP 122/82 (BP Location: Left Arm, Patient Position: Sitting, Cuff Size: Large)   Pulse (!) 108   Temp 98.1 F (36.7 C) (Oral)   Ht 5\' 3"  (1.6 m)   LMP 05/23/2018   SpO2 96%   BMI 45.17 kg/m   Wt Readings from Last 3 Encounters:  07/27/23 255 lb (115.7 kg)  07/13/23 247 lb 3.2 oz (112.1 kg)  01/18/23 235 lb (106.6 kg)    BP Readings from Last 3 Encounters:  09/19/23 122/82  07/27/23 110/76  07/13/23 118/80     Physical Exam Vitals and nursing note reviewed.  Constitutional:      Appearance: Normal appearance. She is normal weight. She is not toxic-appearing.  HENT:     Head: Normocephalic and atraumatic.     Right Ear: Tympanic membrane, ear canal and external ear normal.     Left Ear: Tympanic membrane, ear canal and external ear normal.     Nose: Nose normal.     Mouth/Throat:     Mouth: Mucous membranes are moist.  Eyes:     Extraocular Movements: Extraocular movements intact.     Conjunctiva/sclera: Conjunctivae normal.     Pupils: Pupils are equal, round, and reactive to light.  Cardiovascular:     Rate and Rhythm: Normal rate  and regular rhythm.     Pulses: Normal pulses.     Heart sounds: Normal heart sounds.  Pulmonary:     Effort: Pulmonary effort is normal.     Breath sounds: Normal breath sounds.  Musculoskeletal:        General: Normal range of motion.     Cervical back: Normal range of motion and neck supple.  Skin:    General: Skin is warm and dry.  Neurological:     General: No focal deficit present.     Mental Status: She is alert and oriented to person, place, and time.     Cranial Nerves: No cranial nerve deficit.     Sensory: No sensory deficit.     Motor: No weakness.     Coordination: Coordination normal.     Gait: Gait normal.  Psychiatric:        Mood and Affect: Mood normal.        Behavior: Behavior normal.             Anuhea Gassner M Shakenya Stoneberg, PA-C

## 2023-09-20 ENCOUNTER — Ambulatory Visit (HOSPITAL_COMMUNITY): Admitting: Registered Nurse

## 2023-09-20 ENCOUNTER — Encounter (HOSPITAL_COMMUNITY): Payer: Self-pay

## 2023-10-04 ENCOUNTER — Encounter (HOSPITAL_COMMUNITY): Payer: Self-pay

## 2023-10-04 ENCOUNTER — Ambulatory Visit (HOSPITAL_COMMUNITY): Admitting: Registered Nurse

## 2023-10-19 ENCOUNTER — Encounter: Payer: Self-pay | Admitting: Physician Assistant

## 2023-11-01 ENCOUNTER — Ambulatory Visit (HOSPITAL_COMMUNITY): Admitting: Registered Nurse

## 2023-11-01 ENCOUNTER — Encounter (HOSPITAL_COMMUNITY): Payer: Self-pay

## 2023-11-03 ENCOUNTER — Encounter: Payer: Self-pay | Admitting: Physician Assistant

## 2023-11-12 ENCOUNTER — Other Ambulatory Visit: Payer: Self-pay | Admitting: Physician Assistant

## 2023-12-05 ENCOUNTER — Other Ambulatory Visit: Payer: Self-pay

## 2023-12-20 ENCOUNTER — Other Ambulatory Visit: Payer: Self-pay

## 2023-12-21 ENCOUNTER — Other Ambulatory Visit: Payer: Self-pay

## 2023-12-21 NOTE — Progress Notes (Signed)
 Patient has not received initial injection sent in January. Dis-enrolling.

## 2024-01-12 ENCOUNTER — Other Ambulatory Visit: Payer: Self-pay | Admitting: Physician Assistant

## 2024-01-24 DIAGNOSIS — M25562 Pain in left knee: Secondary | ICD-10-CM | POA: Insufficient documentation

## 2024-02-13 ENCOUNTER — Encounter: Payer: Self-pay | Admitting: Physician Assistant

## 2024-02-14 ENCOUNTER — Other Ambulatory Visit: Payer: Self-pay | Admitting: Physician Assistant

## 2024-02-14 DIAGNOSIS — F32A Depression, unspecified: Secondary | ICD-10-CM

## 2024-02-15 NOTE — Telephone Encounter (Signed)
 Last OV: 09/19/23  Next OV: 02/23/24  Last filled: 09/14/23  Quantity: 270

## 2024-02-16 ENCOUNTER — Other Ambulatory Visit: Payer: Self-pay | Admitting: Gastroenterology

## 2024-02-23 ENCOUNTER — Encounter: Payer: Self-pay | Admitting: Physician Assistant

## 2024-02-23 ENCOUNTER — Ambulatory Visit (INDEPENDENT_AMBULATORY_CARE_PROVIDER_SITE_OTHER): Admitting: Physician Assistant

## 2024-02-23 VITALS — BP 144/89 | HR 107 | Temp 98.4°F | Ht 63.0 in | Wt 267.0 lb

## 2024-02-23 DIAGNOSIS — Z1159 Encounter for screening for other viral diseases: Secondary | ICD-10-CM

## 2024-02-23 DIAGNOSIS — G5622 Lesion of ulnar nerve, left upper limb: Secondary | ICD-10-CM

## 2024-02-23 DIAGNOSIS — F314 Bipolar disorder, current episode depressed, severe, without psychotic features: Secondary | ICD-10-CM | POA: Diagnosis not present

## 2024-02-23 DIAGNOSIS — R5383 Other fatigue: Secondary | ICD-10-CM

## 2024-02-23 DIAGNOSIS — Z5900 Homelessness unspecified: Secondary | ICD-10-CM

## 2024-02-23 DIAGNOSIS — R635 Abnormal weight gain: Secondary | ICD-10-CM | POA: Diagnosis not present

## 2024-02-23 DIAGNOSIS — L0100 Impetigo, unspecified: Secondary | ICD-10-CM

## 2024-02-23 DIAGNOSIS — F1410 Cocaine abuse, uncomplicated: Secondary | ICD-10-CM

## 2024-02-23 DIAGNOSIS — Z114 Encounter for screening for human immunodeficiency virus [HIV]: Secondary | ICD-10-CM

## 2024-02-23 LAB — CBC WITH DIFFERENTIAL/PLATELET
Basophils Absolute: 0.1 K/uL (ref 0.0–0.1)
Basophils Relative: 1.1 % (ref 0.0–3.0)
Eosinophils Absolute: 0.5 K/uL (ref 0.0–0.7)
Eosinophils Relative: 5.5 % — ABNORMAL HIGH (ref 0.0–5.0)
HCT: 38.7 % (ref 36.0–46.0)
Hemoglobin: 12.4 g/dL (ref 12.0–15.0)
Lymphocytes Relative: 17.5 % (ref 12.0–46.0)
Lymphs Abs: 1.4 K/uL (ref 0.7–4.0)
MCHC: 32.1 g/dL (ref 30.0–36.0)
MCV: 88.7 fl (ref 78.0–100.0)
Monocytes Absolute: 0.6 K/uL (ref 0.1–1.0)
Monocytes Relative: 7 % (ref 3.0–12.0)
Neutro Abs: 5.7 K/uL (ref 1.4–7.7)
Neutrophils Relative %: 68.9 % (ref 43.0–77.0)
Platelets: 332 K/uL (ref 150.0–400.0)
RBC: 4.37 Mil/uL (ref 3.87–5.11)
RDW: 15.6 % — ABNORMAL HIGH (ref 11.5–15.5)
WBC: 8.3 K/uL (ref 4.0–10.5)

## 2024-02-23 LAB — COMPREHENSIVE METABOLIC PANEL WITH GFR
ALT: 11 U/L (ref 0–35)
AST: 14 U/L (ref 0–37)
Albumin: 4 g/dL (ref 3.5–5.2)
Alkaline Phosphatase: 81 U/L (ref 39–117)
BUN: 12 mg/dL (ref 6–23)
CO2: 29 meq/L (ref 19–32)
Calcium: 9.5 mg/dL (ref 8.4–10.5)
Chloride: 105 meq/L (ref 96–112)
Creatinine, Ser: 0.87 mg/dL (ref 0.40–1.20)
GFR: 87.02 mL/min (ref >60.00)
Glucose, Bld: 80 mg/dL (ref 70–99)
Potassium: 4 meq/L (ref 3.5–5.1)
Sodium: 141 meq/L (ref 135–145)
Total Bilirubin: 0.6 mg/dL (ref 0.2–1.2)
Total Protein: 6.9 g/dL (ref 6.0–8.3)

## 2024-02-23 LAB — HEMOGLOBIN A1C: Hgb A1c MFr Bld: 5.2 % (ref 4.6–6.5)

## 2024-02-23 LAB — TSH: TSH: 3.26 u[IU]/mL (ref 0.35–5.50)

## 2024-02-23 MED ORDER — MUPIROCIN 2 % EX OINT: 1.0000 | TOPICAL_OINTMENT | Freq: Three times a day (TID) | CUTANEOUS | 0 refills | Status: DC

## 2024-02-23 MED ORDER — MELOXICAM 15 MG PO TABS: 15.0000 mg | ORAL_TABLET | Freq: Every day | ORAL | 0 refills | Status: AC

## 2024-02-23 NOTE — Progress Notes (Signed)
 Patient ID: Faith Beck, female    DOB: 11-19-1990, 33 y.o.   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  Dispense: 15 tablet; Refill: 0 -     Ambulatory referral to Sports Medicine  Other fatigue -     TSH -     Lithium  level -     CBC with Differential/Platelet -     Comprehensive metabolic panel with GFR -     Hemoglobin A1c  Abnormal weight gain -     TSH -     Lithium  level -     CBC with Differential/Platelet -     Comprehensive metabolic panel with GFR -     Hemoglobin A1c  Impetigo -     Mupirocin ; Apply 1 Application topically 3 (three) times daily.  Dispense: 22 g; Refill: 0  Encounter for screening for HIV -     HIV Antibody (routine testing w rflx)  Need for hepatitis C screening test -     Hepatitis C antibody  Drug abuse, cocaine type (HCC)      Assessment and Plan Assessment & Plan Left ulnar nerve neuropathy Intermittent numbness and tingling in the left hand, particularly affecting the ulnar nerve distribution. Symptoms have been present intermittently for a few years but have become constant over the past three weeks. Shoulder dislocation occurred two months ago. 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 Differential includes compression neuropathy. - Prescribe meloxicam  for 10 days, to be taken with food to avoid stomach upset - Refer to sports medicine if symptoms persist in two weeks for further evaluation, including potential imaging  Impetigo  near R eye Crusty appearance suggestive of impetigo. Previous similar infection noted. - Prescribe mupirocin  cream for topical application  Hypothyroidism Symptoms suggestive of hypothyroidism recurrence, including weight gain, fatigue, hair loss, and facial hair growth. Last thyroid  check was seven months ago. - Order thyroid  function tests - Check lithium  levels as part of routine monitoring  Bipolar disorder Well-managed on lithium  and gabapentin , though adherence to midday dosing is challenging due to busy schedule. No recent suicidal ideation reported. Mood stabilizer appears effective. - Refer to psychiatry for in-person evaluation and potential medication management - Check lithium  levels as part of routine monitoring - Refer to psychiatry for evaluation and potential treatment options  Cocaine use disorder, in remission In remission with no cocaine use since Memorial Day. Occasional THC use and rare alcohol consumption reported.  Homelessness Currently homeless, living primarily in a car with her boyfriend. Recently started a new full-time job, which may improve financial situation. - Discuss potential resources for housing assistance with psychiatry referral      Return in about 3 months (around 05/25/2024) for recheck/follow-up.    Subjective:    Chief Complaint  Patient presents with   Numbness  Pt c/o numbness and tingling in left hand pinky and ring finger. Present for 2 weeks.     HPI Discussed the use of AI scribe software for clinical note transcription with the patient, who gave verbal consent to proceed.  History of Present Illness Faith Beck is a 33 year old female who presents with numbness and tingling in her fingers and concerns about thyroid  symptoms. Here with a friend.  She has been experiencing constant numbness and tingling in her left hand, specifically in two fingers, for the past three weeks. The sensation is described as 'pins and  needles' and does not resolve with any activity. Being left-handed, this has impacted her ability to perform daily tasks. 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 She has been taking meloxicam  and naproxen  as needed for a knee injury but notes these do not alleviate the numbness.  She is concerned about her thyroid  health, noting symptoms such as weight gain despite a healthy diet and active lifestyle, fatigue, increased facial hair growth, and hair loss. She mentions a history of thyroid  issues and it has been seven months since her thyroid  was last checked. She also reports a lack of appetite and recent weight measurements showing an increase from 267 to 276 pounds.  She describes an eye issue that began with symptoms resembling a sty, but she suspects it may be an infection similar to one she experienced in June. Her eye was matted shut upon waking, and she notes a crusty appearance developing, which she associates with impetigo.  She has a history of a UTI and kidney stones a few weeks ago, which led to severe illness, including vomiting and sleeping for three days. She was diagnosed with a UTI and a kidney stone at urgent care.  She is currently homeless, primarily living in a car with her boyfriend, and occasionally staying with friends or family. She recently started a new full-time job after being unemployed. She struggles with medication adherence, particularly with lithium  and gabapentin , which she is supposed to take three times a day. She sometimes misses doses due to her busy schedule.  She has a history of mental health issues, including anxiety and difficulty concentrating. She has not been able to connect with psychiatry services due to issues with virtual appointments. She has a history of substance use but reports significant improvement, having abstained from  cocaine since May and only occasionally using THC and alcohol.     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)    Migraine without status migrainosus    Ulcer 2018    Past Surgical History:  Procedure Laterality Date   ABDOMINAL HYSTERECTOMY     still has L ovary   ABDOMINAL HYSTERECTOMY  2021   fibroids, endometriosis   CESAREAN SECTION     x3   CHOLECYSTECTOMY N/A 02/19/2015   Procedure: LAPAROSCOPIC CHOLECYSTECTOMY WITH INTRAOPERATIVE CHOLANGIOGRAM;  Surgeon: Krystal Spinner, MD;  Location: [facility];  Service: General;  Laterality: N/A;   DILATION AND CURETTAGE OF UTERUS     TUBAL LIGATION  Family History  Problem Relation Age of Onset   Asthma Mother    COPD Mother    Fibromyalgia Mother    Hypertension Mother    Hypertension Father    Cancer Father        leukemia   Diabetes Father    Heart disease Father    Pancreatic cancer Father    Alcohol abuse Father    Anxiety disorder Father    Drug abuse Father    Migraines Brother    Alcohol abuse Brother    Asthma Daughter    ADD / ADHD Daughter    Anxiety disorder Daughter    Learning disabilities Daughter    Healthy Son    ADD / ADHD Son    Intellectual disability Son    Learning disabilities Son    Anxiety disorder Maternal Grandmother    Heart disease Maternal Grandmother    Hypertension Maternal Grandmother    Anxiety disorder Maternal Aunt    Colon cancer Neg Hx    Esophageal cancer Neg Hx    Rectal cancer Neg Hx    Stomach cancer Neg Hx     Social History   Tobacco Use   Smoking status: Former    Current packs/day: 0.00    Types: Cigarettes    Quit date: 06/11/2015    Years since quitting: 8.7   Smokeless tobacco: Never  Vaping Use   Vaping status:  Never Used  Substance Use Topics   Alcohol use: Yes    Comment: rare once a month   Drug use: Yes    Types: Marijuana    Comment: occ     No Known Allergies  Review of Systems NEGATIVE UNLESS OTHERWISE INDICATED IN HPI      Objective:     BP (!) 144/89 (BP Location: Right Arm, Patient Position: Sitting, Cuff Size: Large)   Pulse (!) 107   Temp 98.4 F (36.9 C) (Temporal)   Ht 5' 3 (1.6 m)   Wt 267 lb (121.1 kg)   LMP 05/23/2018   SpO2 97%   BMI 47.30 kg/m   Wt Readings from Last 3 Encounters:  02/23/24 267 lb (121.1 kg)  07/27/23 255 lb (115.7 kg)  07/13/23 247 lb 3.2 oz (112.1 kg)    BP Readings from Last 3 Encounters:  02/23/24 (!) 144/89  09/19/23 122/82  07/27/23 110/76     Physical Exam Vitals and nursing note reviewed.  Constitutional:      General: She is not in acute distress.    Appearance: Normal appearance. She is obese. She is not ill-appearing.  HENT:     Head: Normocephalic and atraumatic.  Cardiovascular:     Rate and Rhythm: Normal rate and regular rhythm.     Pulses: Normal pulses.     Heart sounds: Normal heart sounds.  Pulmonary:     Effort: Pulmonary effort is normal.     Breath sounds: Normal breath sounds.  Abdominal:     General: Abdomen is flat. Bowel sounds are normal.     Palpations: Abdomen is soft.     Tenderness: There is no right CVA tenderness or left CVA tenderness.  Skin:    General: Skin is warm and dry.     Findings: Rash (impetigo skin lateral to R eye) present.  Neurological:     General: No focal deficit present.     Mental Status: She is alert.  Psychiatric:        Mood and Affect: Mood normal.  Rohith Fauth M Berry Godsey, PA-C

## 2024-02-24 LAB — HIV ANTIBODY (ROUTINE TESTING W REFLEX)
HIV 1&2 Ab, 4th Generation: NONREACTIVE
HIV FINAL INTERPRETATION: NEGATIVE

## 2024-02-24 LAB — LITHIUM LEVEL: Lithium Lvl: 0.7 mmol/L (ref 0.6–1.2)

## 2024-02-24 LAB — HEPATITIS C ANTIBODY: Hepatitis C Ab: NONREACTIVE

## 2024-02-25 ENCOUNTER — Ambulatory Visit: Payer: Self-pay | Admitting: Physician Assistant

## 2024-02-28 ENCOUNTER — Encounter: Payer: Self-pay | Admitting: Physician Assistant

## 2024-02-28 NOTE — Telephone Encounter (Signed)
 Please see pt and advise recommendations. [facility]?

## 2024-05-28 ENCOUNTER — Ambulatory Visit: Admitting: Physician Assistant

## 2024-06-05 DIAGNOSIS — N201 Calculus of ureter: Secondary | ICD-10-CM | POA: Insufficient documentation

## 2024-06-05 DIAGNOSIS — N179 Acute kidney failure, unspecified: Secondary | ICD-10-CM | POA: Insufficient documentation

## 2024-06-05 DIAGNOSIS — Z79899 Other long term (current) drug therapy: Secondary | ICD-10-CM | POA: Insufficient documentation

## 2024-06-05 DIAGNOSIS — N390 Urinary tract infection, site not specified: Secondary | ICD-10-CM | POA: Insufficient documentation

## 2024-06-05 DIAGNOSIS — N139 Obstructive and reflux uropathy, unspecified: Secondary | ICD-10-CM | POA: Insufficient documentation

## 2024-06-05 DIAGNOSIS — F319 Bipolar disorder, unspecified: Secondary | ICD-10-CM | POA: Insufficient documentation

## 2024-06-05 DIAGNOSIS — E871 Hypo-osmolality and hyponatremia: Secondary | ICD-10-CM | POA: Insufficient documentation

## 2024-06-05 DIAGNOSIS — A419 Sepsis, unspecified organism: Secondary | ICD-10-CM | POA: Insufficient documentation

## 2024-06-06 DIAGNOSIS — A4151 Sepsis due to Escherichia coli [E. coli]: Secondary | ICD-10-CM | POA: Insufficient documentation

## 2024-06-09 DIAGNOSIS — E876 Hypokalemia: Secondary | ICD-10-CM | POA: Insufficient documentation

## 2024-06-10 ENCOUNTER — Telehealth: Payer: Self-pay | Admitting: *Deleted

## 2024-06-11 ENCOUNTER — Encounter: Payer: Self-pay | Admitting: Physician Assistant

## 2024-06-11 NOTE — Telephone Encounter (Signed)
 Called an left message with spouse because patient number was not working.

## 2024-06-13 ENCOUNTER — Ambulatory Visit: Admitting: Physician Assistant

## 2024-06-13 ENCOUNTER — Encounter: Payer: Self-pay | Admitting: Physician Assistant

## 2024-06-13 ENCOUNTER — Ambulatory Visit: Payer: Self-pay | Admitting: Physician Assistant

## 2024-06-13 VITALS — BP 104/68 | HR 82 | Temp 97.4°F | Ht 63.0 in | Wt 258.4 lb

## 2024-06-13 DIAGNOSIS — A419 Sepsis, unspecified organism: Secondary | ICD-10-CM

## 2024-06-13 DIAGNOSIS — F419 Anxiety disorder, unspecified: Secondary | ICD-10-CM

## 2024-06-13 DIAGNOSIS — Z23 Encounter for immunization: Secondary | ICD-10-CM

## 2024-06-13 DIAGNOSIS — N201 Calculus of ureter: Secondary | ICD-10-CM

## 2024-06-13 DIAGNOSIS — R300 Dysuria: Secondary | ICD-10-CM

## 2024-06-13 DIAGNOSIS — F314 Bipolar disorder, current episode depressed, severe, without psychotic features: Secondary | ICD-10-CM

## 2024-06-13 DIAGNOSIS — K449 Diaphragmatic hernia without obstruction or gangrene: Secondary | ICD-10-CM

## 2024-06-13 LAB — URINALYSIS, ROUTINE W REFLEX MICROSCOPIC
Bilirubin Urine: NEGATIVE
Hgb urine dipstick: NEGATIVE
Ketones, ur: NEGATIVE
Nitrite: NEGATIVE
Specific Gravity, Urine: 1.01 (ref 1.000–1.030)
Total Protein, Urine: 30 — AB
Urine Glucose: NEGATIVE
Urobilinogen, UA: 0.2 (ref 0.0–1.0)
pH: 7 (ref 5.0–8.0)

## 2024-06-13 LAB — COMPREHENSIVE METABOLIC PANEL WITH GFR
ALT: 12 U/L (ref 3–35)
AST: 16 U/L (ref 5–37)
Albumin: 4 g/dL (ref 3.5–5.2)
Alkaline Phosphatase: 78 U/L (ref 39–117)
BUN: 10 mg/dL (ref 6–23)
CO2: 31 meq/L (ref 19–32)
Calcium: 9.8 mg/dL (ref 8.4–10.5)
Chloride: 100 meq/L (ref 96–112)
Creatinine, Ser: 1.07 mg/dL (ref 0.40–1.20)
GFR: 67.74 mL/min
Glucose, Bld: 78 mg/dL (ref 70–99)
Potassium: 4.1 meq/L (ref 3.5–5.1)
Sodium: 134 meq/L — ABNORMAL LOW (ref 135–145)
Total Bilirubin: 0.5 mg/dL (ref 0.2–1.2)
Total Protein: 7.7 g/dL (ref 6.0–8.3)

## 2024-06-13 LAB — CBC WITH DIFFERENTIAL/PLATELET
Basophils Absolute: 0 10*3/uL (ref 0.0–0.1)
Basophils Relative: 0.4 % (ref 0.0–3.0)
Eosinophils Absolute: 0.3 10*3/uL (ref 0.0–0.7)
Eosinophils Relative: 3 % (ref 0.0–5.0)
HCT: 40.2 % (ref 36.0–46.0)
Hemoglobin: 13.3 g/dL (ref 12.0–15.0)
Lymphocytes Relative: 18.5 % (ref 12.0–46.0)
Lymphs Abs: 1.8 10*3/uL (ref 0.7–4.0)
MCHC: 33 g/dL (ref 30.0–36.0)
MCV: 90.7 fl (ref 78.0–100.0)
Monocytes Absolute: 0.8 10*3/uL (ref 0.1–1.0)
Monocytes Relative: 8.3 % (ref 3.0–12.0)
Neutro Abs: 6.8 10*3/uL (ref 1.4–7.7)
Neutrophils Relative %: 69.8 % (ref 43.0–77.0)
Platelets: 403 10*3/uL — ABNORMAL HIGH (ref 150.0–400.0)
RBC: 4.43 Mil/uL (ref 3.87–5.11)
RDW: 16 % — ABNORMAL HIGH (ref 11.5–15.5)
WBC: 9.7 10*3/uL (ref 4.0–10.5)

## 2024-06-13 MED ORDER — GABAPENTIN 300 MG PO CAPS: ORAL_CAPSULE | ORAL | 0 refills | Status: AC

## 2024-06-13 MED ORDER — SUCRALFATE 1 GM/10ML PO SUSP: 1.0000 g | Freq: Three times a day (TID) | ORAL | 0 refills | Status: AC

## 2024-06-13 MED ORDER — BUPROPION HCL ER (XL) 300 MG PO TB24: 300.0000 mg | ORAL_TABLET | Freq: Every morning | ORAL | 2 refills | Status: AC

## 2024-06-13 MED ORDER — LITHIUM CARBONATE 300 MG PO CAPS: 300.0000 mg | ORAL_CAPSULE | Freq: Three times a day (TID) | ORAL | 1 refills | Status: AC

## 2024-06-13 NOTE — Patient Instructions (Signed)
" °  VISIT SUMMARY: You had a follow-up visit after being treated for sepsis due to E. coli, which led to acute renal failure and septic shock. You are experiencing pain and nausea, difficulty eating, and urinary symptoms. We discussed your ongoing treatment and made adjustments to your medications.  YOUR PLAN: SEPSIS DUE TO URINARY TRACT INFECTION WITH LEFT URETERAL STONE AND ACUTE RENAL FAILURE: You were recently hospitalized for sepsis caused by a urinary tract infection with a left ureteral stone, which led to acute renal failure and septic shock. You have a stent placed and are experiencing urinary symptoms. -Continue taking Rocephin  as prescribed. -We rechecked your urine analysis and culture. -Follow up with your urologist on February 13th for stent evaluation and potential removal.  HIATAL HERNIA WITH ESOPHAGITIS: You have a chronic hiatal hernia with esophagitis, causing pain and difficulty swallowing. -Take Carafate  liquid or tablets to coat your esophagus and reduce symptoms. -Continue taking omeprazole  and famotidine  as prescribed. -You need to schedule a follow-up with your gastroenterologist, Dr. Stacia.  BIPOLAR 1 DISORDER, DEPRESSED, SEVERE: You are currently on lithium  and gabapentin  for bipolar disorder. We discussed increasing your Wellbutrin  dose to help with depression. -Increase Wellbutrin  to 300 mg daily. If you experience increased anxiety, you can take it every other day. -Continue taking lithium  and gabapentin  as prescribed. -Follow with psychiatry   GENERAL HEALTH MAINTENANCE: You are due for tetanus and flu vaccinations. -You received the tetanus vaccine in your left arm. -You received the flu vaccine in your right arm.    Contains text generated by [employer].   "

## 2024-06-13 NOTE — Progress Notes (Signed)
 "   Patient ID: Faith Beck, female    DOB: 07-11-1990, 34 y.o.   MRN: [id number]   Assessment & Plan:  Sepsis due to urinary tract infection (HCC) -     CBC with Differential/Platelet -     Comprehensive metabolic panel with GFR -     Urinalysis, Routine w reflex microscopic -     Urine Culture  Dysuria -     Urinalysis, Routine w reflex microscopic -     Urine Culture  Left ureteral stone -     Urinalysis, Routine w reflex microscopic  Immunization due -     Tdap vaccine greater than or equal to 7yo IM -     Flu vaccine trivalent PF, 6mos and older(Flulaval,Afluria,Fluarix,Fluzone)  Anxiety and depression -     buPROPion  HCl ER (XL); Take 1 tablet (300 mg total) by mouth in the morning.  Dispense: 30 tablet; Refill: 2 -     Lithium  Carbonate; Take 1 capsule (300 mg total) by mouth 3 (three) times daily with meals.  Dispense: 270 capsule; Refill: 1 -     Gabapentin ; TAKE 1 CAPSULE(300 MG) BY MOUTH THREE TIMES DAILY  Dispense: 90 capsule; Refill: 0  Bipolar 1 disorder, depressed, severe (HCC) -     Lithium  Carbonate; Take 1 capsule (300 mg total) by mouth 3 (three) times daily with meals.  Dispense: 270 capsule; Refill: 1  Hiatal hernia with GERD and esophagitis -     Sucralfate ; Take 10 mLs (1 g total) by mouth 4 (four) times daily -  with meals and at bedtime.  Dispense: 420 mL; Refill: 0      Assessment and Plan Assessment & Plan Sepsis due to urinary tract infection with left ureteral stone and acute renal failure Recent hospitalization for sepsis secondary to E. coli infection with acute renal failure and septic shock. Left ureteral stone present with stent placement. No fever since hospital discharge. Concerns about potential infection or stone-related symptoms persist. - Continue Rocephin  as prescribed. - Rechecked urine analysis and culture. - Follow up with urology on February 13th for stent evaluation and potential removal.  Hiatal hernia with  esophagitis Chronic hiatal hernia with esophagitis. Symptoms include dysphagia and odynophagia, exacerbated by swallowing. Previous endoscopy in May 2024 showed a 4 cm hiatal hernia and esophagitis without bleeding. Current symptoms may be related to hiatal hernia or esophagitis. - Prescribed Carafate  liquid or tablets to coat the esophagus and reduce symptoms. - Continue omeprazole  and famotidine  as prescribed. - Pt will schedule follow-up with gastroenterologist Dr. Stacia.  Bipolar 1 disorder, depressed, severe Currently on lithium  and gabapentin . Wellbutrin  dose is 150 mg, with consideration to increase to 300 mg. No current anxiety symptoms reported. Discussed potential for increased anxiety with higher Wellbutrin  dose and suggested taking it every other day if needed. - Increased Wellbutrin  to 300 mg daily, with option to take every other day if anxiety increases. - Continue lithium  and gabapentin  as prescribed. - Needs f/up with psychiatry, transportation limited at this time.   General health maintenance Due for tetanus and flu vaccinations. - Administered tetanus vaccine in the left arm. - Administered flu vaccine in the right arm.    Medications reconciled today.   Return in about 8 weeks (around 08/08/2024) for recheck/follow-up.    Subjective:    Chief Complaint  Patient presents with   Hospitalization Follow-up    Pt in office for hospital follow up since recent admit for Sepsis and renal failure; pt is  feeling better but still not 100 percent, pt had stent placed due to kidney stone and still not passed the stone as of yet;     HPI Discussed the use of AI scribe software for clinical note transcription with the patient, who gave verbal consent to proceed.  History of Present Illness Faith Beck is a 34 year old female who presents for a hospital follow-up after being treated for sepsis due to E. coli with acute renal failure and septic shock.  She was  admitted to [facility] on June 05, 2024, due to sepsis caused by E. coli, which led to acute renal failure and septic shock. She also had a left ureteral stone and was discharged on June 08, 2024, after a three-day hospital stay.  Since discharge, she has been experiencing significant pain and nausea, with difficulty eating as swallowing causes pain, described as feeling like 'something was stabbing' her. Solid foods and even water cause discomfort, and she has been consuming mostly applesauce. She has a history of a 4 cm hiatal hernia and esophagitis, diagnosed in May 2024, and has been taking omeprazole  and famotidine  since her hospital discharge, but her symptoms persist.  She has not passed the ureteral stone and currently has a stent placed on the left side. She is scheduled to see her urologist, Dr. Cherly, on June 21, 2024, for a follow-up. She reports urinary symptoms including pain during urination and a sensation of incomplete bladder emptying. She ran out of her antibiotics two days ago and has noticed a recurrence of urinary symptoms.  She has not experienced fever since her discharge, with the last low-grade fever occurring on Tuesday, June 11, 2024.  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 She has a history of homelessness and is currently staying in a hotel, hoping to secure more permanent housing soon.  She is currently taking omeprazole , famotidine , lithium , gabapentin , and Wellbutrin . She has a few doses left of her lithium  and gabapentin .     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)    Migraine without status migrainosus    Ulcer 2018    Past  Surgical History:  Procedure Laterality Date   ABDOMINAL HYSTERECTOMY     still has L ovary   ABDOMINAL HYSTERECTOMY  2021   fibroids, endometriosis   CESAREAN SECTION     x3   CHOLECYSTECTOMY N/A 02/19/2015   Procedure: LAPAROSCOPIC CHOLECYSTECTOMY WITH INTRAOPERATIVE CHOLANGIOGRAM;  Surgeon: Krystal Spinner, MD;  Location: [facility];  Service: General;  Laterality: N/A;   DILATION AND CURETTAGE OF UTERUS     TUBAL LIGATION      Family History  Problem Relation Age of Onset   Asthma Mother    COPD Mother    Fibromyalgia Mother    Hypertension Mother    Hypertension Father    Cancer Father        leukemia   Diabetes Father    Heart disease Father    Pancreatic cancer Father    Alcohol abuse Father    Anxiety disorder Father    Drug abuse Father    Migraines Brother  Alcohol abuse Brother    Asthma Daughter    ADD / ADHD Daughter    Anxiety disorder Daughter    Learning disabilities Daughter    Healthy Son    ADD / ADHD Son    Intellectual disability Son    Learning disabilities Son    Anxiety disorder Maternal Grandmother    Heart disease Maternal Grandmother    Hypertension Maternal Grandmother    Anxiety disorder Maternal Aunt    Colon cancer Neg Hx    Esophageal cancer Neg Hx    Rectal cancer Neg Hx    Stomach cancer Neg Hx     Social History[1]   Allergies[2]  Review of Systems NEGATIVE UNLESS OTHERWISE INDICATED IN HPI      Objective:     BP 104/68 (BP Location: Left Arm, Patient Position: Sitting, Cuff Size: Normal)   Pulse 82   Temp (!) 97.4 F (36.3 C) (Temporal)   Ht 5' 3 (1.6 m)   Wt 258 lb 6.4 oz (117.2 kg)   LMP 05/23/2018   SpO2 99%   BMI 45.77 kg/m   Wt Readings from Last 3 Encounters:  06/13/24 258 lb 6.4 oz (117.2 kg)  02/23/24 267 lb (121.1 kg)  07/27/23 255 lb (115.7 kg)    BP Readings from Last 3 Encounters:  06/13/24 104/68  02/23/24 (!) 144/89  09/19/23 122/82     Physical Exam Vitals and nursing note reviewed.   Constitutional:      General: She is not in acute distress.    Appearance: Normal appearance. She is obese. She is not ill-appearing.  HENT:     Head: Normocephalic and atraumatic.  Cardiovascular:     Rate and Rhythm: Normal rate and regular rhythm.     Pulses: Normal pulses.     Heart sounds: Normal heart sounds.  Pulmonary:     Effort: Pulmonary effort is normal.     Breath sounds: Normal breath sounds.  Abdominal:     General: Abdomen is flat. Bowel sounds are normal.     Palpations: Abdomen is soft.     Tenderness: There is left CVA tenderness (mild). There is no right CVA tenderness.  Skin:    General: Skin is warm and dry.  Neurological:     General: No focal deficit present.     Mental Status: She is alert.  Psychiatric:        Mood and Affect: Mood normal.             Time Spent: 38 minutes of total time was spent on the date of the encounter performing the following actions: chart review prior to seeing the patient, obtaining history, performing a medically necessary exam, counseling on the treatment plan, placing orders, and documenting in our EHR.   Faith Kidane M Adilen Pavelko, PA-C     [1]  Social History Tobacco Use   Smoking status: Former    Current packs/day: 0.00    Types: Cigarettes    Quit date: 06/11/2015    Years since quitting: 9.0   Smokeless tobacco: Never  Vaping Use   Vaping status: Never Used  Substance Use Topics   Alcohol use: Yes    Comment: rare once a month   Drug use: Yes    Types: Marijuana    Comment: occ  [2] No Known Allergies  "

## 2024-06-14 ENCOUNTER — Telehealth: Payer: Self-pay

## 2024-06-14 ENCOUNTER — Other Ambulatory Visit: Payer: Self-pay | Admitting: Physician Assistant

## 2024-06-14 LAB — URINE CULTURE
MICRO NUMBER:: 17553686
Result:: NO GROWTH
SPECIMEN QUALITY:: ADEQUATE

## 2024-06-14 MED ORDER — NITROFURANTOIN MONOHYD MACRO 100 MG PO CAPS: 100.0000 mg | ORAL_CAPSULE | Freq: Two times a day (BID) | ORAL | 0 refills | Status: AC

## 2024-06-14 NOTE — Telephone Encounter (Signed)
 Pt called in regarding urine lab completed and wanting to due to it being abnormal does she have infection and need to start an antibiotic before the weekend. Please advise

## 2024-06-14 NOTE — Telephone Encounter (Signed)
 Provider sent in Macrobid  to start over the weekend until culture results are back and resulted. Pt has been made aware via MyChart message

## 2024-08-13 ENCOUNTER — Ambulatory Visit: Admitting: Physician Assistant
